# Patient Record
Sex: Female | Born: 1940 | Race: White | Hispanic: Yes | Smoking: Never smoker
Health system: Southern US, Community
[De-identification: ages and names within clinical notes are randomized; demographics above are authoritative.]

## PROBLEM LIST (undated history)

## (undated) DIAGNOSIS — I10 Essential (primary) hypertension: Secondary | ICD-10-CM

## (undated) DIAGNOSIS — E079 Disorder of thyroid, unspecified: Secondary | ICD-10-CM

## (undated) DIAGNOSIS — I5032 Chronic diastolic (congestive) heart failure: Secondary | ICD-10-CM

## (undated) DIAGNOSIS — E785 Hyperlipidemia, unspecified: Secondary | ICD-10-CM

## (undated) DIAGNOSIS — F0390 Unspecified dementia without behavioral disturbance: Secondary | ICD-10-CM

## (undated) DIAGNOSIS — I480 Paroxysmal atrial fibrillation: Secondary | ICD-10-CM

## (undated) HISTORY — DX: Hyperlipidemia, unspecified: E78.5

## (undated) HISTORY — DX: Disorder of thyroid, unspecified: E07.9

## (undated) HISTORY — DX: Essential (primary) hypertension: I10

---

## 2017-10-06 ENCOUNTER — Encounter: Payer: Self-pay | Admitting: Family Medicine

## 2017-11-29 ENCOUNTER — Encounter: Payer: Self-pay | Admitting: Family Medicine

## 2017-11-29 ENCOUNTER — Ambulatory Visit (INDEPENDENT_AMBULATORY_CARE_PROVIDER_SITE_OTHER): Payer: Medicare Other | Admitting: Family Medicine

## 2017-11-29 VITALS — BP 124/56 | Temp 97.4°F | Ht 60.0 in | Wt 94.0 lb

## 2017-11-29 DIAGNOSIS — I10 Essential (primary) hypertension: Secondary | ICD-10-CM

## 2017-11-29 DIAGNOSIS — Z7689 Persons encountering health services in other specified circumstances: Secondary | ICD-10-CM

## 2017-11-29 DIAGNOSIS — F339 Major depressive disorder, recurrent, unspecified: Secondary | ICD-10-CM

## 2017-11-29 DIAGNOSIS — E440 Moderate protein-calorie malnutrition: Secondary | ICD-10-CM

## 2017-11-29 DIAGNOSIS — E785 Hyperlipidemia, unspecified: Secondary | ICD-10-CM

## 2017-11-29 DIAGNOSIS — G309 Alzheimer's disease, unspecified: Secondary | ICD-10-CM

## 2017-11-29 DIAGNOSIS — E039 Hypothyroidism, unspecified: Secondary | ICD-10-CM

## 2017-11-29 DIAGNOSIS — F0280 Dementia in other diseases classified elsewhere without behavioral disturbance: Secondary | ICD-10-CM

## 2017-11-29 MED ORDER — METOPROLOL TARTRATE 50 MG PO TABS: 50.0000 mg | ORAL_TABLET | Freq: Every day | ORAL | 3 refills | Status: DC

## 2017-11-29 MED ORDER — LEVOTHYROXINE SODIUM 88 MCG PO TABS: 88.0000 ug | ORAL_TABLET | Freq: Every day | ORAL | 3 refills | Status: DC

## 2017-11-29 MED ORDER — SERTRALINE HCL 50 MG PO TABS: 50.0000 mg | ORAL_TABLET | Freq: Every day | ORAL | 3 refills | Status: DC

## 2017-11-29 MED ORDER — DONEPEZIL HCL 10 MG PO TABS: 10.0000 mg | ORAL_TABLET | Freq: Every day | ORAL | 3 refills | Status: DC

## 2017-11-29 MED ORDER — MEMANTINE HCL 5 MG PO TABS: 5.0000 mg | ORAL_TABLET | Freq: Two times a day (BID) | ORAL | 3 refills | Status: AC

## 2017-11-29 NOTE — Progress Notes (Signed)
Patient presents to clinic today to follow-up on chronic conditions and establish care.  Pt's primary language is [place].  She understands some [place].  No translator present.  Pt's granddaughter aiding w/ communication at times.  SUBJECTIVE: PMH:  Pt is a 77 yo female with pmh sig for Alzheimer's dementia, hypothyroidism, hyperlipidemia, hypertension, depression, malnutrition.  Pt was previously seen by Dr.Fernande Alesia Richards in [place], [place]  Pt is accompanied by her granddaughter who recently got custody of pt.    Dementia: -Alzheimer's per note from previous PCP -On Aricept and Namenda -Behavior becoming a concern for pt's granddaughter, considering nursing facility placement -Pt eating nonstop, forgetting she just ate -Granddaughter has put a lock on the refrigerator  Malnutrition: -Patient was 80 something pounds when her granddaughter started caring for her. -There was concern for patient not eating while in [place] -Patient now eating all day, as she forgets she is just eaten.  Hypothyroidism: -Taking Synthroid 88 mcg daily  Depression: -Taking sertraline 50 mg daily -Endorses okay mood  HTN: -Taking metoprolol succinate 50 mg daily  Allergies: NKDA  Past Medical History:  Diagnosis Date  . Hyperlipidemia   . Hypertension   . Thyroid disease     History reviewed. No pertinent surgical history.  No current outpatient medications on file prior to visit.   No current facility-administered medications on file prior to visit.     No Known Allergies  Family History  Problem Relation Age of Onset  . Heart disease Mother   . Mental illness Mother   . Stroke Father   . Mental illness Brother   . Heart disease Daughter   . Alcohol abuse Son     Social History   Socioeconomic History  . Marital status: Widowed    Spouse name: Not on file  . Number of children: Not on file  . Years of education: Not on file  . Highest education level:  Not on file  Occupational History  . Not on file  Social Needs  . Financial resource strain: Not on file  . Food insecurity:    Worry: Not on file    Inability: Not on file  . Transportation needs:    Medical: Not on file    Non-medical: Not on file  Tobacco Use  . Smoking status: Never Smoker  . Smokeless tobacco: Never Used  Substance and Sexual Activity  . Alcohol use: Never    Frequency: Never  . Drug use: Never  . Sexual activity: Not Currently  Lifestyle  . Physical activity:    Days per week: Not on file    Minutes per session: Not on file  . Stress: Not on file  Relationships  . Social connections:    Talks on phone: Not on file    Gets together: Not on file    Attends religious service: Not on file    Active member of club or organization: Not on file    Attends meetings of clubs or organizations: Not on file    Relationship status: Not on file  . Intimate partner violence:    Fear of current or ex partner: Not on file    Emotionally abused: Not on file    Physically abused: Not on file    Forced sexual activity: Not on file  Other Topics Concern  . Not on file  Social History Narrative  . Not on file    ROS General: Denies fever, chills, night sweats, changes in  appetite  +memory deficit, decreased weight HEENT: Denies headaches, ear pain, changes in vision, rhinorrhea, sore throat  CV: Denies CP, palpitations, SOB, orthopnea Pulm: Denies SOB, cough, wheezing GI: Denies abdominal pain, nausea, vomiting, diarrhea, constipation GU: Denies dysuria, hematuria, frequency, vaginal discharge Msk: Denies muscle cramps, joint pains Neuro: Denies weakness, numbness, tingling Skin: Denies rashes, bruising Psych: Denies anxiety, hallucinations  + depression  BP (!) 124/56   Temp (!) 97.4 F (36.3 C) (Oral)   Ht 5' (1.524 m)   Wt 94 lb (42.6 kg)   BMI 18.36 kg/m   Physical Exam Gen. Pleasant, well developed, malnourished, in NAD HEENT - /AT, temporal  wasting, PERRL, no scleral icterus, no nasal drainage, pharynx without erythema or exudate.  TMs normal bilaterally.  No cervical lymphadenopathy. Lungs: no use of accessory muscles, CTAB, no wheezes, rales or rhonchi Cardiovascular: RRR, No r/g/m, no peripheral edema Abdomen: BS present, soft, nontender,nondistended Musculoskeletal: Muscle wasting of the dorsum of hands, no deformities, moves all four extremities, no cyanosis or clubbing, normal tone Neuro:  A&Ox3, CN II-XII intact, normal gait Skin:  Warm, dry, intact, no lesions  No results found for this or any previous visit (from the past 2160 hour(s)).  Assessment/Plan:  A handwritten note from patient's previous provider reviewed.  Dr. Delma FreezeFerdinand Marin Rivera.  [facility] [address],  [place], [place] [id number]  [phone]  Alzheimer's dementia without behavioral disturbance, unspecified timing of dementia onset (HCC) -Discussed the importance of his schedule/routine -Will complete FL2 for pt -Given handouts - Plan: donepezil (ARICEPT) 10 MG tablet, memantine (NAMENDA) 5 MG tablet  Acquired hypothyroidism  -Recent labs reviewed and copies made. -Continue Synthroid 88 mcg - Plan: levothyroxine (SYNTHROID, LEVOTHROID) 88 MCG tablet  Hyperlipidemia, unspecified hyperlipidemia type  Essential hypertension - Plan: metoprolol tartrate (LOPRESSOR) 50 MG tablet  Depression, recurrent (HCC)  - Plan: sertraline (ZOLOFT) 50 MG tablet  Moderate protein-calorie malnutrition (HCC) -Discussed giving patient several small meals per day. -Given handout  Encounter to establish care -We reviewed the PMH, PSH, FH, SH, Meds and Allergies. -We provided refills for any medications we will prescribe as needed. -We addressed current concerns per orders and patient instructions. -We have asked for records for pertinent exams, studies, vaccines and notes from previous providers. -We have advised patient to follow up per instructions  below.  Follow-up in the next 1 to 2 months  Abbe AmsterdamShannon Nima Kemppainen, MD

## 2017-11-29 NOTE — Patient Instructions (Addendum)
Gua para cuidadores de [facility] con enfermedad de Alzheimer  Alzheimer Disease Caregiver Guide Alzheimer disease is a brain disease that causes memory loss and changes in behavior. People with Alzheimer disease often have problems paying attention, communicating, and doing routine tasks. The disease gets worse over time, and people with the disease eventually need full-time care. Taking care of someone with Alzheimer disease can be challenging and overwhelming. This guide provides helpful information and tips that can make caring for someone with the disease a little easier. What changes does Alzheimer disease cause? Alzheimer disease causes a person to lose the ability to remember things and make decisions. Memory loss and confusion are usually mild at the start of the disease, but they get more severe over time. Eventually, the person may not recognize friends, family members, or familiar places. Alzheimer disease can also cause hallucinations, changes in behavior, and changes in mood, such as anxiety or anger. The changes can come on suddenly. They may happen in response to something such as:  Pain.  An infection.  Changes in environment, such as changes in temperature or noise.  Overstimulation.  Feeling lost or scared.  Tips for managing symptoms  Be calm and patient.  Give short, simple answers to questions. Long answers can overwhelm and confuse the person.  Avoid correcting the person in a negative way.  Try not to take things personally, even if the person forgets your name. Understand that changes are a part of the disease process.  Do not argue or try to convince the person about a specific point. Doing that may make the person feel more agitated. Tips for reducing frustration  Make appointments and do daily tasks, like bathing and dressing, when the person is at his or her best.  Allow for plenty of time for simple tasks because they may take longer than expected. Take  your time when doing these tasks.  Limit the person's choices. Too many choices can be overwhelming and stressful for the person.  Involve the person in what you are doing.  Keep a daily routine.  Avoid crowds and new situations, if possible.  Use simple words, short sentences, and a calm voice. Only give one direction at a time.  Buy clothes and shoes that are easy to put on and take off.  Organize medications in a pillbox for each day of the week.  Keep a calendar in a central location to remind the person of appointments or other activities.  Ask about respite care resources so that you can have a regular break from the stress of caregiving. Tips for reducing the risk of injury  Keep floors clear of clutter. Remove rugs, magazine racks, and floor lamps.  Keep hallways well-lit, especially at night.  Put a handrail and nonslip mat in the bathtub or shower.  Put childproof locks on cabinets that contain dangerous items, such as medicines, alcohol, guns, toxic cleaning items, sharp tools or utensils, matches, and lighters.  Put locks on doors. Put the locks in places where the person cannot see or reach them easily. This will help ensure that the person does not wander out of the house and get lost.  Be prepared for emergencies. Keep a list of emergency phone numbers and addresses in a convenient area.  Remove car keys and lock garage doors so that the person does not try to get in the car and drive.  A certain type of bracelet may be worn that tracks a person's location and identifies him  or her as having memory problems. This should be worn at all times for safety. Tips for future planning  Discuss financial and legal planning early on in the course of the disease. People with Alzheimer disease will have trouble managing their money as the disease gets worse. Get help from professional advisers regarding financial and legal matters.  Discuss advance directives, safety, and  daily care. Take these steps: ? Create a living will and choose a power of attorney. The person with power of attorney will be able to make decisions for the person with Alzheimer disease when he or she is no longer able to. ? Discuss driving safety and when to stop driving. The person's health care provider can help provide assistance with this decision. ? Discuss the person's living situation. If the person lives alone, make sure he or she is safe. People who live at home may need extra help from home health caregivers, and those who live in a nursing home or care center may need more care. Where to find support: One way to find support is to join a local support group. Advantages of being part of a support group include:  Learning strategies to manage stress.  Sharing experiences with others.  Receiving emotional comfort and support.  Learning about caregiving as the disease progresses.  Knowing what community resources are available and making use of them.  Where to find more information:  Alzheimer's Association: LimitLaws.hu Contact a health care provider if:  The person has a fever.  The person has a sudden change in behavior that does not improve with calming strategies.  The person is unable to manage in his or her current living situation.  The person threatens himself or herself, you, or anyone else.  You are no longer able to care for the person. Summary  Alzheimer disease is a brain disease that causes memory loss and changes in behavior.  People with Alzheimer disease often have problems paying attention, communicating, and doing routine tasks. The disease gets worse over time, and people with the disease eventually need full-time care.  Take steps to reduce the person's risk of injury, and plan for future care.  Taking care of someone with Alzheimer disease can be very challenging and overwhelming. One way to find support during this time is to join a local  support group. This information is not intended to replace advice given to you by your health care provider. Make sure you discuss any questions you have with your health care provider. Document Released: 09/15/2003 Document Revised: 02/05/2016 Document Reviewed: 02/05/2016 Elsevier Interactive Patient Education  2018 [employer].  Alzheimer Disease Alzheimer disease is a brain disease that affects memory, thinking, and behavior. People with Alzheimer disease lose mental abilities, and the disease gets worse over time. Survival with Alzheimer disease ranges from several years to as long as 20 years. What are the causes? This condition develops when a protein called beta-amyloid forms deposits in the brain. It is not known what causes these deposits to form. What increases the risk? This condition is more likely to develop in people who:  Are elderly.  Have a family history of dementia.  Have had a brain injury.  Have heart or blood vessel disease.  Have had a stroke.  Have high blood pressure or high cholesterol.  Have diabetes.  What are the signs or symptoms? Symptoms of this condition happen in three stages, which often overlap. Early stage In this stage, you may continue to be independent.  You may still be able to drive, work, and be social. Symptoms in this stage include:  Minor memory problems, such as forgetting a name or what you read.  Difficulty with: ? Paying attention. ? Communicating. ? Doing familiar tasks. ? Learning new things.  Needing more time to do daily activities.  Anxiety.  Social withdrawal.  Loss of motivation.  Moderate stage In this stage, you will start to need care. This stage usually lasts the longest. Symptoms in this stage include:  Difficulty with expressing thoughts.  Memory loss that affects daily life. This can include forgetting: ? Your address or phone number. ? Events that have happened. ? Parts of your personal history,  like where you went to school.  Confusion about where you are or what time it is.  Difficulty in judging distance.  Changes in personality, mood, and behavior. You may be moody, irritable, angry, frustrated, fearful, anxious, or suspicious.  Poor reasoning and judgment.  Delusions or hallucinations.  Changes in sleep patterns.  Wandering and getting lost.  Severe stage In the final stage, you will need help with your personal care and dailyactivities. Symptoms in this stage include:  Worsening memory loss.  Personality changes.  Loss of awareness of your surroundings.  Changes in physical abilities, including the ability to walk, sit, and swallow.  Difficulty in communicating.  Inability to control the bladder and bowels.  Increasing confusion.  Increasing disruptive behavior.  How is this diagnosed? This condition is diagnosed with an assessment by your health care provider. During this assessment, your health care provider will talk with you and your family, friends, or caregivers about your symptoms. A thorough medical history will be taken, and you will have a physical exam and tests. Tests may include:  Lab tests, such as blood or urine tests.  Imaging tests, such as a CT scan, PET scan, or MRI.  A lumbar puncture. This test involves removing and testing a small amount of the fluid that surrounds the brain and spinal cord.  An electroencephalogram (EEG). In this test, small metal discs are used to measure electrical activity in the brain.  Memory tests, cognitive tests, and neuropsychological tests. These tests evaluate brain function.  How is this treated? At this time, there is no treatment to cure Alzheimer disease or stop it from getting worse. The goals of treatment are:  To slow down the disease.  To manage behavioral problems.  To provide you with a safe environment.  To make life easier for you and your caregivers.  The following treatment  options are available:  Medicines. Medicines may help to slow down memory loss and control behavioral symptoms.  Talk therapy. Talk therapy provides you with education, support, and memory aids. It is most helpful in the early stages of the condition.  Counseling or spiritual guidance. It is normal to have a lot of feelings, including anger, relief, fear, and isolation. Counseling and guidance can help you deal with these feelings.  Caregiving. This involves having caregivers help you with your daily activities. Caregivers may be family members, friends, or trained medical professionals. Caregiving can be done at home or outside the home.  Family support groups. These provide education, emotional support, and information about community resources to family members who are taking care of you.  Follow these instructions at home: Medicines  Take over-the-counter and prescription medicines only as told by your health care provider.  Avoid taking medicines that can affect thinking, such as pain or sleeping medicines.  Lifestyle   Make healthy lifestyle choices: ? Be physically active as told by your health care provider. ? Do not use any tobacco products, such as cigarettes, chewing tobacco, and e-cigarettes. If you need help quitting, ask your health care provider. ? Eat a healthy diet. ? Practice stress-management techniques when you get stressed. ? Stay social.  Drink enough fluid to keep your urine clear or pale yellow.  Make sure to get quality sleep. These tips can help you get a good night's rest: ? Avoid napping during the day. ? Keep your sleeping area dark and cool. ? Avoid exercising during the few hours before you go to bed. ? Avoid caffeine products in the evening. General instructions  Work with your health care provider to determine what you need help with and what your safety needs are.  If you were given a bracelet that tracks your location, make sure to wear  it.  Keep all follow-up visits as told by your health care provider. This is important.  If you have questions or would like additional support, you may contact The Alzheimer's Association: ? 24-hour helpline: 276 514 4663 ? Website: LimitLaws.hu Contact a health care provider if:  You have nausea, vomiting, or trouble with eating.  You have dizziness, or weakness.  You have new or worsening trouble with sleeping.  You or your family members become concerned for your safety. Get help right away if:  You develop chest pain or difficulty with breathing.  You pass out. This information is not intended to replace advice given to you by your health care provider. Make sure you discuss any questions you have with your health care provider. Document Released: 09/15/2003 Document Revised: 09/04/2015 Document Reviewed: 10/01/2014 Elsevier Interactive Patient Education  2018 [employer]. Malnutrition Malnutrition is any condition in which nutrition is poor. There are many forms of malnutrition. A common form is having too little of one kind of nutrient (nutritional deficiency). Nutrients include proteins, minerals, carbohydrates, fats, and vitamins. They provide the body with energy and keep the body working normally. Malnutrition ranges from mild to severe. The condition affects the body's defense system (immune system). Because of this, people who are malnourished are more likely to develop health problems and get sick. What are the causes? Causes of malnutrition include:  Eating an unbalanced diet.  Eating too much of certain foods.  Eating too little.  Conditions that decrease the body's ability to use nutrients.  What increases the risk? Risk factors include:  Pregnancy and lactation. Women who are pregnant may become malnourished if they do not increase their nutrient intake. They are also susceptible to folic acid deficiency.  Increasing age. The body's ability to absorb  nutrients decreases with age. This can contribute to iron, calcium, and vitamin D deficiencies.  Alcohol or drug dependency. Addiction often leads to a lifestyle in which proper nourishment is ignored. Dependency can also hurt the metabolism and the body's ability to absorb nutrients. Alcoholism is a major cause of thiamine deficiency and can lead to deficiencies of magnesium, zinc, and other vitamins.  Eating disorders, such as anorexia nervosa. People with these disorders may eat too little or too much.  Chewing or swallowing problems. People with these disorders may not eat enough.  Certain diseases, including: ? Long-lasting (chronic) diseases. Chronic diseases tend to affect the absorption of calcium, iron, and vitamins B12, A, D, E, and K. ? Liver disease. Liver disease affects the storage of vitamins A and B12. It also interferes with the  metabolism of protein and energy sources. ? Kidney disease. Kidney disease may cause deficiencies of protein, iron, and vitamin D. ? Cancer or AIDS. These diseases can cause a loss of appetite. ? Cystic fibrosis. This disease can make it difficult for the body to absorb nutrients.  Certain diets, including. ? The vegetarian diet. Vegetarians are at risk for iron deficiency. ? The vegan diet. Vegans are susceptible to vitamin B12, calcium, iron, vitamin D, and zinc deficiencies. ? The fruitarian diet. This diet can be deficient in protein, sodium, and many micronutrients. ? Many commercial "fad" diets, including those that claim to enhance well-being and reduce weight. ? Very low calorie diets.  Low income. People with a low income may have trouble paying for nutritious foods.  What are the signs or symptoms? Signs and symptoms depend on the kind of malnutrition you have. Common symptoms include:  Fatigue.  Weakness.  Dizziness.  Fainting  Weight loss.  Poor immune response.  Lack of menstruation.  Hair loss.  Poor memory.  How is  this diagnosed? Malnutrition may be diagnosed by:  A medical history.  A dietary history.  A physical exam. This may include a measurement of your body mass index (BMI).  Blood tests.  How is this treated? Treatments vary depending on the cause of the malnutrition. Common treatments include:  Dietary changes.  Dietary supplements, such as vitamins and minerals.  Treatment of any underlying conditions.  Follow these instructions at home:  Eat a balanced diet.  Take dietary supplements as directed by your health care provider.  Exercise regularly. Exercising can improve appetite.  Keep all follow-up visits as directed by your health care provider. This is important. How is this prevented? Eating a well-balanced diet helps to prevent most forms of malnutrition. Contact a health care provider if:  You have increased weakness or fatigue.  You faint.  You stop menstruating.  You have rapid hair loss.  You have unexpected weight loss. This information is not intended to replace advice given to you by your health care provider. Make sure you discuss any questions you have with your health care provider. Document Released: 11/19/2004 Document Revised: 06/11/2015 Document Reviewed: 08/30/2013 Elsevier Interactive Patient Education  [employer].

## 2017-12-04 ENCOUNTER — Encounter: Payer: Self-pay | Admitting: Family Medicine

## 2017-12-06 ENCOUNTER — Telehealth: Payer: Self-pay | Admitting: Family Medicine

## 2017-12-06 NOTE — Telephone Encounter (Signed)
Pt's granddaughter showed up at office this evening.  Form given to her.

## 2017-12-06 NOTE — Telephone Encounter (Signed)
11/20 10:20am Ann Cole is calling to follow up on FL2 form. She states she called 11/15 and 11/19 and no one has gotten back to her. [facility]. Form is needed before they will assess patient. Please call [phone].   Copied from CRM [phone]. Topic: General - Other >> Dec 05, 2017  9:00 AM Percival SpanishKennedy, Cheryl W wrote:  Kris MoutonGrand daughter calling to follow up on FL2 form

## 2017-12-06 NOTE — Telephone Encounter (Signed)
FYI: Spoke with pt grand daughter, explained that Dr Salomon FickBanks is in the process of completing the FL2 form and that I will be calling her when its complete, voiced understanding

## 2017-12-11 ENCOUNTER — Telehealth: Payer: Self-pay

## 2017-12-11 NOTE — Telephone Encounter (Signed)
Called pt grand daughter left a message that FL2 form is ready for pick up at the office

## 2017-12-11 NOTE — Telephone Encounter (Signed)
Called pt grand daughter [facility]

## 2017-12-13 ENCOUNTER — Telehealth: Payer: Self-pay

## 2017-12-13 NOTE — Telephone Encounter (Signed)
Pt grand[facility]

## 2017-12-22 ENCOUNTER — Ambulatory Visit: Payer: Self-pay | Admitting: *Deleted

## 2017-12-22 NOTE — Telephone Encounter (Addendum)
Granddaughter is calling with decreased BP. [facility]- BP 108/50. Patient was getting a TB skin test reading. Patient has lost a lot of weight. Offered Saturday appointment- then Monday appointment- then ED- do not know what granddaughter is going to do. She states she will call back to schedule. Told her we would be here to take her call.  Reason for Disposition . [1] Systolic BP 90-110 AND [2] taking blood pressure medications AND [3] NOT dizzy, lightheaded or weak  Answer Assessment - Initial Assessment Questions 1. BLOOD PRESSURE: "What is the blood pressure?" "Did you take at least two measurements 5 minutes apart?"     108/50 2. ONSET: "When did you take your blood pressure?"     3:20 pm 3. HOW: "How did you obtain the blood pressure?" (e.g., visiting nurse, automatic home BP monitor)     manual 4. HISTORY: "Do you have a history of low blood pressure?" "What is your blood pressure normally?"     Patient is under treatment for high BP 5. MEDICATIONS: "Are you taking any medications for blood pressure?" If yes: "Have they been changed recently?"     Yes- Metoprolol 50 mg  6. PULSE RATE: "Do you know what your pulse rate is?"      78 7. OTHER SYMPTOMS: "Have you been sick recently?" "Have you had a recent injury?"     No illness 8. PREGNANCY: "Is there any chance you are pregnant?" "When was your last menstrual period?"     n/a  Protocols used: LOW BLOOD PRESSURE-A-AH

## 2017-12-25 NOTE — Telephone Encounter (Signed)
Please advise 

## 2017-12-25 NOTE — Telephone Encounter (Signed)
Pt should increase po intake of fluids.  Offer pt an appt to be seen in clinic.

## 2017-12-27 NOTE — Telephone Encounter (Signed)
Called pt Granddaughter and left a detailed message with Dr Salomon FickBanks recommendations

## 2018-01-05 ENCOUNTER — Ambulatory Visit (INDEPENDENT_AMBULATORY_CARE_PROVIDER_SITE_OTHER): Payer: Medicare Other | Admitting: Family Medicine

## 2018-01-05 ENCOUNTER — Encounter: Payer: Self-pay | Admitting: Family Medicine

## 2018-01-05 VITALS — BP 110/56 | HR 84 | Temp 97.0°F | Wt 108.0 lb

## 2018-01-05 DIAGNOSIS — R600 Localized edema: Secondary | ICD-10-CM | POA: Diagnosis not present

## 2018-01-05 DIAGNOSIS — F0280 Dementia in other diseases classified elsewhere without behavioral disturbance: Secondary | ICD-10-CM | POA: Diagnosis not present

## 2018-01-05 DIAGNOSIS — I10 Essential (primary) hypertension: Secondary | ICD-10-CM | POA: Diagnosis not present

## 2018-01-05 DIAGNOSIS — G309 Alzheimer's disease, unspecified: Secondary | ICD-10-CM

## 2018-01-05 DIAGNOSIS — F0390 Unspecified dementia without behavioral disturbance: Secondary | ICD-10-CM | POA: Insufficient documentation

## 2018-01-05 MED ORDER — METOPROLOL TARTRATE 25 MG PO TABS: 25.0000 mg | ORAL_TABLET | Freq: Every day | ORAL | 3 refills | Status: DC

## 2018-01-05 MED ORDER — METOPROLOL SUCCINATE ER 25 MG PO TB24: 25.0000 mg | ORAL_TABLET | Freq: Every day | ORAL | 3 refills | Status: DC

## 2018-01-05 NOTE — Progress Notes (Signed)
Subjective:    Patient ID: Ann Cole Ann Cole, female    DOB: 1940/08/12, 77 y.o.   MRN: [id number]  No chief complaint on file.   HPI Patient was seen today for f/u on BP.  Pt taking metoprolol 50 mg daily.  Pt's granddaughter mention concern of hypotension.  BP was low when checked at pharmacy, cannot recall the reading.  Pt has been on Metoprolol for yrs.  Pt denies HAs, dizziness, CP, syncope.  Pt has been increasing her po intake.  [facility].  Pt's granddaughter picks her up on the wknds.  Per pt's granddaughter, pt walks around at night which is causing her legs to swell.  Past Medical History:  Diagnosis Date  . Hyperlipidemia   . Hypertension   . Thyroid disease     No Known Allergies  ROS General: Denies fever, chills, night sweats, changes in weight, changes in appetite   +bp concerns HEENT: Denies headaches, ear pain, changes in vision, rhinorrhea, sore throat CV: Denies CP, palpitations, SOB, orthopnea Pulm: Denies SOB, cough, wheezing GI: Denies abdominal pain, nausea, vomiting, diarrhea, constipation GU: Denies dysuria, hematuria, frequency, vaginal discharge Msk: Denies muscle cramps, joint pains  +LE edema Neuro: Denies weakness, numbness, tingling Skin: Denies rashes, bruising Psych: Denies depression, anxiety, hallucinations    Objective:    Blood pressure (!) 110/56, pulse 84, temperature (!) 97 F (36.1 C), weight 108 lb (49 kg), SpO2 98 %.  Gen. Pleasant, thin, in no distress, normal affect  HEENT: [place]/AT, face symmetric,no scleral icterus, PERRLA, nares patent without drainage, pharynx without erythema or exudate. Lungs: no accessory muscle use, CTAB, no wheezes or rales Cardiovascular: RRR, no m/r/g, no peripheral edema Neuro:  A&Ox3, CN II-XII intact, normal gait Skin:  Warm, no lesions/ rash  Wt Readings from Last 3 Encounters:  11/29/17 94 lb (42.6 kg)    No results found for: WBC, HGB, HCT, PLT, GLUCOSE,  CHOL, TRIG, HDL, LDLDIRECT, LDLCALC, ALT, AST, NA, K, CL, CREATININE, BUN, CO2, TSH, PSA, INR, GLUF, HGBA1C, MICROALBUR  Assessment/Plan:  Essential hypertension -given well controlled bp will decrease dose of metoprolol from 50 to 25 mg daily.  - Plan: metoprolol succinate 25 MG tablet daily. -letter noting above given to pt to [facility] -continue checking bp daily.  Bilateral lower extremity edema -Discussed importance of elevating feet when sitting, however this is difficult as patient often forgets 2/2 dementia -Discussed use of TED hose, however will likely be uncomfortable for patient. -Discussed decreasing sodium intake -Continue to monitor  Alzheimer's dementia without behavioral disturbance -Continue Namenda 5 mg twice daily and Aricept 10 mg nightly -Encouraged for patient to have a regular routine  Of note per chart review, pt has been on Metoprolol succinate, not tartrate.  Changes made to med list.  Follow-up in the next month  Abbe AmsterdamShannon Banks, MD

## 2018-01-05 NOTE — Patient Instructions (Signed)
At today's visit we discussed decreasing the dose of metoprolol from 50 to 25 mg daily.

## 2018-01-05 NOTE — Progress Notes (Signed)
Pt brought records on her TB Test results, TB testing was done at [facility] and result was Negative. Copy has been sent to scanning

## 2018-01-14 ENCOUNTER — Encounter: Payer: Self-pay | Admitting: Family Medicine

## 2018-08-01 ENCOUNTER — Emergency Department (HOSPITAL_COMMUNITY): Payer: Medicare Other

## 2018-08-01 ENCOUNTER — Other Ambulatory Visit: Payer: Self-pay

## 2018-08-01 ENCOUNTER — Emergency Department (HOSPITAL_COMMUNITY)
Admission: EM | Admit: 2018-08-01 | Discharge: 2018-08-01 | Disposition: A | Discharge status: Home / Self Care | Payer: Medicare Other | Attending: Emergency Medicine | Admitting: Emergency Medicine

## 2018-08-01 ENCOUNTER — Encounter (HOSPITAL_COMMUNITY): Payer: Self-pay | Admitting: Emergency Medicine

## 2018-08-01 DIAGNOSIS — F0390 Unspecified dementia without behavioral disturbance: Secondary | ICD-10-CM | POA: Diagnosis not present

## 2018-08-01 DIAGNOSIS — Z79899 Other long term (current) drug therapy: Secondary | ICD-10-CM | POA: Diagnosis not present

## 2018-08-01 DIAGNOSIS — R109 Unspecified abdominal pain: Secondary | ICD-10-CM | POA: Insufficient documentation

## 2018-08-01 DIAGNOSIS — I10 Essential (primary) hypertension: Secondary | ICD-10-CM | POA: Diagnosis not present

## 2018-08-01 LAB — URINALYSIS, ROUTINE W REFLEX MICROSCOPIC
Bilirubin Urine: NEGATIVE
Glucose, UA: NEGATIVE mg/dL
Ketones, ur: NEGATIVE mg/dL
Nitrite: NEGATIVE
Protein, ur: NEGATIVE mg/dL
Specific Gravity, Urine: 1.014 (ref 1.005–1.030)
pH: 6 (ref 5.0–8.0)

## 2018-08-01 LAB — COMPREHENSIVE METABOLIC PANEL
ALT: 11 U/L (ref 0–44)
AST: 16 U/L (ref 15–41)
Albumin: 3.4 g/dL — ABNORMAL LOW (ref 3.5–5.0)
Alkaline Phosphatase: 83 U/L (ref 38–126)
Anion gap: 10 (ref 5–15)
BUN: 10 mg/dL (ref 8–23)
CO2: 26 mmol/L (ref 22–32)
Calcium: 9 mg/dL (ref 8.9–10.3)
Chloride: 104 mmol/L (ref 98–111)
Creatinine, Ser: 0.94 mg/dL (ref 0.44–1.00)
GFR calc Af Amer: >60 mL/min (ref >60)
GFR calc non Af Amer: 57 mL/min — ABNORMAL LOW (ref >60)
Glucose, Bld: 90 mg/dL (ref 70–99)
Potassium: 3.8 mmol/L (ref 3.5–5.1)
Sodium: 140 mmol/L (ref 135–145)
Total Bilirubin: 0.5 mg/dL (ref 0.3–1.2)
Total Protein: 7.2 g/dL (ref 6.5–8.1)

## 2018-08-01 LAB — CBC WITH DIFFERENTIAL/PLATELET
Abs Immature Granulocytes: 0.03 10*3/uL (ref 0.00–0.07)
Basophils Absolute: 0 10*3/uL (ref 0.0–0.1)
Basophils Relative: 0 %
Eosinophils Absolute: 0 10*3/uL (ref 0.0–0.5)
Eosinophils Relative: 0 %
HCT: 37.4 % (ref 36.0–46.0)
Hemoglobin: 11.9 g/dL — ABNORMAL LOW (ref 12.0–15.0)
Immature Granulocytes: 0 %
Lymphocytes Relative: 33 %
Lymphs Abs: 2.7 10*3/uL (ref 0.7–4.0)
MCH: 30 pg (ref 26.0–34.0)
MCHC: 31.8 g/dL (ref 30.0–36.0)
MCV: 94.2 fL (ref 80.0–100.0)
Monocytes Absolute: 0.6 10*3/uL (ref 0.1–1.0)
Monocytes Relative: 8 %
Neutro Abs: 4.7 10*3/uL (ref 1.7–7.7)
Neutrophils Relative %: 59 %
Platelets: 309 10*3/uL (ref 150–400)
RBC: 3.97 MIL/uL (ref 3.87–5.11)
RDW: 13.2 % (ref 11.5–15.5)
WBC: 8.1 10*3/uL (ref 4.0–10.5)
nRBC: 0 % (ref 0.0–0.2)

## 2018-08-01 LAB — LIPASE, BLOOD: Lipase: 39 U/L (ref 11–51)

## 2018-08-01 MED ORDER — SODIUM CHLORIDE 0.9 % IV SOLN
INTRAVENOUS | Status: DC
  Administered 2018-08-01: 14:00:00 via INTRAVENOUS

## 2018-08-01 NOTE — ED Notes (Signed)
Interpreter Michelene Heady [phone] assisted with discharge.

## 2018-08-01 NOTE — ED Provider Notes (Addendum)
[place] DEPT Provider Note   CSN: [id number] Arrival date & time: 08/01/18  1223     History   Chief Complaint No chief complaint on file.   HPI Ann Cole is a 78 y.o. female.     78 year old female presents from nursing home with abdominal discomfort without emesis or fever.  She does have history of dementia and when questioned states that the only discomfort she has in her right knee.  She states that she is not falling.  Her knee pain is been there for quite some time.  She denies any urinary symptoms.  No fever or chills.  Is unsure when her last bowel movement was.  Denies any cough or congestion.  Her facility called EMS and she was transported here.  I did ask the patient if she wanted an interpreter and she said no     Past Medical History:  Diagnosis Date  . Hyperlipidemia   . Hypertension   . Thyroid disease     Patient Active Problem List   Diagnosis Date Noted  . Dementia ([place]) 01/05/2018  . Essential hypertension 01/05/2018    No past surgical history on file.   OB History   No obstetric history on file.      Home Medications    Prior to Admission medications   Medication Sig Start Date End Date Taking? Authorizing Provider  donepezil (ARICEPT) 10 MG tablet Take 1 tablet (10 mg total) by mouth at bedtime. 11/29/17   Billie Ruddy, MD  levothyroxine (SYNTHROID, LEVOTHROID) 88 MCG tablet Take 1 tablet (88 mcg total) by mouth daily before breakfast. 11/29/17   Billie Ruddy, MD  memantine (NAMENDA) 5 MG tablet Take 1 tablet (5 mg total) by mouth 2 (two) times daily. 11/29/17   Billie Ruddy, MD  metoprolol succinate (TOPROL-XL) 25 MG 24 hr tablet Take 1 tablet (25 mg total) by mouth daily. 01/05/18   Billie Ruddy, MD  sertraline (ZOLOFT) 50 MG tablet Take 1 tablet (50 mg total) by mouth daily. 11/29/17   Billie Ruddy, MD    Family History Family History  Problem Relation Age of Onset  .  Heart disease Mother   . Mental illness Mother   . Stroke Father   . Mental illness Brother   . Heart disease Daughter   . Alcohol abuse Son     Social History Social History   Tobacco Use  . Smoking status: Never Smoker  . Smokeless tobacco: Never Used  Substance Use Topics  . Alcohol use: Never    Frequency: Never  . Drug use: Never     Allergies   Patient has no known allergies.   Review of Systems Review of Systems  All other systems reviewed and are negative.    Physical Exam Updated Vital Signs There were no vitals taken for this visit.  Physical Exam Vitals signs and nursing note reviewed.  Constitutional:      General: She is not in acute distress.    Appearance: Normal appearance. She is well-developed. She is not toxic-appearing.  HENT:     Head: Normocephalic and atraumatic.  Eyes:     General: Lids are normal.     Conjunctiva/sclera: Conjunctivae normal.     Pupils: Pupils are equal, round, and reactive to light.  Neck:     Musculoskeletal: Normal range of motion and neck supple.     Thyroid: No thyroid mass.     Trachea: No  tracheal deviation.  Cardiovascular:     Rate and Rhythm: Normal rate and regular rhythm.     Heart sounds: Normal heart sounds. No murmur. No gallop.   Pulmonary:     Effort: Pulmonary effort is normal. No respiratory distress.     Breath sounds: Normal breath sounds. No stridor. No decreased breath sounds, wheezing, rhonchi or rales.  Abdominal:     General: Bowel sounds are normal. There is no distension.     Palpations: Abdomen is soft.     Tenderness: There is no abdominal tenderness. There is no rebound.  Musculoskeletal: Normal range of motion.        General: No tenderness.     Right knee: She exhibits normal range of motion, no swelling and no effusion.  Skin:    General: Skin is warm and dry.     Findings: No abrasion or rash.  Neurological:     Mental Status: She is alert and oriented to person, place, and  time.     GCS: GCS eye subscore is 4. GCS verbal subscore is 5. GCS motor subscore is 6.     Cranial Nerves: No cranial nerve deficit.     Sensory: No sensory deficit.  Psychiatric:        Speech: Speech normal.        Behavior: Behavior normal.      ED Treatments / Results  Labs (all labs ordered are listed, but only abnormal results are displayed) Labs Reviewed  URINE CULTURE  CBC WITH DIFFERENTIAL/PLATELET  COMPREHENSIVE METABOLIC PANEL  LIPASE, BLOOD  URINALYSIS, ROUTINE W REFLEX MICROSCOPIC    EKG None  Radiology No results found.  Procedures Procedures (including critical care time)  Medications Ordered in ED Medications  0.9 %  sodium chloride infusion (has no administration in time range)     Initial Impression / Assessment and Plan / ED Course  I have reviewed the triage vital signs and the nursing notes.  Pertinent labs & imaging results that were available during my care of the patient were reviewed by me and considered in my medical decision making (see chart for details).        Patient's labs x-rays are reassuring here.  She is requesting coffee to drink here.  She is had no emesis.  Stable for discharge  Final Clinical Impressions(s) / ED Diagnoses   Final diagnoses:  None    ED Discharge Orders    None       Lorre NickAllen, Cortland Crehan, MD 08/01/18 1505    Lorre NickAllen, Rodgers Likes, MD 08/01/18 1506

## 2018-08-01 NOTE — ED Triage Notes (Signed)
BIB EMS from [facility]. Pt c/o abdominal pain x several days and loss of appetite. Denies vomiting / diarrhea. Pt given Tylenol for fever x several days.

## 2018-08-01 NOTE — ED Notes (Signed)
PTAR and facility called.  

## 2018-08-02 LAB — URINE CULTURE

## 2018-08-23 ENCOUNTER — Inpatient Hospital Stay (HOSPITAL_COMMUNITY)
Admission: EM | Admit: 2018-08-23 | Discharge: 2018-08-25 | DRG: 312 | Disposition: A | Discharge status: Skilled Nursing Facility | Payer: Medicare Other | Source: Skilled Nursing Facility | Attending: Internal Medicine | Admitting: Internal Medicine

## 2018-08-23 ENCOUNTER — Emergency Department (HOSPITAL_COMMUNITY): Payer: Medicare Other

## 2018-08-23 ENCOUNTER — Observation Stay (HOSPITAL_COMMUNITY): Payer: Medicare Other

## 2018-08-23 ENCOUNTER — Other Ambulatory Visit: Payer: Self-pay

## 2018-08-23 ENCOUNTER — Encounter (HOSPITAL_COMMUNITY): Payer: Self-pay | Admitting: Emergency Medicine

## 2018-08-23 DIAGNOSIS — R55 Syncope and collapse: Secondary | ICD-10-CM | POA: Diagnosis not present

## 2018-08-23 DIAGNOSIS — M25561 Pain in right knee: Secondary | ICD-10-CM | POA: Diagnosis not present

## 2018-08-23 DIAGNOSIS — W19XXXA Unspecified fall, initial encounter: Secondary | ICD-10-CM | POA: Diagnosis present

## 2018-08-23 DIAGNOSIS — F0280 Dementia in other diseases classified elsewhere without behavioral disturbance: Secondary | ICD-10-CM | POA: Diagnosis not present

## 2018-08-23 DIAGNOSIS — G309 Alzheimer's disease, unspecified: Secondary | ICD-10-CM

## 2018-08-23 DIAGNOSIS — M25569 Pain in unspecified knee: Secondary | ICD-10-CM

## 2018-08-23 DIAGNOSIS — I480 Paroxysmal atrial fibrillation: Secondary | ICD-10-CM | POA: Diagnosis not present

## 2018-08-23 DIAGNOSIS — E785 Hyperlipidemia, unspecified: Secondary | ICD-10-CM | POA: Diagnosis not present

## 2018-08-23 DIAGNOSIS — Z818 Family history of other mental and behavioral disorders: Secondary | ICD-10-CM

## 2018-08-23 DIAGNOSIS — Z79890 Hormone replacement therapy (postmenopausal): Secondary | ICD-10-CM

## 2018-08-23 DIAGNOSIS — E039 Hypothyroidism, unspecified: Secondary | ICD-10-CM | POA: Diagnosis present

## 2018-08-23 DIAGNOSIS — I959 Hypotension, unspecified: Secondary | ICD-10-CM | POA: Diagnosis present

## 2018-08-23 DIAGNOSIS — R079 Chest pain, unspecified: Secondary | ICD-10-CM

## 2018-08-23 DIAGNOSIS — Z8249 Family history of ischemic heart disease and other diseases of the circulatory system: Secondary | ICD-10-CM

## 2018-08-23 DIAGNOSIS — Z79899 Other long term (current) drug therapy: Secondary | ICD-10-CM | POA: Diagnosis not present

## 2018-08-23 DIAGNOSIS — F0390 Unspecified dementia without behavioral disturbance: Secondary | ICD-10-CM | POA: Diagnosis not present

## 2018-08-23 DIAGNOSIS — U071 COVID-19: Secondary | ICD-10-CM

## 2018-08-23 DIAGNOSIS — I10 Essential (primary) hypertension: Secondary | ICD-10-CM | POA: Diagnosis not present

## 2018-08-23 DIAGNOSIS — Z823 Family history of stroke: Secondary | ICD-10-CM

## 2018-08-23 LAB — URINALYSIS, ROUTINE W REFLEX MICROSCOPIC
Bacteria, UA: NONE SEEN
Bilirubin Urine: NEGATIVE
Glucose, UA: NEGATIVE mg/dL
Ketones, ur: NEGATIVE mg/dL
Leukocytes,Ua: NEGATIVE
Nitrite: NEGATIVE
Protein, ur: NEGATIVE mg/dL
Specific Gravity, Urine: 1.015 (ref 1.005–1.030)
pH: 7 (ref 5.0–8.0)

## 2018-08-23 LAB — CBC WITH DIFFERENTIAL/PLATELET
Abs Immature Granulocytes: 0.09 10*3/uL — ABNORMAL HIGH (ref 0.00–0.07)
Basophils Absolute: 0.1 10*3/uL (ref 0.0–0.1)
Basophils Relative: 1 %
Eosinophils Absolute: 0 10*3/uL (ref 0.0–0.5)
Eosinophils Relative: 0 %
HCT: 39.8 % (ref 36.0–46.0)
Hemoglobin: 12.6 g/dL (ref 12.0–15.0)
Immature Granulocytes: 1 %
Lymphocytes Relative: 9 %
Lymphs Abs: 1.4 10*3/uL (ref 0.7–4.0)
MCH: 29.9 pg (ref 26.0–34.0)
MCHC: 31.7 g/dL (ref 30.0–36.0)
MCV: 94.3 fL (ref 80.0–100.0)
Monocytes Absolute: 1.2 10*3/uL — ABNORMAL HIGH (ref 0.1–1.0)
Monocytes Relative: 8 %
Neutro Abs: 12.5 10*3/uL — ABNORMAL HIGH (ref 1.7–7.7)
Neutrophils Relative %: 81 %
Platelets: 309 10*3/uL (ref 150–400)
RBC: 4.22 MIL/uL (ref 3.87–5.11)
RDW: 13.5 % (ref 11.5–15.5)
WBC: 15.3 10*3/uL — ABNORMAL HIGH (ref 4.0–10.5)
nRBC: 0 % (ref 0.0–0.2)

## 2018-08-23 LAB — COMPREHENSIVE METABOLIC PANEL
ALT: 11 U/L (ref 0–44)
AST: 16 U/L (ref 15–41)
Albumin: 3.4 g/dL — ABNORMAL LOW (ref 3.5–5.0)
Alkaline Phosphatase: 94 U/L (ref 38–126)
Anion gap: 9 (ref 5–15)
BUN: 10 mg/dL (ref 8–23)
CO2: 25 mmol/L (ref 22–32)
Calcium: 9.2 mg/dL (ref 8.9–10.3)
Chloride: 102 mmol/L (ref 98–111)
Creatinine, Ser: 1.02 mg/dL — ABNORMAL HIGH (ref 0.44–1.00)
GFR calc Af Amer: >60 mL/min (ref >60)
GFR calc non Af Amer: 52 mL/min — ABNORMAL LOW (ref >60)
Glucose, Bld: 115 mg/dL — ABNORMAL HIGH (ref 70–99)
Potassium: 3.7 mmol/L (ref 3.5–5.1)
Sodium: 136 mmol/L (ref 135–145)
Total Bilirubin: 0.6 mg/dL (ref 0.3–1.2)
Total Protein: 7.4 g/dL (ref 6.5–8.1)

## 2018-08-23 LAB — C-REACTIVE PROTEIN: CRP: 3.5 mg/dL — ABNORMAL HIGH (ref <1.0)

## 2018-08-23 LAB — LACTATE DEHYDROGENASE: LDH: 129 U/L (ref 98–192)

## 2018-08-23 LAB — D-DIMER, QUANTITATIVE: D-Dimer, Quant: 1.27 ug/mL-FEU — ABNORMAL HIGH (ref 0.00–0.50)

## 2018-08-23 LAB — TROPONIN I (HIGH SENSITIVITY)
Troponin I (High Sensitivity): 5 ng/L (ref <18)
Troponin I (High Sensitivity): 6 ng/L (ref <18)

## 2018-08-23 LAB — MRSA PCR SCREENING: MRSA by PCR: NEGATIVE

## 2018-08-23 LAB — SARS CORONAVIRUS 2 BY RT PCR (HOSPITAL ORDER, PERFORMED IN ~~LOC~~ HOSPITAL LAB): SARS Coronavirus 2: POSITIVE — AB

## 2018-08-23 LAB — FERRITIN: Ferritin: 176 ng/mL (ref 11–307)

## 2018-08-23 MED ORDER — MELATONIN 5 MG PO TABS
5.0000 mg | ORAL_TABLET | Freq: Every day | ORAL | Status: DC
  Administered 2018-08-23 – 2018-08-24 (×2): 5 mg via ORAL
  Filled 2018-08-23 (×3): qty 1

## 2018-08-23 MED ORDER — SODIUM CHLORIDE 0.9 % IV SOLN
INTRAVENOUS | Status: DC
  Administered 2018-08-23: 19:00:00 via INTRAVENOUS

## 2018-08-23 MED ORDER — MAGNESIUM HYDROXIDE 400 MG/5ML PO SUSP: 5.0000 mL | Freq: Every day | ORAL | Status: DC | PRN

## 2018-08-23 MED ORDER — HYDRALAZINE HCL 20 MG/ML IJ SOLN: 5.0000 mg | Freq: Four times a day (QID) | INTRAMUSCULAR | Status: DC | PRN

## 2018-08-23 MED ORDER — BACITRACIN-NEOMYCIN-POLYMYXIN OINTMENT TUBE
1.0000 "application " | TOPICAL_OINTMENT | Freq: Four times a day (QID) | CUTANEOUS | Status: DC
  Administered 2018-08-23 – 2018-08-25 (×7): 1 via TOPICAL
  Filled 2018-08-23: qty 14.17

## 2018-08-23 MED ORDER — ALUM & MAG HYDROXIDE-SIMETH 200-200-20 MG/5ML PO SUSP: 30.0000 mL | Freq: Four times a day (QID) | ORAL | Status: DC | PRN

## 2018-08-23 MED ORDER — LEVOTHYROXINE SODIUM 88 MCG PO TABS
88.0000 ug | ORAL_TABLET | Freq: Every day | ORAL | Status: DC
  Administered 2018-08-24 – 2018-08-25 (×2): 88 ug via ORAL
  Filled 2018-08-23 (×3): qty 1

## 2018-08-23 MED ORDER — DONEPEZIL HCL 10 MG PO TABS
10.0000 mg | ORAL_TABLET | Freq: Every day | ORAL | Status: DC
  Administered 2018-08-23 – 2018-08-24 (×2): 10 mg via ORAL
  Filled 2018-08-23 (×2): qty 1

## 2018-08-23 MED ORDER — ACETAMINOPHEN 500 MG PO TABS
500.0000 mg | ORAL_TABLET | Freq: Four times a day (QID) | ORAL | Status: DC | PRN
  Administered 2018-08-24 (×2): 500 mg via ORAL
  Filled 2018-08-23 (×2): qty 1

## 2018-08-23 MED ORDER — HYDROCODONE-ACETAMINOPHEN 5-325 MG PO TABS: 1.0000 | ORAL_TABLET | Freq: Four times a day (QID) | ORAL | Status: DC | PRN

## 2018-08-23 MED ORDER — MEMANTINE HCL 5 MG PO TABS
5.0000 mg | ORAL_TABLET | Freq: Two times a day (BID) | ORAL | Status: DC
  Administered 2018-08-23 – 2018-08-25 (×4): 5 mg via ORAL
  Filled 2018-08-23 (×6): qty 1

## 2018-08-23 MED ORDER — GUAIFENESIN 100 MG/5ML PO SYRP
200.0000 mg | ORAL_SOLUTION | Freq: Four times a day (QID) | ORAL | Status: DC | PRN
  Filled 2018-08-23: qty 10

## 2018-08-23 MED ORDER — SERTRALINE HCL 50 MG PO TABS
50.0000 mg | ORAL_TABLET | Freq: Every day | ORAL | Status: DC
  Administered 2018-08-24 – 2018-08-25 (×2): 50 mg via ORAL
  Filled 2018-08-23 (×2): qty 1

## 2018-08-23 MED ORDER — SODIUM CHLORIDE 0.9 % IV BOLUS
1000.0000 mL | Freq: Once | INTRAVENOUS | Status: AC
  Administered 2018-08-23: 1000 mL via INTRAVENOUS

## 2018-08-23 MED ORDER — ENOXAPARIN SODIUM 40 MG/0.4ML ~~LOC~~ SOLN
40.0000 mg | SUBCUTANEOUS | Status: DC
  Administered 2018-08-23 – 2018-08-24 (×2): 40 mg via SUBCUTANEOUS
  Filled 2018-08-23 (×2): qty 0.4

## 2018-08-23 MED ORDER — LOPERAMIDE HCL 2 MG PO CAPS: 2.0000 mg | ORAL_CAPSULE | ORAL | Status: DC | PRN

## 2018-08-23 MED ORDER — IOHEXOL 350 MG/ML SOLN
75.0000 mL | Freq: Once | INTRAVENOUS | Status: AC | PRN
  Administered 2018-08-23: 75 mL via INTRAVENOUS

## 2018-08-23 NOTE — ED Notes (Signed)
[facility] at bedside to transport pt to [facility]

## 2018-08-23 NOTE — ED Notes (Signed)
Called pt's granddaughter, Lisabeth Devoid, to inform her of pts condition and that pt will be transported to [facility]. Charlotte Crumb the phone number to [facility] for updates.

## 2018-08-23 NOTE — Progress Notes (Addendum)
Patient with R knee and L hip pain which radiates to L knee, when trying to get up to use bathroom.  Difficult time bearing weight on R kinee especially.  No deformity, does have swelling of R knee.  Mild-mod R knee pain at rest, no L hip pain at rest.  Will get plain film X rays to r/o fracture given the syncope earlier today (which prompted admission).  Alternatively this could just represent her known chronic R knee pain (see X ray in July), she does have dementia and comes to [place] from a memory care unit.

## 2018-08-23 NOTE — ED Triage Notes (Signed)
Pt arrives via EMS from [facility]. Positive Covid test x1 week ago. Per staff, pt has not had symptoms for 3 days. This morning, pt had a large episode of diarrhea per EMS and after breakfast was unresponsive for a brief period. Pt was pale, diaphoretic. Initially complained of some CP. Denied CP for EMS. Initial BP for EMS 70/50. Improved to 150/94 after 250 mL NS. HR 70, CBG 208. Pt is spanish speaking

## 2018-08-23 NOTE — ED Provider Notes (Signed)
[facility] EMERGENCY DEPARTMENT Provider Note   CSN: [id number] Arrival date & time: 08/23/18  [id number]    History   Chief Complaint Chief Complaint  Patient presents with   Hypotension    HPI Ann Cole is a 78 y.o. female.     HPI   [age] year old female with history of hypertension, hyperlipidemia, dementia, recent diagnosis of COVID-19 infection last night, [facility], presents with concern for syncope.  Per EMS, patient was diagnosed with COVID last week, however has been doing well for the last 3 days.  Reports she did have a loose stool this morning.  Reports that she was eating breakfast when she had an episode of unresponsiveness.  It is unclear by history from EMS or the facility how long this occurred, but it was a presumably short period of time.  Initial history from EMS of the facility did not report that patient was having chest pain, however when I discussed patient with the granddaughter, she reports the facility had called and told him that she had had chest pain and sweating followed by an episode of unresponsiveness.    History is limited by language barrier and dementia.  Using Spanish interpreter, patient denies any areas of pain at this time.  Reports that she feels well.  Does not remember why she is here.  Denies nausea, vomiting, diarrhea although the facility had reported an episode this morning.  Denies chest pain or shortness of breath.  Past Medical History:  Diagnosis Date   Hyperlipidemia    Hypertension    Thyroid disease     Patient Active Problem List   Diagnosis Date Noted   Syncope 08/23/2018   Dementia ([facility]) 01/05/2018   Essential hypertension 01/05/2018    History reviewed. No pertinent surgical history.   OB History   No obstetric history on file.      Home Medications    Prior to Admission medications   Medication Sig Start Date End Date Taking? Authorizing  Provider  acetaminophen (TYLENOL) 500 MG tablet Take 500 mg by mouth every 6 (six) hours as needed for mild pain, fever or headache.   Yes [provider]  alum & mag hydroxide-simeth (MINTOX) 200-200-20 MG/5ML suspension Take 30 mLs by mouth every 6 (six) hours as needed for indigestion or heartburn (NOT TO EXCEED 4 DOSES IN 24HRS).   Yes [provider]  cephALEXin (KEFLEX) 500 MG capsule Take 500 mg by mouth 3 (three) times daily. 08/13/18  Yes [provider]  donepezil (ARICEPT) 10 MG tablet Take 1 tablet (10 mg total) by mouth at bedtime. 11/29/17  Yes Deeann SaintBanks, Shannon R, MD  guaifenesin (ROBITUSSIN) 100 MG/5ML syrup Take 200 mg by mouth every 6 (six) hours as needed for cough.   Yes [provider]  levothyroxine (SYNTHROID, LEVOTHROID) 88 MCG tablet Take 1 tablet (88 mcg total) by mouth daily before breakfast. 11/29/17  Yes Deeann SaintBanks, Shannon R, MD  loperamide (IMODIUM) 2 MG capsule Take 2 mg by mouth as needed for diarrhea or loose stools (DO NOT EXCEED 8 DOSES IN 24HRS).   Yes [provider]  magnesium hydroxide (MILK OF MAGNESIA) 400 MG/5ML suspension Take 5 mLs by mouth daily as needed for mild constipation.   Yes [provider]  Melatonin 5 MG TABS Take 5 mg by mouth at bedtime.   Yes [provider]  memantine (NAMENDA) 5 MG tablet Take 1 tablet (5 mg total) by mouth  2 (two) times daily. 11/29/17  Yes Deeann SaintBanks, Shannon R, MD  metoprolol succinate (TOPROL-XL) 25 MG 24 hr tablet Take 1 tablet (25 mg total) by mouth daily. 01/05/18  Yes Deeann SaintBanks, Shannon R, MD  neomycin-bacitracin-polymyxin (NEOSPORIN) [phone] ointment Apply 1 application topically 4 (four) times daily.   Yes [provider]  Nutritional Supplements (NUTRITIONAL SHAKE PO) Take 1 Can by mouth 3 (three) times daily.   Yes [provider]  sertraline (ZOLOFT) 50 MG tablet Take 1 tablet (50 mg total) by mouth daily. 11/29/17  Yes Deeann SaintBanks, Shannon R, MD     Family History Family History  Problem Relation Age of Onset   Heart disease Mother    Mental illness Mother    Stroke Father    Mental illness Brother    Heart disease Daughter    Alcohol abuse Son     Social History Social History   Tobacco Use   Smoking status: Never Smoker   Smokeless tobacco: Never Used  Substance Use Topics   Alcohol use: Never    Frequency: Never   Drug use: Never     Allergies   Patient has no known allergies.   Review of Systems Review of Systems  Unable to perform ROS: Dementia  Constitutional: Negative for appetite change and fever.  HENT: Negative for sore throat.   Eyes: Negative for visual disturbance.  Respiratory: Negative for shortness of breath.   Cardiovascular: Negative for chest pain.  Gastrointestinal: Positive for diarrhea. Negative for abdominal pain.  Genitourinary: Negative for difficulty urinating.  Musculoskeletal: Negative for back pain and neck pain.  Skin: Negative for rash.  Neurological: Negative for headaches.     Physical Exam Updated Vital Signs BP (!) 164/70    Pulse 76    Temp 98 F (36.7 C) (Rectal)    Resp 16    SpO2 100%   Physical Exam Vitals signs and nursing note reviewed.  Constitutional:      General: She is not in acute distress.    Appearance: She is well-developed. She is not diaphoretic.  HENT:     Head: Normocephalic and atraumatic.  Eyes:     Conjunctiva/sclera: Conjunctivae normal.  Neck:     Musculoskeletal: Normal range of motion.  Cardiovascular:     Rate and Rhythm: Normal rate and regular rhythm.     Heart sounds: Normal heart sounds.  Pulmonary:     Effort: Pulmonary effort is normal. No respiratory distress.     Breath sounds: Normal breath sounds.  Abdominal:     General: There is no distension.     Palpations: Abdomen is soft.     Tenderness: There is no abdominal tenderness. There is no guarding.  Musculoskeletal:        General: No tenderness.   Skin:    General: Skin is warm and dry.     Findings: No erythema or rash.  Neurological:     Mental Status: She is alert.     Comments: Oriented to self, location      ED Treatments / Results  Labs (all labs ordered are listed, but only abnormal results are displayed) Labs Reviewed  SARS CORONAVIRUS 2 (HOSPITAL ORDER, PERFORMED IN [facility]) - Abnormal; Notable for the following components:      Result Value   SARS Coronavirus 2 POSITIVE (*)    All other components within normal limits  CBC WITH DIFFERENTIAL/PLATELET - Abnormal; Notable for the following components:   WBC 15.3 (*)  Neutro Abs 12.5 (*)    Monocytes Absolute 1.2 (*)    Abs Immature Granulocytes 0.09 (*)    All other components within normal limits  COMPREHENSIVE METABOLIC PANEL - Abnormal; Notable for the following components:   Glucose, Bld 115 (*)    Creatinine, Ser 1.02 (*)    Albumin 3.4 (*)    GFR calc non Af Amer 52 (*)    All other components within normal limits  URINALYSIS, ROUTINE W REFLEX MICROSCOPIC - Abnormal; Notable for the following components:   Hgb urine dipstick SMALL (*)    All other components within normal limits  URINE CULTURE  C-REACTIVE PROTEIN  D-DIMER, QUANTITATIVE (NOT AT [facility])  LACTATE DEHYDROGENASE  FERRITIN  TROPONIN I (HIGH SENSITIVITY)  TROPONIN I (HIGH SENSITIVITY)    EKG EKG Interpretation  Date/Time:  Thursday August 23 2018 09:36:33 EDT Ventricular Rate:  74 PR Interval:    QRS Duration: 89 QT Interval:  399 QTC Calculation: 443 R Axis:   29 Text Interpretation:  Sinus rhythm No previous ECGs available Confirmed by Gareth Morgan [phone]) on 08/23/2018 10:55:51 AM   Radiology Dg Chest Portable 1 View  Result Date: 08/23/2018 CLINICAL DATA:  Syncope.  Positive COVID-19 EXAM: PORTABLE CHEST 1 VIEW COMPARISON:  None. FINDINGS: Heart size and vascularity normal. Calcified hilar lymph nodes bilaterally. No acute infiltrate or effusion. Lungs  well aerated. IMPRESSION: No active disease. Electronically Signed   By: Franchot Gallo M.D.   On: 08/23/2018 10:45    Procedures Procedures (including critical care time)  Medications Ordered in ED Medications  sodium chloride 0.9 % bolus 1,000 mL (0 mLs Intravenous Stopped 08/23/18 1320)  iohexol (OMNIPAQUE) 350 MG/ML injection 75 mL (75 mLs Intravenous Contrast Given 08/23/18 1615)     Initial Impression / Assessment and Plan / ED Course  I have reviewed the triage vital signs and the nursing notes.  Pertinent labs & imaging results that were available during my care of the patient were reviewed by me and considered in my medical decision making (see chart for details).        78 year old female with history of hypertension, hyperlipidemia, dementia, recent diagnosis of COVID-19 infection last night, residing at the [place] care unit, presents with concern for syncope.  Patient well-appearing on arrival to the emergency department, with normal vital signs.  Labs obtained show no significant findings or etiology of her syncope.  EKG shows a normal sinus rhythm, as does telemetry.  In discussion with daughter, the facility had reported that she had chest pain and diaphoresis with her episode of unresponsiveness.  Given this along with patient's initial EMS blood pressures of the 70s, will admit for syncope observation. Troponin negative.  Given history of COVID with chest pain and syncope, I have ordered a CT PE study which is pending.  She is not hypoxic, tachypneic, and do not feel this result will change her level of care.  She is currently awaiting CT PE study, as well as admission to [facility] for syncope.       Final Clinical Impressions(s) / ED Diagnoses   Final diagnoses:  Syncope, unspecified syncope type  COVID-19 virus infection  Chest pain, unspecified type    ED Discharge Orders    None       Gareth Morgan, MD 08/23/18 [phone]

## 2018-08-23 NOTE — Progress Notes (Signed)
This note also relates to the following rows which could not be included: SpO2 - Cannot attach notes to unvalidated device data  Pt unable to stand for 3 minutes, her knee was hurting her

## 2018-08-23 NOTE — H&P (Signed)
TRH H&P   Patient Demographics:    Ann SamplesCarmen Cole, is a 78 y.o. female  MRN: [id number]   DOB - 1940/10/25  Admit Date - 08/23/2018  Outpatient Primary MD for the patient is Deeann SaintBanks, Shannon R, MD  Referring MD/NP/PA: Dr Dalene SeltzerSchlossman  Patient coming from: SNF  Chief Complaint  Patient presents with   Hypotension      HPI:    Ann Cole  is a 78 y.o. female, with past medical history of hypertension, hyperlipidemia, dementia, from SNF, patient with recent diagnosis of COVID-19 last week, largely has been asymptomatic, patient residing [facility], patient brought to ED for syncope, patient is with dementia, poor historian, history was obtained from ED staff, and facility, currently patient had episode of diarrhea this morning, she became unresponsive, with loss of consciousness, it is unclear how long loss of consciousness has lasted, caution with family, patient had an episode of chest pain, which patient currently denies, but she is demented and history is unreliable, [place] patient had loose bowel movement, then was sweating, which followed by episode of unresponsiveness. -In ED patient saturating 100% on room air, lab abnormalities difficult for leukocytosis at 15.3, otherwise potassium 3.7, creatinine is 1.02, negative urine analysis, CTA chest pending, high-sensitivity troponins negative x2, I was called to admit for evaluation for syncope.     Review of systems:    Patient is with dementia, review of system is unreliable  With Past History of the following :    Past Medical History:  Diagnosis Date   Hyperlipidemia    Hypertension    Thyroid disease       History reviewed. No pertinent surgical history.    Social History:     Social History   Tobacco Use   Smoking status: Never Smoker   Smokeless tobacco: Never Used  Substance Use  Topics   Alcohol use: Never    Frequency: Never      Family History :     Family History  Problem Relation Age of Onset   Heart disease Mother    Mental illness Mother    Stroke Father    Mental illness Brother    Heart disease Daughter    Alcohol abuse Son      Home Medications:   Prior to Admission medications   Medication Sig Start Date End Date Taking? Authorizing Provider  acetaminophen (TYLENOL) 500 MG tablet Take 500 mg by mouth every 6 (six) hours as needed for mild pain, fever or headache.   Yes [provider]  alum & mag hydroxide-simeth (MINTOX) 200-200-20 MG/5ML suspension Take 30 mLs by mouth every 6 (six) hours as needed for indigestion or heartburn (NOT TO EXCEED 4 DOSES IN 24HRS).   Yes [provider]  cephALEXin (KEFLEX) 500 MG capsule Take 500 mg by mouth 3 (three) times daily. 08/13/18  Yes [provider]  donepezil (ARICEPT) 10 MG tablet Take 1 tablet (10 mg total) by mouth at bedtime. 11/29/17  Yes Deeann SaintBanks, Shannon R, MD  guaifenesin (ROBITUSSIN) 100 MG/5ML syrup Take 200 mg by mouth every 6 (six) hours as needed for cough.   Yes [provider]  levothyroxine (SYNTHROID, LEVOTHROID) 88 MCG tablet Take 1 tablet (88 mcg total) by mouth daily before breakfast. 11/29/17  Yes Deeann SaintBanks, Shannon R, MD  loperamide (IMODIUM) 2 MG capsule Take 2 mg by mouth as needed for diarrhea or loose stools (DO NOT EXCEED 8 DOSES IN 24HRS).   Yes [provider]  magnesium hydroxide (MILK OF MAGNESIA) 400 MG/5ML suspension Take 5 mLs by mouth daily as needed for mild constipation.   Yes [provider]  Melatonin 5 MG TABS Take 5 mg by mouth at bedtime.   Yes [provider]  memantine (NAMENDA) 5 MG tablet Take 1 tablet (5 mg total) by mouth 2 (two) times daily. 11/29/17  Yes Deeann SaintBanks, Shannon R, MD  metoprolol succinate (TOPROL-XL) 25 MG 24 hr tablet Take 1 tablet (25 mg total) by mouth daily. 01/05/18  Yes Deeann SaintBanks,  Shannon R, MD  neomycin-bacitracin-polymyxin (NEOSPORIN) [phone] ointment Apply 1 application topically 4 (four) times daily.   Yes [provider]  Nutritional Supplements (NUTRITIONAL SHAKE PO) Take 1 Can by mouth 3 (three) times daily.   Yes [provider]  sertraline (ZOLOFT) 50 MG tablet Take 1 tablet (50 mg total) by mouth daily. 11/29/17  Yes Deeann SaintBanks, Shannon R, MD     Allergies:    No Known Allergies   Physical Exam:   Vitals  Blood pressure (!) 166/56, pulse 76, temperature 98 F (36.7 C), temperature source Rectal, resp. rate 20, SpO2 100 %.   1. General elderly female, laying in bed, in no apparent distress  2.  Pleasantly demented, conversant, no commands, answering questions, mildly confused   3. No F.N deficits, ALL C.Nerves Intact, Strength 5/5 all 4 extremities, Sensation intact all 4 extremities, Plantars down going.  4. Ears and Eyes appear Normal, Conjunctivae clear, PERRLA. Moist Oral Mucosa.  5. Supple Neck, No JVD, No cervical lymphadenopathy appriciated, No Carotid Bruits.  6. Symmetrical Chest wall movement, Good air movement bilaterally, CTAB.  7. RRR, No Gallops, Rubs or Murmurs, No Parasternal Heave.  8. Positive Bowel Sounds, Abdomen Soft, No tenderness, No organomegaly appriciated,No rebound -guarding or rigidity.  9.  No Cyanosis, Normal Skin Turgor, No Skin Rash or Bruise.  10. Good muscle tone,  joints appear normal , no effusions, Normal ROM.  11. No Palpable Lymph Nodes in Neck or Axillae    Data Review:    CBC Recent Labs  Lab 08/23/18 0951  WBC 15.3*  HGB 12.6  HCT 39.8  PLT 309  MCV 94.3  MCH 29.9  MCHC 31.7  RDW 13.5  LYMPHSABS 1.4  MONOABS 1.2*  EOSABS 0.0  BASOSABS 0.1   ------------------------------------------------------------------------------------------------------------------  Chemistries  Recent Labs  Lab 08/23/18 0951  NA 136  K 3.7  CL 102  CO2 25  GLUCOSE 115*  BUN 10    CREATININE 1.02*  CALCIUM 9.2  AST 16  ALT 11  ALKPHOS 94  BILITOT 0.6   ------------------------------------------------------------------------------------------------------------------ CrCl cannot be calculated (Unknown ideal weight.). ------------------------------------------------------------------------------------------------------------------ No results for input(s): TSH, T4TOTAL, T3FREE, THYROIDAB in the last 72 hours.  Invalid input(s): FREET3  Coagulation profile No results for input(s): INR, PROTIME in the last 168 hours. ------------------------------------------------------------------------------------------------------------------- No results for input(s): DDIMER in the  last 72 hours. -------------------------------------------------------------------------------------------------------------------  Cardiac Enzymes No results for input(s): CKMB, TROPONINI, MYOGLOBIN in the last 168 hours.  Invalid input(s): CK ------------------------------------------------------------------------------------------------------------------ No results found for: BNP   ---------------------------------------------------------------------------------------------------------------  Urinalysis    Component Value Date/Time   COLORURINE YELLOW 08/23/2018 1002   APPEARANCEUR CLEAR 08/23/2018 1002   LABSPEC 1.015 08/23/2018 1002   PHURINE 7.0 08/23/2018 1002   GLUCOSEU NEGATIVE 08/23/2018 1002   HGBUR SMALL (A) 08/23/2018 1002   BILIRUBINUR NEGATIVE 08/23/2018 1002   KETONESUR NEGATIVE 08/23/2018 1002   PROTEINUR NEGATIVE 08/23/2018 1002   NITRITE NEGATIVE 08/23/2018 1002   LEUKOCYTESUR NEGATIVE 08/23/2018 1002    ----------------------------------------------------------------------------------------------------------------   Imaging Results:    Ct Angio Chest Pe W And/or Wo Contrast  Result Date: 08/23/2018 CLINICAL DATA:  Covid positive, shortness of breath suspect PE  EXAM: CT ANGIOGRAPHY CHEST WITH CONTRAST TECHNIQUE: Multidetector CT imaging of the chest was performed using the standard protocol during bolus administration of intravenous contrast. Multiplanar CT image reconstructions and MIPs were obtained to evaluate the vascular anatomy. CONTRAST:  75mL OMNIPAQUE IOHEXOL 350 MG/ML SOLN COMPARISON:  Radiograph August 23, 2018. FINDINGS: Cardiovascular: There is a optimal opacification of the pulmonary arteries. There is no central,segmental, or subsegmental filling defects within the pulmonary arteries. There is mild cardiomegaly. No pericardial effusion is seen. No evidence right heart strain. There is normal three-vessel brachiocephalic anatomy without proximal stenosis. Scattered aortic atherosclerotic calcifications are seen without aneurysmal dilatation. Mediastinum/Nodes: Small calcified subcarinal and bilateral hilar lymph nodes are seen, which could be from prior granulomatous disease. Thyroid gland, trachea, and esophagus demonstrate no significant findings. Lungs/Pleura: Calcified granuloma seen at the right lung base. No focal airspace consolidation. No pleural effusion or pneumothorax. Upper Abdomen: No acute abnormalities present in the visualized portions of the upper abdomen. Atherosclerotic calcification within the aorta. Musculoskeletal: No chest wall abnormality. No acute or significant osseous findings. Degenerative changes are seen throughout the thoracic spine. There is diffuse osteopenia. Review of the MIP images confirms the above findings. IMPRESSION: 1. No central, segmental, or subsegmental pulmonary embolism. 2. No acute intrathoracic process to explain the patient's symptoms. Electronically Signed   By: Jonna ClarkBindu  Avutu M.D.   On: 08/23/2018 17:05   Dg Chest Portable 1 View  Result Date: 08/23/2018 CLINICAL DATA:  Syncope.  Positive COVID-19 EXAM: PORTABLE CHEST 1 VIEW COMPARISON:  None. FINDINGS: Heart size and vascularity normal. Calcified hilar  lymph nodes bilaterally. No acute infiltrate or effusion. Lungs well aerated. IMPRESSION: No active disease. Electronically Signed   By: Marlan Palauharles  Clark M.D.   On: 08/23/2018 10:45    My personal review of EKG: Rhythm NSR, Rate  74 /min, QTc 443 , no Acute ST changes   Assessment & Plan:    Active Problems:   Dementia (HCC)   Essential hypertension   Syncope   COVID-19 virus infection   Syncope -This is very likely vasovagal syncope in the setting of patient having large bowel movement and then losing consciousness. -Was a questionable complaints of chest pain preceding syncope, sensitivity troponins negative x2, CTA chest was obtained with no evidence of PE. -She will be admitted overnight where she will be kept on telemetry, and gentle hydration, no significant apneas or pauses, she can be discharged back to SNF in a.m..  COVID 19 infection -She tested positive last week, she denies any complaints, no hypoxia, finding on imaging, no indication for any treatment yet. -will Check D-dimers, CRP, LDH and written and will trend.  Hypothyroidism -Continue with Synthroid  Hypertension -Blood pressure is elevated in ED, she is not on any medications, will keep on PRN hydralazine  Dementia -Continue with home medication    DVT Prophylaxis  Lovenox - SCDs   AM Labs Ordered, also please review Full Orders  Family Communication: Admission, patients condition and plan of care including tests being ordered have been discussed with next of kin her granddaughter Lisabeth Devoid who indicate understanding and agree with the plan and Code Status.  Code Status Full code  Likely DC to  SNF  Condition GUARDED    Consults called: None  Admission status:  observation  Time spent in minutes : 55 minutes   Phillips Climes M.D on 08/23/2018 at 5:24 PM  Between 7am to 7pm - Pager - [phone]. After 7pm go to [email or web address] - password [facility]  [facility] - Office  [phone]

## 2018-08-24 ENCOUNTER — Observation Stay (HOSPITAL_COMMUNITY): Payer: Medicare Other

## 2018-08-24 ENCOUNTER — Other Ambulatory Visit: Payer: Self-pay

## 2018-08-24 DIAGNOSIS — E039 Hypothyroidism, unspecified: Secondary | ICD-10-CM | POA: Diagnosis present

## 2018-08-24 DIAGNOSIS — Z818 Family history of other mental and behavioral disorders: Secondary | ICD-10-CM | POA: Diagnosis not present

## 2018-08-24 DIAGNOSIS — Z8249 Family history of ischemic heart disease and other diseases of the circulatory system: Secondary | ICD-10-CM | POA: Diagnosis not present

## 2018-08-24 DIAGNOSIS — I4891 Unspecified atrial fibrillation: Secondary | ICD-10-CM | POA: Diagnosis not present

## 2018-08-24 DIAGNOSIS — I480 Paroxysmal atrial fibrillation: Secondary | ICD-10-CM

## 2018-08-24 DIAGNOSIS — M25561 Pain in right knee: Secondary | ICD-10-CM | POA: Diagnosis present

## 2018-08-24 DIAGNOSIS — I959 Hypotension, unspecified: Secondary | ICD-10-CM | POA: Diagnosis present

## 2018-08-24 DIAGNOSIS — W19XXXA Unspecified fall, initial encounter: Secondary | ICD-10-CM | POA: Diagnosis present

## 2018-08-24 DIAGNOSIS — Z79899 Other long term (current) drug therapy: Secondary | ICD-10-CM | POA: Diagnosis not present

## 2018-08-24 DIAGNOSIS — U071 COVID-19: Secondary | ICD-10-CM | POA: Diagnosis present

## 2018-08-24 DIAGNOSIS — I10 Essential (primary) hypertension: Secondary | ICD-10-CM | POA: Diagnosis present

## 2018-08-24 DIAGNOSIS — R55 Syncope and collapse: Principal | ICD-10-CM

## 2018-08-24 DIAGNOSIS — F0390 Unspecified dementia without behavioral disturbance: Secondary | ICD-10-CM | POA: Diagnosis present

## 2018-08-24 DIAGNOSIS — Z79890 Hormone replacement therapy (postmenopausal): Secondary | ICD-10-CM | POA: Diagnosis not present

## 2018-08-24 DIAGNOSIS — E785 Hyperlipidemia, unspecified: Secondary | ICD-10-CM | POA: Diagnosis present

## 2018-08-24 DIAGNOSIS — G309 Alzheimer's disease, unspecified: Secondary | ICD-10-CM | POA: Diagnosis not present

## 2018-08-24 DIAGNOSIS — Z823 Family history of stroke: Secondary | ICD-10-CM | POA: Diagnosis not present

## 2018-08-24 LAB — CBC
HCT: 33.5 % — ABNORMAL LOW (ref 36.0–46.0)
Hemoglobin: 10.5 g/dL — ABNORMAL LOW (ref 12.0–15.0)
MCH: 29.1 pg (ref 26.0–34.0)
MCHC: 31.3 g/dL (ref 30.0–36.0)
MCV: 92.8 fL (ref 80.0–100.0)
Platelets: 305 10*3/uL (ref 150–400)
RBC: 3.61 MIL/uL — ABNORMAL LOW (ref 3.87–5.11)
RDW: 13.5 % (ref 11.5–15.5)
WBC: 10.4 10*3/uL (ref 4.0–10.5)
nRBC: 0 % (ref 0.0–0.2)

## 2018-08-24 LAB — BASIC METABOLIC PANEL
Anion gap: 8 (ref 5–15)
BUN: 13 mg/dL (ref 8–23)
CO2: 25 mmol/L (ref 22–32)
Calcium: 9 mg/dL (ref 8.9–10.3)
Chloride: 106 mmol/L (ref 98–111)
Creatinine, Ser: 0.85 mg/dL (ref 0.44–1.00)
GFR calc Af Amer: >60 mL/min (ref >60)
GFR calc non Af Amer: >60 mL/min (ref >60)
Glucose, Bld: 94 mg/dL (ref 70–99)
Potassium: 3.4 mmol/L — ABNORMAL LOW (ref 3.5–5.1)
Sodium: 139 mmol/L (ref 135–145)

## 2018-08-24 LAB — URINE CULTURE: Culture: NO GROWTH

## 2018-08-24 LAB — TSH: TSH: 2.1 u[IU]/mL (ref 0.350–4.500)

## 2018-08-24 LAB — MAGNESIUM: Magnesium: 1.9 mg/dL (ref 1.7–2.4)

## 2018-08-24 MED ORDER — METOPROLOL TARTRATE 5 MG/5ML IV SOLN: 2.5000 mg | Freq: Four times a day (QID) | INTRAVENOUS | Status: DC | PRN

## 2018-08-24 MED ORDER — POTASSIUM CHLORIDE CRYS ER 20 MEQ PO TBCR
20.0000 meq | EXTENDED_RELEASE_TABLET | Freq: Once | ORAL | Status: AC
  Administered 2018-08-24: 20 meq via ORAL
  Filled 2018-08-24: qty 1

## 2018-08-24 MED ORDER — HYDROCODONE-ACETAMINOPHEN 5-325 MG PO TABS
1.0000 | ORAL_TABLET | Freq: Four times a day (QID) | ORAL | Status: DC | PRN
  Administered 2018-08-24 – 2018-08-25 (×2): 1 via ORAL
  Filled 2018-08-24 (×2): qty 1

## 2018-08-24 MED ORDER — POTASSIUM CHLORIDE CRYS ER 20 MEQ PO TBCR
40.0000 meq | EXTENDED_RELEASE_TABLET | Freq: Once | ORAL | Status: AC
  Administered 2018-08-24: 40 meq via ORAL
  Filled 2018-08-24: qty 2

## 2018-08-24 MED ORDER — METOPROLOL SUCCINATE ER 25 MG PO TB24
25.0000 mg | ORAL_TABLET | Freq: Every day | ORAL | Status: DC
  Administered 2018-08-24: 25 mg via ORAL
  Filled 2018-08-24 (×2): qty 1

## 2018-08-24 MED ORDER — ASPIRIN EC 325 MG PO TBEC
325.0000 mg | DELAYED_RELEASE_TABLET | Freq: Every day | ORAL | Status: DC
  Administered 2018-08-24 – 2018-08-25 (×2): 325 mg via ORAL
  Filled 2018-08-24 (×4): qty 1

## 2018-08-24 NOTE — Evaluation (Addendum)
Occupational Therapy Evaluation Patient Details Name: Ann Cole MRN: [id number] DOB: September 24, 1940 Today's Date: 08/24/2018    History of Present Illness 78 y.o.female,with past medical history of hypertension, hyperlipidemia, dementia, from SNF, patient with recent diagnosis of COVID-19 last week,largely has been asymptomatic, patient residing at [facility] care unit.  Patient was brought in due to syncope, suspect possible due to vasovagal episode.   Clinical Impression   This 78 y/o female presents with the above. PTA pt residing in memory care unit of SNF. Pt pleasantly confused this session, reporting to therapists she lives at home and does her own cooking, cleaning, etc. Pt does have some limitations due to R knee pain but was able to tolerate room level and hallway level mobility today, using RW with overall minA. Pt currently requires minA for UB ADL, at least modA for LB ADL. Overall she was able to follow simple commands and communicate her needs. She will benefit from continued acute OT services and recommend pt return to memory care unit at [facility] when appropriate for discharge. Will follow.   [employer] interpreter used, Caren Griffins, during session.     Follow Up Recommendations  SNF;Supervision/Assistance - 24 hour(return to memory care unit)    Equipment Recommendations  Other (comment)(defer to next venue)           Precautions / Restrictions Precautions Precautions: Fall Precaution Comments: R knee pain d/t recent fall Restrictions Weight Bearing Restrictions: No      Mobility Bed Mobility               General bed mobility comments: received OOB in recliner  Transfers Overall transfer level: Needs assistance Equipment used: Rolling walker (2 wheeled) Transfers: Sit to/from Stand Sit to Stand: Min assist         General transfer comment: assist to rise and steady at RW    Balance Overall balance assessment: Needs  assistance Sitting-balance support: Feet supported Sitting balance-Leahy Scale: Good     Standing balance support: Bilateral upper extremity supported;During functional activity Standing balance-Leahy Scale: Poor Standing balance comment: requires external assist for standing balance                           ADL either performed or assessed with clinical judgement   ADL Overall ADL's : Needs assistance/impaired Eating/Feeding: Set up;Sitting   Grooming: Minimal assistance;Standing Grooming Details (indicate cue type and reason): pt stopping to blow her nose x2 with mobility today, minA for standing balance Upper Body Bathing: Minimal assistance;Sitting   Lower Body Bathing: Minimal assistance;Moderate assistance;Sit to/from stand   Upper Body Dressing : Minimal assistance;Sitting   Lower Body Dressing: Moderate assistance;Sit to/from stand   Toilet Transfer: Minimal assistance;Ambulation;RW Toilet Transfer Details (indicate cue type and reason): simulated via transfer to/from recliner, room and hallway mobility, declined need to toilet this session Toileting- Clothing Manipulation and Hygiene: Moderate assistance;Sit to/from stand       Functional mobility during ADLs: Minimal assistance;Rolling walker       Vision         Perception     Praxis      Pertinent Vitals/Pain Pain Assessment: Faces Faces Pain Scale: Hurts little more Pain Location: R knee intermittently, states it hurts "a lot" but able to ambulate in hall without significant difficulty Pain Descriptors / Indicators: Discomfort;Guarding;Grimacing Pain Intervention(s): Monitored during session;Limited activity within patient's tolerance;Ice applied     Hand Dominance     Extremity/Trunk Assessment  Upper Extremity Assessment Upper Extremity Assessment: Generalized weakness   Lower Extremity Assessment Lower Extremity Assessment: Defer to PT evaluation       Communication  Communication Communication: Prefers language other than English(Spanish, some English)   Cognition Arousal/Alertness: Awake/alert Behavior During Therapy: WFL for tasks assessed/performed Overall Cognitive Status: History of cognitive impairments - at baseline                                 General Comments: pt with hx of dementia, very pleasantly confused; thinks she is in [place], states she lives at home and does all of her own cooking, cleaning, etc    General Comments       Exercises     Shoulder Instructions      Home Living Family/patient expects to be discharged to:: Skilled nursing facility                                 Additional Comments: per chart [facility]       Prior Functioning/Environment Level of Independence: Needs assistance  Gait / Transfers Assistance Needed: pt reports she was not walking with AD ADL's / Homemaking Assistance Needed: suspect some assist for ADL given hx of dementia            OT Problem List: Decreased strength;Decreased range of motion;Decreased activity tolerance;Impaired balance (sitting and/or standing);Decreased cognition;Decreased knowledge of use of DME or AE;Decreased safety awareness;Pain      OT Treatment/Interventions: Self-care/ADL training;Therapeutic exercise;DME and/or AE instruction;Therapeutic activities;Cognitive remediation/compensation;Patient/family education;Balance training    OT Goals(Current goals can be found in the care plan section) Acute Rehab OT Goals Patient Stated Goal: none stated, agreeable to working with therapies OT Goal Formulation: With patient Time For Goal Achievement: 09/07/18 Potential to Achieve Goals: Good  OT Frequency: Min 2X/week   Barriers to D/C:            Co-evaluation              AM-PAC OT "6 Clicks" Daily Activity     Outcome Measure Help from another person eating meals?: None Help  from another person taking care of personal grooming?: A Little Help from another person toileting, which includes using toliet, bedpan, or urinal?: A Lot Help from another person bathing (including washing, rinsing, drying)?: A Lot Help from another person to put on and taking off regular upper body clothing?: A Little Help from another person to put on and taking off regular lower body clothing?: A Lot 6 Click Score: 16   End of Session Equipment Utilized During Treatment: Gait belt;Rolling walker Nurse Communication: Mobility status  Activity Tolerance: Patient tolerated treatment well Patient left: in chair;with call bell/phone within reach;with chair alarm set  OT Visit Diagnosis: Unsteadiness on feet (R26.81);Muscle weakness (generalized) (M62.81);Pain Pain - Right/Left: Right Pain - part of body: Knee                Time: [id number] OT Time Calculation (min): 26 min Charges:  OT General Charges $OT Visit: 1 Visit OT Evaluation $OT Eval Moderate Complexity: 1 Mod  Marcy SirenBreanna Shardea Cwynar, OT [employer] [facility] Pager [phone] Office [phone]   Orlando PennerBreanna L Arali Somera 08/24/2018, 4:42 PM

## 2018-08-24 NOTE — Progress Notes (Addendum)
PROGRESS NOTE  Ann FarberCarmen Matilde Cole ZOX:[id number] DOB: 07-20-1940 DOA: 08/23/2018  PCP: Deeann SaintBanks, Shannon R, MD  Brief History/Interval Summary: 78 y.o. female, with past medical history of hypertension, hyperlipidemia, dementia, from SNF, patient with recent diagnosis of COVID-19 last week, largely has been asymptomatic, patient residing [facility].  Patient was brought in due to syncope.  She apparently lost consciousness after she had episode of diarrhea on the morning of admission.  She was hospitalized for further management.  Over the course of the night last night she had atrial fibrillation with RVR.  Reason for Visit: Atrial fibrillation with RVR.  COVID-19.  Syncope.  Consultants: None  Procedures: Echocardiogram is pending  Antibiotics: Anti-infectives (From admission, onward)   None       Subjective/Interval History: Interpreter services were utilized.  Patient is pleasantly confused.  She thinks that she is in [place].  She does not know the year or the month.  Unable to obtain much information from her.  However she denies any pain in her body.  Specifically denies any chest pain.    Assessment/Plan:   Syncope This apparently occurred after she had a large bowel movement.  Most likely a vasovagal episode.  Her EKG did not show any concerning findings.  Troponins were negative.  CT angiogram was negative for PE.  Patient was admitted to the hospital.  Overnight she did have an episode of atrial fibrillation with RVR.  Unclear if this might have contributed to her syncope.  Echocardiogram has been ordered.  TSH was normal.  Atrial fibrillation with RVR, paroxysmal Patient had episode of atrial fibrillation with RVR overnight.  Spontaneously converted to sinus rhythm.  No known previous history of atrial fibrillation.  Patient's chads 2 vascular score is 4.  However patient with history of dementia and lives in a memory care unit.  Will discuss  with family however it appears that anticoagulation in her case would fever more of a risk than benefit.  Atrial fibrillation was brief.  We will put her on beta-blockers.  She is actually on metoprolol at home which will be continued.  She will be placed on aspirin for now.  Follow-up on echocardiogram.  Replace potassium.  Magnesium was 1.9.  ADDENDUM: Discussed with her granddaughter who is the primary next of kin.  Discussed pros and cons and risks and benefits of anticoagulation.  She chooses not to anticoagulate her grandmother at this time.  This is quite reasonable in this patient with dementia.  We will place her on aspirin.  COVID-19 infection She apparently tested positive last week at the skilled nursing facility.  Has not had any symptoms.  She appears to be asymptomatic here as well.  She is afebrile.  She is saturating normal on room air.  No need for COVID-19 specific treatments at this time.  Continue to monitor.  Her CRP was mildly elevated at 3.5.  D-dimer 1.27.  As discussed above CT angiogram was negative for PE.  History of hypothyroidism Continue with the levothyroxine.  TSH was normal.  Essential hypertension Blood pressure was noted to be elevated in the ED.  Home medication list does show metoprolol which will be continued.  History of dementia Now she is pleasantly confused.  Disoriented.  Continue with home medications.  Is on Aricept and Namenda.  Also noted to be on Zoloft.  Right knee pain Right knee noted to be mildly swollen compared to the left.  No effusion noted.  Restricted  range of motion.  Imaging studies do not show any fracture.  Probably related to her fall   DVT Prophylaxis: 40 mg subcu daily Code Status: Full code Family Communication: We will discuss with her family Disposition Plan: She is from a skilled nursing facility.  Had atrial fibrillation overnight.  Will need to be monitored for another 24 hours.   Medications:  Scheduled: . donepezil   10 mg Oral QHS  . enoxaparin (LOVENOX) injection  40 mg Subcutaneous Q24H  . levothyroxine  88 mcg Oral QAC breakfast  . Melatonin  5 mg Oral QHS  . memantine  5 mg Oral BID  . neomycin-bacitracin-polymyxin  1 application Topical QID  . sertraline  50 mg Oral Daily   Continuous:  ZOX:[id number]:acetaminophen, alum & mag hydroxide-simeth, guaifenesin, hydrALAZINE, loperamide, magnesium hydroxide   Objective:  Vital Signs  Vitals:   08/23/18 2019 08/23/18 2020 08/24/18 0434 08/24/18 0802  BP:  98/80 118/67   Pulse:  92    Resp:  (!) 29    Temp: 98.6 F (37 C)  98.9 F (37.2 C) 98.2 F (36.8 C)  TempSrc: Oral  Oral Oral  SpO2:  100%      Intake/Output Summary (Last 24 hours) at 08/24/2018 0945 Last data filed at 08/24/2018 [id number] Gross per 24 hour  Intake 1620 ml  Output 500 ml  Net 1120 ml   There were no vitals filed for this visit.  General appearance: Awake alert.  In no distress.  Pleasantly confused Resp: Clear to auscultation bilaterally.  Normal effort Cardio: S1-S2 is normal regular.  No S3-S4.  No rubs murmurs or bruit.  Telemetry currently shows sinus rhythm.  Atrial fibrillation overnight. GI: Abdomen is soft.  Nontender nondistended.  Bowel sounds are present normal.  No masses organomegaly Extremities: No edema.  Full range of motion of lower extremities. Neurologic: Alert and oriented x3.  No focal neurological deficits.    Lab Results:  Data Reviewed: I have personally reviewed following labs and imaging studies  CBC: Recent Labs  Lab 08/23/18 0951 08/24/18 0117  WBC 15.3* 10.4  NEUTROABS 12.5*  --   HGB 12.6 10.5*  HCT 39.8 33.5*  MCV 94.3 92.8  PLT 309 305    Basic Metabolic Panel: Recent Labs  Lab 08/23/18 0951 08/24/18 0117  NA 136 139  K 3.7 3.4*  CL 102 106  CO2 25 25  GLUCOSE 115* 94  BUN 10 13  CREATININE 1.02* 0.85  CALCIUM 9.2 9.0  MG  --  1.9    GFR: CrCl cannot be calculated (Unknown ideal weight.).  Liver Function Tests:  Recent Labs  Lab 08/23/18 0951  AST 16  ALT 11  ALKPHOS 94  BILITOT 0.6  PROT 7.4  ALBUMIN 3.4*    Thyroid Function Tests: Recent Labs    08/24/18 0117  TSH 2.100    Anemia Panel: Recent Labs    08/23/18 1748  FERRITIN 176    Recent Results (from the past 240 hour(s))  Urine culture     Status: None   Collection Time: 08/23/18 10:02 AM   Specimen: Urine, Random  Result Value Ref Range Status   Specimen Description URINE, RANDOM  Final   Special Requests NONE  Final   Culture   Final    NO GROWTH Performed at [facility], [address] [place], [place] [address]    Report Status 08/24/2018 FINAL  Final  SARS Coronavirus 2 [facility](Hospital order, Performed in [facility] hospital  lab) Nasopharyngeal Nasopharyngeal Swab     Status: Abnormal   Collection Time: 08/23/18  1:20 PM   Specimen: Nasopharyngeal Swab  Result Value Ref Range Status   SARS Coronavirus 2 POSITIVE (A) NEGATIVE Final    Comment: RESULT CALLED TO, READ BACK BY AND VERIFIED WITH: Shirline Frees RN 15:10 08/23/18 ([email or web address]) (NOTE) If result is NEGATIVE SARS-CoV-2 target nucleic acids are NOT DETECTED. The SARS-CoV-2 RNA is generally detectable in upper and lower  respiratory specimens during the acute phase of infection. The lowest  concentration of SARS-CoV-2 viral copies this assay can detect is 250  copies / mL. A negative result does not preclude SARS-CoV-2 infection  and should not be used as the sole basis for treatment or other  patient management decisions.  A negative result may occur with  improper specimen collection / handling, submission of specimen other  than nasopharyngeal swab, presence of viral mutation(s) within the  areas targeted by this assay, and inadequate number of viral copies  (<250 copies / mL). A negative result must be combined with clinical  observations, patient history, and epidemiological information. If result is POSITIVE SARS-CoV-2 target nucleic acids are  DETECTED. T he SARS-CoV-2 RNA is generally detectable in upper and lower  respiratory specimens during the acute phase of infection.  Positive  results are indicative of active infection with SARS-CoV-2.  Clinical  correlation with patient history and other diagnostic information is  necessary to determine patient infection status.  Positive results do  not rule out bacterial infection or co-infection with other viruses. If result is PRESUMPTIVE POSTIVE SARS-CoV-2 nucleic acids MAY BE PRESENT.   A presumptive positive result was obtained on the submitted specimen  and confirmed on repeat testing.  While 2019 novel coronavirus  (SARS-CoV-2) nucleic acids may be present in the submitted sample  additional confirmatory testing may be necessary for epidemiological  and / or clinical management purposes  to differentiate between  SARS-CoV-2 and other Sarbecovirus currently known to infect humans.  If clinically indicated additional testing with an alternate test  methodology [phone]) is  advised. The SARS-CoV-2 RNA is generally  detectable in upper and lower respiratory specimens during the acute  phase of infection. The expected result is Negative. Fact Sheet for Patients:  StrictlyIdeas.no Fact Sheet for Healthcare Providers: BankingDealers.co.za This test is not yet approved or cleared by the [place] FDA and has been authorized for detection and/or diagnosis of SARS-CoV-2 by FDA under an Emergency Use Authorization (EUA).  This EUA will remain in effect (meaning this test can be used) for the duration of the COVID-19 declaration under Section 564(b)(1) of the Act, 21 U.S.C. section 360bbb-3(b)(1), unless the authorization is terminated or revoked sooner. Performed at [facility], [place] [address] [place], [place] [address]   MRSA PCR Screening     Status: None   Collection Time: 08/23/18  8:10 PM   Specimen: Nasal Mucosa;  Nasopharyngeal  Result Value Ref Range Status   MRSA by PCR NEGATIVE NEGATIVE Final    Comment:        The GeneXpert MRSA Assay (FDA approved for NASAL specimens only), is one component of a comprehensive MRSA colonization surveillance program. It is not intended to diagnose MRSA infection nor to guide or monitor treatment for MRSA infections. Performed at [facility], [place] [address], [place], [place] [address]       Radiology Studies: Dg Knee 1-2 Views Left  Result Date: 08/23/2018 CLINICAL DATA:  Unwitnessed fall  EXAM: LEFT KNEE - 1-2 VIEW COMPARISON:  None. FINDINGS: No fracture or malalignment. Mild medial and lateral joint space degenerative change. Mild joint space calcifications. No large knee effusion IMPRESSION: Mild degenerative changes without acute osseous abnormality. Chondrocalcinosis. Electronically Signed   By: Jasmine PangKim  Fujinaga M.D.   On: 08/23/2018 20:13   Dg Knee 1-2 Views Right  Result Date: 08/23/2018 CLINICAL DATA:  Knee pain EXAM: RIGHT KNEE - 1-2 VIEW COMPARISON:  None FINDINGS: Mild tricompartmental degenerative changes are seen. Small joint effusion is noted. No acute bony abnormality is seen. IMPRESSION: Mild degenerative change without acute bony abnormality. Electronically Signed   By: Alcide CleverMark  Lukens M.D.   On: 08/23/2018 20:19   Ct Angio Chest Pe W And/or Wo Contrast  Result Date: 08/23/2018 CLINICAL DATA:  Covid positive, shortness of breath suspect PE EXAM: CT ANGIOGRAPHY CHEST WITH CONTRAST TECHNIQUE: Multidetector CT imaging of the chest was performed using the standard protocol during bolus administration of intravenous contrast. Multiplanar CT image reconstructions and MIPs were obtained to evaluate the vascular anatomy. CONTRAST:  75mL OMNIPAQUE IOHEXOL 350 MG/ML SOLN COMPARISON:  Radiograph August 23, 2018. FINDINGS: Cardiovascular: There is a optimal opacification of the pulmonary arteries. There is no central,segmental, or subsegmental  filling defects within the pulmonary arteries. There is mild cardiomegaly. No pericardial effusion is seen. No evidence right heart strain. There is normal three-vessel brachiocephalic anatomy without proximal stenosis. Scattered aortic atherosclerotic calcifications are seen without aneurysmal dilatation. Mediastinum/Nodes: Small calcified subcarinal and bilateral hilar lymph nodes are seen, which could be from prior granulomatous disease. Thyroid gland, trachea, and esophagus demonstrate no significant findings. Lungs/Pleura: Calcified granuloma seen at the right lung base. No focal airspace consolidation. No pleural effusion or pneumothorax. Upper Abdomen: No acute abnormalities present in the visualized portions of the upper abdomen. Atherosclerotic calcification within the aorta. Musculoskeletal: No chest wall abnormality. No acute or significant osseous findings. Degenerative changes are seen throughout the thoracic spine. There is diffuse osteopenia. Review of the MIP images confirms the above findings. IMPRESSION: 1. No central, segmental, or subsegmental pulmonary embolism. 2. No acute intrathoracic process to explain the patient's symptoms. Electronically Signed   By: Jonna ClarkBindu  Avutu M.D.   On: 08/23/2018 17:05   Dg Chest Portable 1 View  Result Date: 08/23/2018 CLINICAL DATA:  Syncope.  Positive COVID-19 EXAM: PORTABLE CHEST 1 VIEW COMPARISON:  None. FINDINGS: Heart size and vascularity normal. Calcified hilar lymph nodes bilaterally. No acute infiltrate or effusion. Lungs well aerated. IMPRESSION: No active disease. Electronically Signed   By: Marlan Palauharles  Clark M.D.   On: 08/23/2018 10:45   Dg Hip Unilat With Pelvis 2-3 Views Left  Result Date: 08/23/2018 CLINICAL DATA:  Unwitnessed fall with left hip pain EXAM: DG HIP (WITH OR WITHOUT PELVIS) 2-3V LEFT COMPARISON:  None. FINDINGS: Contrast material within the urinary bladder. Pubic symphysis and rami appear intact. No acute displaced fracture or  malalignment. IMPRESSION: No acute osseous abnormality. Electronically Signed   By: Jasmine PangKim  Fujinaga M.D.   On: 08/23/2018 20:12       LOS: 0 days   Tonianne Fine Rito EhrlichKrishnan  [facility] Pager on [email or web address]  08/24/2018, 9:45 AM

## 2018-08-24 NOTE — Progress Notes (Signed)
Updated family.

## 2018-08-24 NOTE — Progress Notes (Signed)
Updated granddaughter

## 2018-08-24 NOTE — Progress Notes (Addendum)
X rays of knees and hip negative.  Patient now with new onset A.Fib, HR 100-120, BP only 109/75 (looks like it was running as low as 98/80 earlier in the evening), and patient asymptomatic from the A.Fib standpoint so will hold off on trying to add rate control agent for the moment.  Replace K, check Mg, Check TSH, and have ordered 2D echo.  Will just leave the 40mg  daily DVT ppx lovenox alone for now, though if she remains in A.Fib later this morning, may want to switch to full anticoagulation at least during IP stay.

## 2018-08-24 NOTE — Progress Notes (Signed)
0232 Pt converted to Afib.  Doctor gardner notified. ekg ordered confirmed afib.  Labs ordered as well and N.O. for K+.  0541 Pt converted back to ST.  Doctor notified.

## 2018-08-24 NOTE — Evaluation (Signed)
Physical Therapy Evaluation Patient Details Name: Ann Cole MRN: [id number] DOB: 04/11/1940 Today's Date: 08/24/2018   History of Present Illness  78 y.o.female,with past medical history of hypertension, hyperlipidemia, dementia, from SNF, patient with recent diagnosis of COVID-19 last week,largely has been asymptomatic, patient residing at [facility].  Patient was brought in due to syncope, suspect possible due to vasovagal episode.  Clinical Impression  The patient is animated and pleasantly confused. Communicated with [place] and interpreter. Patient ambulated x 400' with RW. Initially reported right knee pain, patient reports improved with ambulation. Pt admitted with above diagnosis. Pt currently with functional limitations due to the deficits listed below (see PT Problem List). Pt will benefit from skilled PT to increase their independence and safety with mobility to allow discharge to the venue listed below.   Pt admitted with above diagnosis. Pt currently with functional limitations due to the deficits listed below (see PT Problem List)* Pt will benefit from skilled PT to increase their independence and safety with mobility to allow discharge to the venue listed below.        Follow Up Recommendations  none    Equipment Recommendations    none   Recommendations for Other Services       Precautions / Restrictions Precautions Precautions: Fall Precaution Comments: R knee pain d/t recent fall Restrictions Weight Bearing Restrictions: No      Mobility  Bed Mobility               General bed mobility comments: received OOB in recliner  Transfers Overall transfer level: Needs assistance Equipment used: Rolling walker (2 wheeled) Transfers: Sit to/from Stand Sit to Stand: Min assist         General transfer comment: assist to rise and steady at RW  Ambulation/Gait Ambulation/Gait assistance: Min guard;Min assist Gait Distance  (Feet): 400 Feet Assistive device: Rolling walker (2 wheeled) Gait Pattern/deviations: Step-through pattern;Antalgic     General Gait Details: initially antalgic on R leg, gradually improved  Stairs            Wheelchair Mobility    Modified Rankin (Stroke Patients Only)       Balance Overall balance assessment: Needs assistance Sitting-balance support: Feet supported Sitting balance-Leahy Scale: Good     Standing balance support: Bilateral upper extremity supported;During functional activity Standing balance-Leahy Scale: Poor Standing balance comment: requires external assist for standing balance                             Pertinent Vitals/Pain Pain Assessment: Faces Faces Pain Scale: Hurts little more Pain Location: R knee intermittently, states it hurts "a lot" but able to ambulate in hall without significant difficulty Pain Descriptors / Indicators: Discomfort;Guarding;Grimacing Pain Intervention(s): Monitored during session    Home Living Family/patient expects to be discharged to:: Skilled nursing facility                 Additional Comments: per chart pt residing [facility]     Prior Function Level of Independence: Needs assistance   Gait / Transfers Assistance Needed: pt reports she was not walking with AD  ADL's / Homemaking Assistance Needed: suspect some assist for ADL given hx of dementia        Hand Dominance        Extremity/Trunk Assessment   Upper Extremity Assessment Upper Extremity Assessment: Defer to OT evaluation  Lower Extremity Assessment Lower Extremity Assessment: RLE deficits/detail RLE Deficits / Details: indicates some discomfort, AROM WFL    Cervical / Trunk Assessment Cervical / Trunk Assessment: Normal  Communication   Communication: Prefers language other than English(Spanish, some English)  Cognition Arousal/Alertness: Awake/alert Behavior During Therapy:  WFL for tasks assessed/performed Overall Cognitive Status: History of cognitive impairments - at baseline                                 General Comments: pt with hx of dementia, very pleasantly confused; thinks she is in [place], states she lives at home and does all of her own cooking, cleaning, etc , communicated w/ Caren Griffins, interpreter#[phone]      General Comments      Exercises     Assessment/Plan    PT Assessment Patient needs continued PT services  PT Problem List Decreased strength;Decreased mobility;Decreased safety awareness;Decreased range of motion;Decreased knowledge of precautions;Decreased activity tolerance;Decreased cognition;Decreased balance       PT Treatment Interventions DME instruction;Gait training;Functional mobility training;Therapeutic activities;Cognitive remediation;Patient/family education    PT Goals (Current goals can be found in the Care Plan section)  Acute Rehab PT Goals Patient Stated Goal: none stated, agreeable to working with therapies PT Goal Formulation: Patient unable to participate in goal setting Time For Goal Achievement: 09/07/18 Potential to Achieve Goals: Good    Frequency Min 2X/week   Barriers to discharge        Co-evaluation               AM-PAC PT "6 Clicks" Mobility  Outcome Measure Help needed turning from your back to your side while in a flat bed without using bedrails?: A Little Help needed moving from lying on your back to sitting on the side of a flat bed without using bedrails?: A Little Help needed moving to and from a bed to a chair (including a wheelchair)?: A Little Help needed standing up from a chair using your arms (e.g., wheelchair or bedside chair)?: A Little Help needed to walk in hospital room?: A Little Help needed climbing 3-5 steps with a railing? : A Lot 6 Click Score: 17    End of Session Equipment Utilized During Treatment: Gait belt Activity Tolerance: Patient  tolerated treatment well Patient left: in chair;with call bell/phone within reach;with chair alarm set Nurse Communication: Mobility status PT Visit Diagnosis: Difficulty in walking, not elsewhere classified (R26.2)    Time: 1517-6160 PT Time Calculation (min) (ACUTE ONLY): 27 min   Charges:   PT Evaluation $PT Eval Low Complexity: [place]  Office [phone]   Claretha Cooper 08/24/2018, 5:00 PM

## 2018-08-24 NOTE — Progress Notes (Signed)
  Echocardiogram 2D Echocardiogram Limited has been performed.  Ann Cole 08/24/2018, 11:23 AM

## 2018-08-25 LAB — BASIC METABOLIC PANEL
Anion gap: 7 (ref 5–15)
BUN: 21 mg/dL (ref 8–23)
CO2: 23 mmol/L (ref 22–32)
Calcium: 8.9 mg/dL (ref 8.9–10.3)
Chloride: 107 mmol/L (ref 98–111)
Creatinine, Ser: 0.99 mg/dL (ref 0.44–1.00)
GFR calc Af Amer: >60 mL/min (ref >60)
GFR calc non Af Amer: 54 mL/min — ABNORMAL LOW (ref >60)
Glucose, Bld: 93 mg/dL (ref 70–99)
Potassium: 4.2 mmol/L (ref 3.5–5.1)
Sodium: 137 mmol/L (ref 135–145)

## 2018-08-25 LAB — CBC
HCT: 31.9 % — ABNORMAL LOW (ref 36.0–46.0)
Hemoglobin: 10.1 g/dL — ABNORMAL LOW (ref 12.0–15.0)
MCH: 29.8 pg (ref 26.0–34.0)
MCHC: 31.7 g/dL (ref 30.0–36.0)
MCV: 94.1 fL (ref 80.0–100.0)
Platelets: 294 10*3/uL (ref 150–400)
RBC: 3.39 MIL/uL — ABNORMAL LOW (ref 3.87–5.11)
RDW: 13.8 % (ref 11.5–15.5)
WBC: 10.8 10*3/uL — ABNORMAL HIGH (ref 4.0–10.5)
nRBC: 0 % (ref 0.0–0.2)

## 2018-08-25 LAB — FERRITIN: Ferritin: 144 ng/mL (ref 11–307)

## 2018-08-25 LAB — C-REACTIVE PROTEIN: CRP: 3.2 mg/dL — ABNORMAL HIGH (ref <1.0)

## 2018-08-25 LAB — ECHOCARDIOGRAM COMPLETE

## 2018-08-25 LAB — D-DIMER, QUANTITATIVE: D-Dimer, Quant: 0.89 ug/mL-FEU — ABNORMAL HIGH (ref 0.00–0.50)

## 2018-08-25 LAB — MAGNESIUM: Magnesium: 1.8 mg/dL (ref 1.7–2.4)

## 2018-08-25 MED ORDER — METOPROLOL SUCCINATE ER 100 MG PO TB24
50.0000 mg | ORAL_TABLET | Freq: Every day | ORAL | Status: DC
  Administered 2018-08-25: 50 mg via ORAL

## 2018-08-25 MED ORDER — METOPROLOL SUCCINATE ER 50 MG PO TB24: 50.0000 mg | ORAL_TABLET | Freq: Every day | ORAL | Status: DC

## 2018-08-25 MED ORDER — ASPIRIN 325 MG PO TBEC: 325.0000 mg | DELAYED_RELEASE_TABLET | Freq: Every day | ORAL | 0 refills | Status: DC

## 2018-08-25 NOTE — Progress Notes (Signed)
Report called to [place], Shelton Silvas. All questions answered.

## 2018-08-25 NOTE — Discharge Instructions (Signed)
Atrial Fibrillation ° °Atrial fibrillation is a type of heartbeat that is irregular or fast (rapid). If you have this condition, your heart beats without any order. This makes it hard for your heart to pump blood in a normal way. Having this condition gives you more risk for stroke, heart failure, and other heart problems. °Atrial fibrillation may start all of a sudden and then stop on its own, or it may become a long-lasting problem. °What are the causes? °This condition may be caused by heart conditions, such as: °· High blood pressure. °· Heart failure. °· Heart valve disease. °· Heart surgery. °Other causes include: °· Pneumonia. °· Obstructive sleep apnea. °· Lung cancer. °· Thyroid disease. °· Drinking too much alcohol. °Sometimes the cause is not known. °What increases the risk? °You are more likely to develop this condition if: °· You smoke. °· You are older. °· You have diabetes. °· You are overweight. °· You have a family history of this condition. °· You exercise often and hard. °What are the signs or symptoms? °Common symptoms of this condition include: °· A feeling like your heart is beating very fast. °· Chest pain. °· Feeling short of breath. °· Feeling light-headed or weak. °· Getting tired easily. °Follow these instructions at home: °Medicines °· Take over-the-counter and prescription medicines only as told by your doctor. °· If your doctor gives you a blood-thinning medicine, take it exactly as told. Taking too much of it can cause bleeding. Taking too little of it does not protect you against clots. Clots can cause a stroke. °Lifestyle ° °  ° °· Do not use any tobacco products. These include cigarettes, chewing tobacco, and e-cigarettes. If you need help quitting, ask your doctor. °· Do not drink alcohol. °· Do not drink beverages that have caffeine. These include coffee, soda, and tea. °· Follow diet instructions as told by your doctor. °· Exercise regularly as told by your doctor. °General  instructions °· If you have a condition that causes breathing to stop for a short period of time (apnea), treat it as told by your doctor. °· Keep a healthy weight. Do not use diet pills unless your doctor says they are safe for you. Diet pills may make heart problems worse. °· Keep all follow-up visits as told by your doctor. This is important. °Contact a doctor if: °· You notice a change in the speed, rhythm, or strength of your heartbeat. °· You are taking a blood-thinning medicine and you see more bruising. °· You get tired more easily when you move or exercise. °· You have a sudden change in weight. °Get help right away if: ° °· You have pain in your chest or your belly (abdomen). °· You have trouble breathing. °· You have blood in your vomit, poop, or pee (urine). °· You have any signs of a stroke. "BE FAST" is an easy way to remember the main warning signs: °? B - Balance. Signs are dizziness, sudden trouble walking, or loss of balance. °? E - Eyes. Signs are trouble seeing or a change in how you see. °? F - Face. Signs are sudden weakness or loss of feeling in the face, or the face or eyelid drooping on one side. °? A - Arms. Signs are weakness or loss of feeling in an arm. This happens suddenly and usually on one side of the body. °? S - Speech. Signs are sudden trouble speaking, slurred speech, or trouble understanding what people say. °? T - Time.   Time to call emergency services. Write down what time symptoms started. °· You have other signs of a stroke, such as: °? A sudden, very bad headache with no known cause. °? Feeling sick to your stomach (nausea). °? Throwing up (vomiting). °? Jerky movements you cannot control (seizure). °These symptoms may be an emergency. Do not wait to see if the symptoms will go away. Get medical help right away. Call your local emergency services (911 in the [place] Do not drive yourself to the hospital. °Summary °· Atrial fibrillation is a type of heartbeat that is irregular  or fast (rapid). °· You are at higher risk of this condition if you smoke, are older, have diabetes, or are overweight. °· Follow your doctor's instructions about medicines, diet, exercise, and follow-up visits. °· Get help right away if you think that you have signs of a stroke. °This information is not intended to replace advice given to you by your health care provider. Make sure you discuss any questions you have with your health care provider. °Document Released: 10/13/2007 Document Revised: 03/09/2017 Document Reviewed: 02/24/2017 °Elsevier Patient Education © 2020 [employer] ° °

## 2018-08-25 NOTE — TOC Transition Note (Signed)
Transition of Care [facility]) - CM/SW Discharge Note   Patient Details  Name: Ann Cole MRN: [id number] Date of Birth: [birth year]  Transition of Care [facility]) CM/SW Contact:  Ann Cole, Ann Cole Phone Number: 08/25/2018, 2:50 PM   Clinical Narrative:     Patient will DC to: [facility] Anticipated DC date: 08/25/2018 Family notified:Ann Cole Transport VC:[facility]  Per MD patient ready for DC to [facility] . RN, patient, patient's family, and facility notified of DC. Discharge Summary sent to facility. COVID most recent test sent to facility. Facility reports they do not need FL2 as they already have, and do not need a new one.  RN given number for report [phone] ask for [place]   . DC packet on chart. Ambulance transport requested for patient.  CSW signing off.  Ann Cole, Ann Cole   Final next level of care: Assisted Living Barriers to Discharge: No Barriers Identified   Patient Goals and CMS Choice Patient states their goals for this hospitalization and ongoing recovery are:: return to [facility].gov Compare Post Acute Care list provided to:: Patient Represenative (must comment)(Ann Cole (granddaughter)) Choice offered to / list presented to : Adult Children(Ann Cole, Granddaughter)  Discharge Placement              Patient [facility] bed at: [facility] Patient to be transferred to facility by: [place] Name of family member notified: Ann Cole Patient and family notified of of transfer: 08/25/18  Discharge Plan and Services                                     Social Determinants of Health (SDOH) Interventions     Readmission Risk Interventions No flowsheet data found.

## 2018-08-25 NOTE — Discharge Summary (Signed)
[facility]  Physician Discharge Summary   Patient ID: Ann Cole MRN: [id number] DOB/AGE: [birth year] 78 y.o.  Admit date: 08/23/2018 Discharge date: 08/25/2018  PCP: Deeann Saint, MD  DISCHARGE DIAGNOSES:  Syncope most likely vasovagal Paroxysmal atrial fibrillation, newly diagnosed COVID-19 infection History of dementia Hypertension, essential Hypothyroidism    RECOMMENDATIONS FOR OUTPATIENT FOLLOW UP: 1. Dose of metoprolol was increased   Home Health: None Equipment/Devices: None  CODE STATUS: Full code  DISCHARGE CONDITION: fair  Diet recommendation: Regular diet as tolerated  INITIAL HISTORY: 78 y.o.female,with past medical history of hypertension, hyperlipidemia, dementia, from SNF, patient with recent diagnosis of COVID-19 last week,largely has been asymptomatic, patient residing at [facility] care unit.  Patient was brought in due to syncope.  She apparently lost consciousness after she had episode of diarrhea on the morning of admission.  She was hospitalized for further management.  Over the course of the night last night she had atrial fibrillation with RVR.  Procedures: Transthoracic echocardiogram Due to technical issues the report is not coming through to epic.  Discussed with cardiology.  Apparently showed normal systolic function.  Some diastolic dysfunction was noted.  Right ventricular function was also normal.   HOSPITAL COURSE:   Syncope This apparently occurred after she had a large bowel movement.  Most likely a vasovagal episode.  Her EKG did not show any concerning findings.  Troponins were negative.  CT angiogram was negative for PE.  Patient was admitted to the hospital.  Overnight she did have an episode of atrial fibrillation with RVR.  Unclear if this might have contributed to her syncope.  Backslash normal.  Echocardiogram showed normal systolic function.  Atrial fibrillation with RVR, paroxysmal  Patient had episode of atrial fibrillation with RVR.  Spontaneously converted to sinus rhythm.    Patient was already on beta-blocker.  The metoprolol dose was increased.  Heart rate has been well controlled.  No known previous history of atrial fibrillation.  Patient's chads 2 vascular score is 4.  However patient with history of dementia and lives in a memory care unit. was brief.    Echocardiogram as above.  Electrolytes were replaced.  Discussed with her granddaughter who is the primary next of kin.  Discussed pros and cons and risks and benefits of anticoagulation.  She chooses not to anticoagulate her grandmother at this time.  This is quite reasonable in this patient with dementia.  We will place her on aspirin.  COVID-19 infection She apparently tested positive last week at the skilled nursing facility.  Has not had any symptoms.  She appears to be asymptomatic here as well.  She is afebrile.  She is saturating normal on room air.  No need for COVID-19 specific treatments at this time.  Continue to monitor.  Her CRP was mildly elevated at 3.5.  D-dimer 1.27.  As discussed above CT angiogram was negative for PE.  History of hypothyroidism Continue with the levothyroxine.  TSH was normal.  Essential hypertension Blood pressure was noted to be elevated in the ED.   she was started back on her metoprolol.  Blood pressure is reasonably well controlled.  History of dementia Now she is pleasantly confused.  Disoriented.  Continue with home medications.  Is on Aricept and Namenda.  Also noted to be on Zoloft.  Right knee pain Right knee noted to be mildly swollen compared to the left.  No effusion noted.  Restricted range of motion.  Imaging studies do not show  any fracture.  Probably related to her fall.  Seen by PT and OT.  Overall stable.  Okay for discharge back to her skilled nursing facility today.   PERTINENT LABS:  The results of significant diagnostics from this hospitalization  (including imaging, microbiology, ancillary and laboratory) are listed below for reference.    Microbiology: Recent Results (from the past 240 hour(s))  Urine culture     Status: None   Collection Time: 08/23/18 10:02 AM   Specimen: Urine, Random  Result Value Ref Range Status   Specimen Description URINE, RANDOM  Final   Special Requests NONE  Final   Culture   Final    NO GROWTH Performed at [facility], [address] [place], [place] [address]    Report Status 08/24/2018 FINAL  Final  SARS Coronavirus 2 [facility](Hospital order, Performed in [facility] hospital lab) Nasopharyngeal Nasopharyngeal Swab     Status: Abnormal   Collection Time: 08/23/18  1:20 PM   Specimen: Nasopharyngeal Swab  Result Value Ref Range Status   SARS Coronavirus 2 POSITIVE (A) NEGATIVE Final    Comment: RESULT CALLED TO, READ BACK BY AND VERIFIED WITH: Candice CampH. Dooley RN 15:10 08/23/18 ([email or web address]) (NOTE) If result is NEGATIVE SARS-CoV-2 target nucleic acids are NOT DETECTED. The SARS-CoV-2 RNA is generally detectable in upper and lower  respiratory specimens during the acute phase of infection. The lowest  concentration of SARS-CoV-2 viral copies this assay can detect is 250  copies / mL. A negative result does not preclude SARS-CoV-2 infection  and should not be used as the sole basis for treatment or other  patient management decisions.  A negative result may occur with  improper specimen collection / handling, submission of specimen other  than nasopharyngeal swab, presence of viral mutation(s) within the  areas targeted by this assay, and inadequate number of viral copies  (<250 copies / mL). A negative result must be combined with clinical  observations, patient history, and epidemiological information. If result is POSITIVE SARS-CoV-2 target nucleic acids are DETECTED. T he SARS-CoV-2 RNA is generally detectable in upper and lower  respiratory specimens during the acute phase of infection.  Positive   results are indicative of active infection with SARS-CoV-2.  Clinical  correlation with patient history and other diagnostic information is  necessary to determine patient infection status.  Positive results do  not rule out bacterial infection or co-infection with other viruses. If result is PRESUMPTIVE POSTIVE SARS-CoV-2 nucleic acids MAY BE PRESENT.   A presumptive positive result was obtained on the submitted specimen  and confirmed on repeat testing.  While 2019 novel coronavirus  (SARS-CoV-2) nucleic acids may be present in the submitted sample  additional confirmatory testing may be necessary for epidemiological  and / or clinical management purposes  to differentiate between  SARS-CoV-2 and other Sarbecovirus currently known to infect humans.  If clinically indicated additional testing with an alternate test  methodology [phone]([id number]) is  advised. The SARS-CoV-2 RNA is generally  detectable in upper and lower respiratory specimens during the acute  phase of infection. The expected result is Negative. Fact Sheet for Patients:  BoilerBrush.com.cyhttps://www.fda.gov/media/136312/download Fact Sheet for Healthcare Providers: https://pope.com/https://www.fda.gov/media/136313/download This test is not yet approved or cleared by the [place] FDA and has been authorized for detection and/or diagnosis of SARS-CoV-2 by FDA under an Emergency Use Authorization (EUA).  This EUA will remain in effect (meaning this test can be used) for the duration of the COVID-19 declaration under Section 564(b)(1) of  the Act, 21 U.S.C. section 360bbb-3(b)(1), unless the authorization is terminated or revoked sooner. Performed at [facility], [place] [address] [place], [place] [address]   MRSA PCR Screening     Status: None   Collection Time: 08/23/18  8:10 PM   Specimen: Nasal Mucosa; Nasopharyngeal  Result Value Ref Range Status   MRSA by PCR NEGATIVE NEGATIVE Final    Comment:        The GeneXpert MRSA Assay (FDA approved  for NASAL specimens only), is one component of a comprehensive MRSA colonization surveillance program. It is not intended to diagnose MRSA infection nor to guide or monitor treatment for MRSA infections. Performed at [facility], [place] [address], [place], [place] [address]      Labs: Basic Metabolic Panel: Recent Labs  Lab 08/23/18 0951 08/24/18 0117 08/25/18 0129  NA 136 139 137  K 3.7 3.4* 4.2  CL 102 106 107  CO2 25 25 23   GLUCOSE 115* 94 93  BUN 10 13 21   CREATININE 1.02* 0.85 0.99  CALCIUM 9.2 9.0 8.9  MG  --  1.9 1.8   Liver Function Tests: Recent Labs  Lab 08/23/18 0951  AST 16  ALT 11  ALKPHOS 94  BILITOT 0.6  PROT 7.4  ALBUMIN 3.4*   CBC: Recent Labs  Lab 08/23/18 0951 08/24/18 0117 08/25/18 0129  WBC 15.3* 10.4 10.8*  NEUTROABS 12.5*  --   --   HGB 12.6 10.5* 10.1*  HCT 39.8 33.5* 31.9*  MCV 94.3 92.8 94.1  PLT 309 305 294     IMAGING STUDIES Dg Knee 1-2 Views Left  Result Date: 08/23/2018 CLINICAL DATA:  Unwitnessed fall EXAM: LEFT KNEE - 1-2 VIEW COMPARISON:  None. FINDINGS: No fracture or malalignment. Mild medial and lateral joint space degenerative change. Mild joint space calcifications. No large knee effusion IMPRESSION: Mild degenerative changes without acute osseous abnormality. Chondrocalcinosis. Electronically Signed   By: Donavan Foil M.D.   On: 08/23/2018 20:13   Dg Knee 1-2 Views Right  Result Date: 08/23/2018 CLINICAL DATA:  Knee pain EXAM: RIGHT KNEE - 1-2 VIEW COMPARISON:  None FINDINGS: Mild tricompartmental degenerative changes are seen. Small joint effusion is noted. No acute bony abnormality is seen. IMPRESSION: Mild degenerative change without acute bony abnormality. Electronically Signed   By: Inez Catalina M.D.   On: 08/23/2018 20:19   Ct Angio Chest Pe W And/or Wo Contrast  Result Date: 08/23/2018 CLINICAL DATA:  Covid positive, shortness of breath suspect PE EXAM: CT ANGIOGRAPHY CHEST WITH CONTRAST  TECHNIQUE: Multidetector CT imaging of the chest was performed using the standard protocol during bolus administration of intravenous contrast. Multiplanar CT image reconstructions and MIPs were obtained to evaluate the vascular anatomy. CONTRAST:  70mL OMNIPAQUE IOHEXOL 350 MG/ML SOLN COMPARISON:  Radiograph August 23, 2018. FINDINGS: Cardiovascular: There is a optimal opacification of the pulmonary arteries. There is no central,segmental, or subsegmental filling defects within the pulmonary arteries. There is mild cardiomegaly. No pericardial effusion is seen. No evidence right heart strain. There is normal three-vessel brachiocephalic anatomy without proximal stenosis. Scattered aortic atherosclerotic calcifications are seen without aneurysmal dilatation. Mediastinum/Nodes: Small calcified subcarinal and bilateral hilar lymph nodes are seen, which could be from prior granulomatous disease. Thyroid gland, trachea, and esophagus demonstrate no significant findings. Lungs/Pleura: Calcified granuloma seen at the right lung base. No focal airspace consolidation. No pleural effusion or pneumothorax. Upper Abdomen: No acute abnormalities present in the visualized portions of the upper abdomen. Atherosclerotic  calcification within the aorta. Musculoskeletal: No chest wall abnormality. No acute or significant osseous findings. Degenerative changes are seen throughout the thoracic spine. There is diffuse osteopenia. Review of the MIP images confirms the above findings. IMPRESSION: 1. No central, segmental, or subsegmental pulmonary embolism. 2. No acute intrathoracic process to explain the patient's symptoms. Electronically Signed   By: Jonna Clark M.D.   On: 08/23/2018 17:05   Dg Chest Portable 1 View  Result Date: 08/23/2018 CLINICAL DATA:  Syncope.  Positive COVID-19 EXAM: PORTABLE CHEST 1 VIEW COMPARISON:  None. FINDINGS: Heart size and vascularity normal. Calcified hilar lymph nodes bilaterally. No acute infiltrate  or effusion. Lungs well aerated. IMPRESSION: No active disease. Electronically Signed   By: Marlan Palau M.D.   On: 08/23/2018 10:45   Dg Knee Complete 4 Views Right  Result Date: 08/01/2018 CLINICAL DATA:  Chronic right knee pain.  No known injury. EXAM: RIGHT KNEE - COMPLETE 4+ VIEW COMPARISON:  None. FINDINGS: There is no acute bony or joint abnormality. Joint spaces are preserved. There is some osteophytosis about the knee. Chondrocalcinosis is noted. No joint effusion. IMPRESSION: No acute abnormality. Mild osteoarthritis. Chondrocalcinosis. Electronically Signed   By: Drusilla Kanner M.D.   On: 08/01/2018 14:28   Dg Abd Acute 2+v W 1v Chest  Result Date: 08/01/2018 CLINICAL DATA:  Abdominal pain and decreased appetite for several days. EXAM: DG ABDOMEN ACUTE W/ 1V CHEST COMPARISON:  None. FINDINGS: Single-view of the chest demonstrates clear lungs. Heart size is normal. Aortic atherosclerosis is noted. Calcified hilar lymph nodes are seen. Two views of the abdomen show no free intraperitoneal air. The bowel gas pattern is normal. No acute or focal bony abnormality. IMPRESSION: No acute finding chest or abdomen. Atherosclerosis. Electronically Signed   By: Drusilla Kanner M.D.   On: 08/01/2018 14:27   Dg Hip Unilat With Pelvis 2-3 Views Left  Result Date: 08/23/2018 CLINICAL DATA:  Unwitnessed fall with left hip pain EXAM: DG HIP (WITH OR WITHOUT PELVIS) 2-3V LEFT COMPARISON:  None. FINDINGS: Contrast material within the urinary bladder. Pubic symphysis and rami appear intact. No acute displaced fracture or malalignment. IMPRESSION: No acute osseous abnormality. Electronically Signed   By: Jasmine Pang M.D.   On: 08/23/2018 20:12    DISCHARGE EXAMINATION: Vitals:   08/24/18 1557 08/24/18 2000 08/25/18 0527 08/25/18 0800  BP:  132/61 (!) 139/58 (!) 149/72  Pulse: 100 85    Resp:  (!) 23 15 20   Temp: 98.6 F (37 C) 98.5 F (36.9 C) 98.1 F (36.7 C) 98.2 F (36.8 C)  TempSrc: Oral  Oral Oral   SpO2: 98% 98%     General appearance: Awake alert.  In no distress.  Pleasantly confused Resp: Clear to auscultation bilaterally.  Normal effort Cardio: S1-S2 is normal regular.  No S3-S4.  No rubs murmurs or bruit GI: Abdomen is soft.  Nontender nondistended.  Bowel sounds are present normal.  No masses organomegaly Extremities: No edema.  Mildly swollen right knee with restricted range of motion. Neurologic: Disoriented.  No focal neurological deficits.    DISPOSITION: Skilled nursing facility  Discharge Instructions    Call MD for:  difficulty breathing, headache or visual disturbances   Complete by: As directed    Call MD for:  extreme fatigue   Complete by: As directed    Call MD for:  persistant dizziness or light-headedness   Complete by: As directed    Call MD for:  persistant nausea and vomiting  Complete by: As directed    Call MD for:  severe uncontrolled pain   Complete by: As directed    Call MD for:  temperature >100.4   Complete by: As directed    Discharge instructions   Complete by: As directed    COVID 19 INSTRUCTIONS  - You are felt to be stable enough to no longer require inpatient monitoring, testing, and treatment, though you will need to follow the recommendations below: - Based on the CDC's non-test criteria for ending self-isolation: You may not return to work/leave the home until at least 21 days since symptom onset AND 3 days without a fever (without taking tylenol, ibuprofen, etc.) AND have improvement in respiratory symptoms. - Do not take NSAID medications (including, but not limited to, ibuprofen, advil, motrin, naproxen, aleve, goody's powder, etc.) - Follow up with your doctor in the next week via telehealth or seek medical attention right away if your symptoms get WORSE.  - Consider donating plasma after you have recovered (either 14 days after a negative test or 28 days after symptoms have completely resolved) because your antibodies  to this virus may be helpful to give to others with life-threatening infections. Please go to the website www.[email or web address] if you would like to consider volunteering for plasma donation.    Directions for you at home:  Wear a facemask You should wear a facemask that covers your nose and mouth when you are in the same room with other people and when you visit a healthcare provider. People who live with or visit you should also wear a facemask while they are in the same room with you.  Separate yourself from other people in your home As much as possible, you should stay in a different room from other people in your home. Also, you should use a separate bathroom, if available.  Avoid sharing household items You should not share dishes, drinking glasses, cups, eating utensils, towels, bedding, or other items with other people in your home. After using these items, you should wash them thoroughly with soap and water.  Cover your coughs and sneezes Cover your mouth and nose with a tissue when you cough or sneeze, or you can cough or sneeze into your sleeve. Throw used tissues in a lined trash can, and immediately wash your hands with soap and water for at least 20 seconds or use an alcohol-based hand rub.  Wash your [employer] your hands often and thoroughly with soap and water for at least 20 seconds. You can use an alcohol-based hand sanitizer if soap and water are not available and if your hands are not visibly dirty. Avoid touching your eyes, nose, and mouth with unwashed hands.  Directions for those who live with, or provide care at home for you:  Limit the number of people who have contact with the patient If possible, have only one caregiver for the patient. Other household members should stay in another home or place of residence. If this is not possible, they should stay in another room, or be separated from the patient as much as possible. Use a separate bathroom, if available.  Restrict visitors who do not have an essential need to be in the home.  Ensure good ventilation Make sure that shared spaces in the home have good air flow, such as from an air conditioner or an opened window, weather permitting.  Wash your hands often Wash your hands often and thoroughly with soap and water for at least 20 seconds.  You can use an alcohol based hand sanitizer if soap and water are not available and if your hands are not visibly dirty. Avoid touching your eyes, nose, and mouth with unwashed hands. Use disposable paper towels to dry your hands. If not available, use dedicated cloth towels and replace them when they become wet.  Wear a facemask and gloves Wear a disposable facemask at all times in the room and gloves when you touch or have contact with the patient's blood, body fluids, and/or secretions or excretions, such as sweat, saliva, sputum, nasal mucus, vomit, urine, or feces.  Ensure the mask fits over your nose and mouth tightly, and do not touch it during use. Throw out disposable facemasks and gloves after using them. Do not reuse. Wash your hands immediately after removing your facemask and gloves. If your personal clothing becomes contaminated, carefully remove clothing and launder. Wash your hands after handling contaminated clothing. Place all used disposable facemasks, gloves, and other waste in a lined container before disposing them with other household waste. Remove gloves and wash your hands immediately after handling these items.  Do not share dishes, glasses, or other household items with the patient Avoid sharing household items. You should not share dishes, drinking glasses, cups, eating utensils, towels, bedding, or other items with a patient who is confirmed to have, or being evaluated for, COVID-19 infection. After the person uses these items, you should wash them thoroughly with soap and water.  Wash laundry thoroughly Immediately remove and wash  clothes or bedding that have blood, body fluids, and/or secretions or excretions, such as sweat, saliva, sputum, nasal mucus, vomit, urine, or feces, on them. Wear gloves when handling laundry from the patient. Read and follow directions on labels of laundry or clothing items and detergent. In general, wash and dry with the warmest temperatures recommended on the label.  Clean all areas the individual has used often Clean all touchable surfaces, such as counters, tabletops, doorknobs, bathroom fixtures, toilets, phones, keyboards, tablets, and bedside tables, every day. Also, clean any surfaces that may have blood, body fluids, and/or secretions or excretions on them. Wear gloves when cleaning surfaces the patient has come in contact with. Use a diluted bleach solution (e.g., dilute bleach with 1 part bleach and 10 parts water) or a household disinfectant with a label that says EPA-registered for coronaviruses. To make a bleach solution at home, add 1 tablespoon of bleach to 1 quart (4 cups) of water. For a larger supply, add  cup of bleach to 1 gallon (16 cups) of water. Read labels of cleaning products and follow recommendations provided on product labels. Labels contain instructions for safe and effective use of the cleaning product including precautions you should take when applying the product, such as wearing gloves or eye protection and making sure you have good ventilation during use of the product. Remove gloves and wash hands immediately after cleaning.  Monitor yourself for signs and symptoms of illness Caregivers and household members are considered close contacts, should monitor their health, and will be asked to limit movement outside of the home to the extent possible. Follow the monitoring steps for close contacts listed on the symptom monitoring form.   If you have additional questions, contact your local health department or call the epidemiologist on call at [phone]  (available 24/7). This guidance is subject to change. For the most up-to-date guidance from [facility], please refer to their website: TripMetro.huhttps://www.cdc.gov/coronavirus/2019-ncov/hcp/guidance-prevent-spread.html   You were cared for by a hospitalist during your  hospital stay. If you have any questions about your discharge medications or the care you received while you were in the hospital after you are discharged, you can call the unit and asked to speak with the hospitalist on call if the hospitalist that took care of you is not available. Once you are discharged, your primary care physician will handle any further medical issues. Please note that NO REFILLS for any discharge medications will be authorized once you are discharged, as it is imperative that you return to your primary care physician (or establish a relationship with a primary care physician if you do not have one) for your aftercare needs so that they can reassess your need for medications and monitor your lab values. If you do not have a primary care physician, you can call [phone] for a physician referral.   Increase activity slowly   Complete by: As directed         Allergies as of 08/25/2018   No Known Allergies     Medication List    STOP taking these medications   cephALEXin 500 MG capsule Commonly known as: KEFLEX     TAKE these medications   acetaminophen 500 MG tablet Commonly known as: TYLENOL Take 500 mg by mouth every 6 (six) hours as needed for mild pain, fever or headache.   aspirin 325 MG EC tablet Take 1 tablet (325 mg total) by mouth daily.   donepezil 10 MG tablet Commonly known as: ARICEPT Take 1 tablet (10 mg total) by mouth at bedtime.   guaifenesin 100 MG/5ML syrup Commonly known as: ROBITUSSIN Take 200 mg by mouth every 6 (six) hours as needed for cough.   levothyroxine 88 MCG tablet Commonly known as: SYNTHROID Take 1 tablet (88 mcg total) by mouth daily before breakfast.   loperamide 2 MG  capsule Commonly known as: IMODIUM Take 2 mg by mouth as needed for diarrhea or loose stools (DO NOT EXCEED 8 DOSES IN 24HRS).   magnesium hydroxide 400 MG/5ML suspension Commonly known as: MILK OF MAGNESIA Take 5 mLs by mouth daily as needed for mild constipation.   Melatonin 5 MG Tabs Take 5 mg by mouth at bedtime.   memantine 5 MG tablet Commonly known as: NAMENDA Take 1 tablet (5 mg total) by mouth 2 (two) times daily.   metoprolol succinate 50 MG 24 hr tablet Commonly known as: TOPROL-XL Take 1 tablet (50 mg total) by mouth daily. Take with or immediately following a meal. What changed:   medication strength  how much to take  additional instructions   Mintox 200-200-20 MG/5ML suspension Generic drug: alum & mag hydroxide-simeth Take 30 mLs by mouth every 6 (six) hours as needed for indigestion or heartburn (NOT TO EXCEED 4 DOSES IN 24HRS).   neomycin-bacitracin-polymyxin [phone] ointment Apply 1 application topically 4 (four) times daily.   NUTRITIONAL SHAKE PO Take 1 Can by mouth 3 (three) times daily.   sertraline 50 MG tablet Commonly known as: ZOLOFT Take 1 tablet (50 mg total) by mouth daily.        Follow-up Information    Deeann SaintBanks, Shannon R, MD Follow up.   Specialty: Family Medicine Contact information: [address] [place] [id number] [phone]           TOTAL DISCHARGE TIME: 35 minutes  Kanishk Stroebel Rito EhrlichKrishnan  [facility] Pager on [email or web address]  08/25/2018, 2:11 PM

## 2019-07-31 ENCOUNTER — Other Ambulatory Visit: Payer: Self-pay

## 2019-07-31 ENCOUNTER — Emergency Department (HOSPITAL_COMMUNITY)
Admission: EM | Admit: 2019-07-31 | Discharge: 2019-07-31 | Disposition: A | Discharge status: Home / Self Care | Payer: Medicare Other | Attending: Emergency Medicine | Admitting: Emergency Medicine

## 2019-07-31 DIAGNOSIS — Z7982 Long term (current) use of aspirin: Secondary | ICD-10-CM | POA: Diagnosis not present

## 2019-07-31 DIAGNOSIS — N3001 Acute cystitis with hematuria: Secondary | ICD-10-CM | POA: Diagnosis present

## 2019-07-31 DIAGNOSIS — Z79899 Other long term (current) drug therapy: Secondary | ICD-10-CM | POA: Insufficient documentation

## 2019-07-31 DIAGNOSIS — I10 Essential (primary) hypertension: Secondary | ICD-10-CM | POA: Diagnosis not present

## 2019-07-31 LAB — URINALYSIS, ROUTINE W REFLEX MICROSCOPIC
Bilirubin Urine: NEGATIVE
Glucose, UA: NEGATIVE mg/dL
Ketones, ur: NEGATIVE mg/dL
Nitrite: NEGATIVE
Protein, ur: NEGATIVE mg/dL
RBC / HPF: >50 RBC/hpf — ABNORMAL HIGH (ref 0–5)
Specific Gravity, Urine: 1.01 (ref 1.005–1.030)
WBC, UA: >50 WBC/hpf — ABNORMAL HIGH (ref 0–5)
pH: 7 (ref 5.0–8.0)

## 2019-07-31 MED ORDER — CEPHALEXIN 500 MG PO CAPS: 500.0000 mg | ORAL_CAPSULE | Freq: Three times a day (TID) | ORAL | 0 refills | Status: DC

## 2019-07-31 MED ORDER — CEFTRIAXONE SODIUM 1 G IJ SOLR
1.0000 g | Freq: Once | INTRAMUSCULAR | Status: AC
  Administered 2019-07-31: 1 g via INTRAMUSCULAR
  Filled 2019-07-31: qty 10

## 2019-07-31 MED ORDER — STERILE WATER FOR INJECTION IJ SOLN
INTRAMUSCULAR | Status: AC
  Filled 2019-07-31: qty 10

## 2019-07-31 NOTE — ED Provider Notes (Signed)
[facility]-EMERGENCY DEPT Provider Note   CSN: [id number] Arrival date & time: 07/31/19  0049     History Chief Complaint  Patient presents with  . Hematuria    Ann Cole is a 79 y.o. female.  HPI     History taken with Spanish interpreter.  This is an 79 year old female with a history of hypertension, hyperlipidemia, thyroid disease who presents from her living facility with hematuria.  On my evaluation, patient has no complaints.  She denies any bloody urine, dysuria, chest pain, shortness of breath, abdominal pain, nausea, vomiting.  She states that she does not know why she is here.  She is oriented to herself and place but not time.  Per EMS report, staff reported hematuria earlier today.  No noted fevers.  Per EMS GCS of 14 at baseline.  Level 5 caveat for disorientation.  Past Medical History:  Diagnosis Date  . Hyperlipidemia   . Hypertension   . Thyroid disease     Patient Active Problem List   Diagnosis Date Noted  . Syncope 08/23/2018  . COVID-19 virus infection 08/23/2018  . Dementia (HCC) 01/05/2018  . Essential hypertension 01/05/2018    No past surgical history on file.   OB History   No obstetric history on file.     Family History  Problem Relation Age of Onset  . Heart disease Mother   . Mental illness Mother   . Stroke Father   . Mental illness Brother   . Heart disease Daughter   . Alcohol abuse Son     Social History   Tobacco Use  . Smoking status: Never Smoker  . Smokeless tobacco: Never Used  Substance Use Topics  . Alcohol use: Never  . Drug use: Never    Home Medications Prior to Admission medications   Medication Sig Start Date End Date Taking? Authorizing Provider  acetaminophen (TYLENOL) 500 MG tablet Take 500 mg by mouth every 6 (six) hours as needed for mild pain, fever or headache.    [provider]  alum & mag hydroxide-simeth (MINTOX) 200-200-20 MG/5ML suspension Take  30 mLs by mouth every 6 (six) hours as needed for indigestion or heartburn (NOT TO EXCEED 4 DOSES IN 24HRS).    [provider]  aspirin EC 325 MG EC tablet Take 1 tablet (325 mg total) by mouth daily. 08/25/18   Osvaldo Shipper, MD  cephALEXin (KEFLEX) 500 MG capsule Take 1 capsule (500 mg total) by mouth 3 (three) times daily. 07/31/19   Jye Fariss, Mayer Masker, MD  donepezil (ARICEPT) 10 MG tablet Take 1 tablet (10 mg total) by mouth at bedtime. 11/29/17   Deeann Saint, MD  guaifenesin (ROBITUSSIN) 100 MG/5ML syrup Take 200 mg by mouth every 6 (six) hours as needed for cough.    [provider]  levothyroxine (SYNTHROID, LEVOTHROID) 88 MCG tablet Take 1 tablet (88 mcg total) by mouth daily before breakfast. 11/29/17   Deeann Saint, MD  loperamide (IMODIUM) 2 MG capsule Take 2 mg by mouth as needed for diarrhea or loose stools (DO NOT EXCEED 8 DOSES IN 24HRS).    [provider]  magnesium hydroxide (MILK OF MAGNESIA) 400 MG/5ML suspension Take 5 mLs by mouth daily as needed for mild constipation.    [provider]  Melatonin 5 MG TABS Take 5 mg by mouth at bedtime.    [provider]  memantine (NAMENDA) 5 MG tablet Take 1 tablet (5 mg total) by mouth  2 (two) times daily. 11/29/17   Deeann Saint, MD  metoprolol succinate (TOPROL-XL) 50 MG 24 hr tablet Take 1 tablet (50 mg total) by mouth daily. Take with or immediately following a meal. 08/25/18   Osvaldo Shipper, MD  neomycin-bacitracin-polymyxin (NEOSPORIN) [phone] ointment Apply 1 application topically 4 (four) times daily.    [provider]  Nutritional Supplements (NUTRITIONAL SHAKE PO) Take 1 Can by mouth 3 (three) times daily.    [provider]  sertraline (ZOLOFT) 50 MG tablet Take 1 tablet (50 mg total) by mouth daily. 11/29/17   Deeann Saint, MD    Allergies    Patient has no known allergies.  Review of Systems   Review of Systems  Unable to perform ROS:  Dementia  Genitourinary: Positive for hematuria.    Physical Exam Updated Vital Signs BP (!) 146/57 (BP Location: Right Arm)   Pulse 97   Temp 98 F (36.7 C) (Oral)   Resp 18   Ht 1.524 m (5')   Wt 82.6 kg   SpO2 97%   BMI 35.54 kg/m   Physical Exam Vitals and nursing note reviewed.  Constitutional:      Appearance: She is well-developed. She is obese. She is not ill-appearing.  HENT:     Head: Normocephalic and atraumatic.     Nose: Nose normal.     Mouth/Throat:     Mouth: Mucous membranes are moist.  Eyes:     Pupils: Pupils are equal, round, and reactive to light.  Cardiovascular:     Rate and Rhythm: Normal rate and regular rhythm.     Heart sounds: Normal heart sounds.  Pulmonary:     Effort: Pulmonary effort is normal. No respiratory distress.     Breath sounds: No wheezing.  Abdominal:     General: Bowel sounds are normal.     Palpations: Abdomen is soft.     Tenderness: There is no abdominal tenderness. There is no guarding or rebound.  Musculoskeletal:     Cervical back: Neck supple.     Right lower leg: No edema.     Left lower leg: No edema.  Skin:    General: Skin is warm and dry.  Neurological:     Mental Status: She is alert and oriented to person, place, and time.  Psychiatric:        Mood and Affect: Mood normal.     ED Results / Procedures / Treatments   Labs (all labs ordered are listed, but only abnormal results are displayed) Labs Reviewed  URINALYSIS, ROUTINE W REFLEX MICROSCOPIC - Abnormal; Notable for the following components:      Result Value   APPearance HAZY (*)    Hgb urine dipstick LARGE (*)    Leukocytes,Ua LARGE (*)    RBC / HPF >50 (*)    WBC, UA >50 (*)    Bacteria, UA MANY (*)    All other components within normal limits  URINE CULTURE    EKG None  Radiology No results found.  Procedures Procedures (including critical care time)  Medications Ordered in ED Medications  cefTRIAXone (ROCEPHIN) injection 1 g  (has no administration in time range)    ED Course  I have reviewed the triage vital signs and the nursing notes.  Pertinent labs & imaging results that were available during my care of the patient were reviewed by me and considered in my medical decision making (see chart for details).    MDM Rules/Calculators/A&P  Patient presents from her living facility with reported hematuria.  Has no complaints for me and is otherwise nontoxic appearing.  Vital signs are reassuring.  She is afebrile.  Physical exam is benign.  Given the reported history, we will start off by obtaining a urine.  Urinalysis with large leukocyte esterase, greater than 50 red cells, greater than 50 white cells, and many bacteria.  It appears to be a clean specimen.  Urine culture was sent.  Patient was given IM Rocephin.  She does not appear systemically ill and has no CVA tenderness or fever.  Suspect cystitis.  Will treat with Keflex 3 times daily.  No further work-up at this time as have low suspicion for pyelonephritis or kidney stone.  After history, exam, and medical workup I feel the patient has been appropriately medically screened and is safe for discharge home. Pertinent diagnoses were discussed with the patient. Patient was given return precautions.   Final Clinical Impression(s) / ED Diagnoses Final diagnoses:  Acute cystitis with hematuria    Rx / DC Orders ED Discharge Orders         Ordered    cephALEXin (KEFLEX) 500 MG capsule  3 times daily     Discontinue  Reprint     07/31/19 0326           Shon Baton, MD 07/31/19 [phone]

## 2019-07-31 NOTE — ED Notes (Signed)
[facility] called for transport.  

## 2019-07-31 NOTE — ED Notes (Signed)
Report called to Ide[facility] at [facility]

## 2019-07-31 NOTE — ED Triage Notes (Addendum)
Pt coming from [facility], staff reports hematuria starting yesterday, pt c/o burning with urination, denies flank pain or fevers, GCS 14 which is baseline for patient, able to ambulate from EMS stretcher to bed

## 2019-08-02 LAB — URINE CULTURE: Culture: >=100000 — AB

## 2020-05-18 ENCOUNTER — Encounter: Payer: Self-pay | Admitting: Family Medicine

## 2020-05-18 ENCOUNTER — Ambulatory Visit (INDEPENDENT_AMBULATORY_CARE_PROVIDER_SITE_OTHER): Payer: Medicare Other | Admitting: Family Medicine

## 2020-05-18 VITALS — BP 110/76 | Temp 97.8°F | Wt 209.0 lb

## 2020-05-18 DIAGNOSIS — R0689 Other abnormalities of breathing: Secondary | ICD-10-CM

## 2020-05-18 DIAGNOSIS — G309 Alzheimer's disease, unspecified: Secondary | ICD-10-CM | POA: Diagnosis not present

## 2020-05-18 DIAGNOSIS — F0280 Dementia in other diseases classified elsewhere without behavioral disturbance: Secondary | ICD-10-CM | POA: Diagnosis not present

## 2020-05-18 DIAGNOSIS — E782 Mixed hyperlipidemia: Secondary | ICD-10-CM | POA: Diagnosis not present

## 2020-05-18 DIAGNOSIS — E039 Hypothyroidism, unspecified: Secondary | ICD-10-CM

## 2020-05-18 DIAGNOSIS — I10 Essential (primary) hypertension: Secondary | ICD-10-CM

## 2020-05-18 DIAGNOSIS — R635 Abnormal weight gain: Secondary | ICD-10-CM | POA: Diagnosis not present

## 2020-05-18 DIAGNOSIS — F339 Major depressive disorder, recurrent, unspecified: Secondary | ICD-10-CM

## 2020-05-18 LAB — COMPREHENSIVE METABOLIC PANEL
ALT: 14 U/L (ref 0–35)
AST: 16 U/L (ref 0–37)
Albumin: 3.9 g/dL (ref 3.5–5.2)
Alkaline Phosphatase: 150 U/L — ABNORMAL HIGH (ref 39–117)
BUN: 22 mg/dL (ref 6–23)
CO2: 24 mEq/L (ref 19–32)
Calcium: 9.4 mg/dL (ref 8.4–10.5)
Chloride: 103 mEq/L (ref 96–112)
Creatinine, Ser: 1.27 mg/dL — ABNORMAL HIGH (ref 0.40–1.20)
GFR: 39.51 mL/min — ABNORMAL LOW (ref >60.00)
Glucose, Bld: 103 mg/dL — ABNORMAL HIGH (ref 70–99)
Potassium: 4.7 mEq/L (ref 3.5–5.1)
Sodium: 139 mEq/L (ref 135–145)
Total Bilirubin: 0.2 mg/dL (ref 0.2–1.2)
Total Protein: 7.5 g/dL (ref 6.0–8.3)

## 2020-05-18 LAB — CBC WITH DIFFERENTIAL/PLATELET
Basophils Absolute: 0.1 10*3/uL (ref 0.0–0.1)
Basophils Relative: 0.7 % (ref 0.0–3.0)
Eosinophils Absolute: 0.2 10*3/uL (ref 0.0–0.7)
Eosinophils Relative: 1.5 % (ref 0.0–5.0)
HCT: 35.6 % — ABNORMAL LOW (ref 36.0–46.0)
Hemoglobin: 11.4 g/dL — ABNORMAL LOW (ref 12.0–15.0)
Lymphocytes Relative: 21.5 % (ref 12.0–46.0)
Lymphs Abs: 2.8 10*3/uL (ref 0.7–4.0)
MCHC: 32 g/dL (ref 30.0–36.0)
MCV: 87.1 fl (ref 78.0–100.0)
Monocytes Absolute: 0.9 10*3/uL (ref 0.1–1.0)
Monocytes Relative: 7.1 % (ref 3.0–12.0)
Neutro Abs: 8.9 10*3/uL — ABNORMAL HIGH (ref 1.4–7.7)
Neutrophils Relative %: 69.2 % (ref 43.0–77.0)
Platelets: 394 10*3/uL (ref 150.0–400.0)
RBC: 4.08 Mil/uL (ref 3.87–5.11)
RDW: 15.8 % — ABNORMAL HIGH (ref 11.5–15.5)
WBC: 12.8 10*3/uL — ABNORMAL HIGH (ref 4.0–10.5)

## 2020-05-18 LAB — LIPID PANEL
Cholesterol: 251 mg/dL — ABNORMAL HIGH (ref 0–200)
HDL: 59 mg/dL (ref >39.00)
LDL Cholesterol: 158 mg/dL — ABNORMAL HIGH (ref 0–99)
NonHDL: 192.37
Total CHOL/HDL Ratio: 4
Triglycerides: 172 mg/dL — ABNORMAL HIGH (ref 0.0–149.0)
VLDL: 34.4 mg/dL (ref 0.0–40.0)

## 2020-05-18 LAB — TSH: TSH: 3.98 u[IU]/mL (ref 0.35–4.50)

## 2020-05-18 LAB — T4, FREE: Free T4: 0.66 ng/dL (ref 0.60–1.60)

## 2020-05-18 LAB — HEMOGLOBIN A1C: Hgb A1c MFr Bld: 6 % (ref 4.6–6.5)

## 2020-05-18 MED ORDER — SERTRALINE HCL 25 MG PO TABS: 25.0000 mg | ORAL_TABLET | Freq: Every day | ORAL | 3 refills | Status: DC

## 2020-05-18 NOTE — Patient Instructions (Addendum)
At today's office visit we discussed decreasing your dose of Zoloft from 50 mg to 25 mg daily.  For increased symptoms of depression we can go back to the 50 mg dose.  The current plan is to try to wean off the medication.    We also discussed the benefits and alternatives of being on  Namenda, the medication for memory.  We discussed discontinuing  the medication.   Vivir con la enfermedad de Alzheimer Living With Alzheimer's Disease La enfermedad de Alzheimer es una enfermedad cerebral que hace que olvide cosas que antes saba. Tambin dificulta prestar atencin, recordar informacin que acaba de recibir, comunicarse y Education officer, environmental las tareas rutinarias. La enfermedad de Alzheimer empeora con el tiempo. En el comienzo de la enfermedad, quiz pueda cuidarse a s mismo; sin embargo, con Museum/gallery conservator, necesitar que alguien lo cuide. Hay medidas que puede tomar para ayudar a Chief Operating Officer su vida mientras vive con esta afeccin. Cmo manejar los cambios en el estilo de vida Manejo de las emociones y el estrs Es normal sentir muchas emociones respecto de esta enfermedad, como miedo, tristeza, enojo y prdida. A continuacin, mencionamos algunas maneras que lo pueden ayudar a Sales promotion account executive emociones:  Acepte las emociones.  Anote sus pensamientos y sentimientos en un diario.  Forme un grupo de apoyo de amigos y familiares que lo ayuden.  nase a un grupo de apoyo para personas con la enfermedad de Alzheimer. Busque formas saludables de Dealer. Intente algo de lo siguiente:  Pasar tiempo con Economist.  Practicar meditacin y hacer ejercicios de respiracin profunda.  Hablar sobre cmo siente.  Escuchar msica.  Realizar trabajos artsticos creativos.  Pensar en qu cosas le resultan ms estresantes. Busque maneras de Multimedia programmer o evitar esas cosas en la medida de lo posible. Pida ayuda a otras personas para Art gallery manager. Adaptacin a los [employer] Corporation cambios provocados por la  enfermedad de Alzheimer pueden ser atemorizantes y desconcertantes. A continuacin, le ofrecemos algunos consejos para que la adaptacin a Actuary resulte un poco ms fcil:  Siga una rutina diaria.  Coloque un calendario en un lugar importante. Anote los eventos y las actividades que tenga en el calendario.  Haga una lista de las cosas que debe hacer.  Concntrese en una tarea por vez.  Organice los medicamentos de cada da de la semana en un Hydrographic surveyor.  Acepte que algunas cosas podran tener q cambiar para su seguridad; por ejemplo, el manejo de vehculos.  Acepte ayuda de otros. No se avergence si necesita ayuda con ciertas tareas.  Elabore un plan para todas las acciones legales o financieras que Tour manager. Asesrese profesionalmente si no est seguro de Journalist, newspaper.   Cmo reconocer el estrs Es normal sentirse estresado de vez en cuando. Estos son algunos de los sntomas del estrs:  Automotive engineer el contacto con Economist.  Irritabilidad, enojo o frustracin.  Negar que tenga la enfermedad.  Dificultad para dormir.  Dificultad para concentrarse.  Ansiedad.  Depresin.  Desarrollar otros problemas de [place]. Siga estas instrucciones en su casa:  Cree una rutina para irse a la cama y dormirse que incluya lo siguiente: ? Dormir en una habitacin fresca. ? Usar cortinas para Clinical cytogeneticist las ventanas del dormitorio. ? Durante las horas previas a acostarse, no realizar actividad fsica ni comer.  Haga ejercicio con regularidad.  Expngase a la Leisure centre manager.  Utilice dispositivos de seguridad, como un bastn o un caminador, si tiene problemas de equilibrio.  Tenga un plan de seguridad para casos de emergencias. Considere la posibilidad de usar un sistema de Control and instrumentation engineeralerta de seguridad que le permita obtener ayuda con rapidez.  Evite la cafena y el alcohol.  Use los medicamentos de venta libre y los recetados solamente como se lo haya indicado el  mdico.   Dnde buscar Navistar International Corporationapoyo  Amigos y familiares.  Su lugar de culto.  Asesores psicolgicos o terapeutas.  Grupos de apoyo.  Servicios de atencin Scientist, research (life sciences)mdica en el hogar. Dnde buscar ms informacin  Alzheimer's Association (Asociacin de Alzheimer): LimitLaws.huwww.alz.org Comunquese con un mdico si:  No puede cuidarse solo.  Siente que est en peligro. Solicite ayuda de inmediato si:  Tiene pensamientos acerca de Runner, broadcasting/film/videolastimarse.  Se siente deprimido. Si alguna vez siente que puede lastimarse o Physicist, medicallastimar a Economistotras personas, o tiene pensamientos de poner fin a su vida, busque ayuda de inmediato. Dirjase al servicio de urgencias ms cercano o:  Comunquese con el servicio de emergencias de su localidad (911 en los [place]).  Llame a una lnea de asistencia al suicida y atencin en crisis como National Suicide Prevention Lifeline (Lnea Nacional de Prevencin del Suicidio) al [phone]. Est disponible las 24 horas del da en los [place].  Enve un mensaje de texto a la lnea para casos de crisis al [phone] (en los EE.UU.). Resumen  La enfermedad de Alzheimer es una enfermedad cerebral que hace que olvide cosas que antes saba. Tambin dificulta prestar atencin, recordar la informacin nueva que acaba de recibir, comunicarse y Education officer, environmentalrealizar las tareas rutinarias.  La enfermedad de Alzheimer empeora con el tiempo. En el comienzo de la enfermedad, quiz pueda cuidarse a s mismo; sin embargo, con Museum/gallery conservatorel tiempo, necesitar que alguien lo cuide.  Los cambios provocados por la enfermedad de Alzheimer pueden ser atemorizantes y desconcertantes. Es posible que deba realizar cambios en su rutina diaria. Forme un grupo de apoyo de amigos y familiares que lo ayuden. Esta informacin no tiene Theme park managercomo fin reemplazar el consejo del mdico. Asegrese de hacerle al mdico cualquier pregunta que tenga. Document Revised: 07/22/2019 Document Reviewed: 07/22/2019 Elsevier Patient Education  2021 Elsevier  Inc.    Prevenir el aumento de peso no saludable, en adultos Preventing Unhealthy Kinder Morgan EnergyWeight Gain, Adult Mantener un peso saludable es importante para su salud general. Cuando la grasa se acumula en el cuerpo, usted puede convertirse en obeso o tener sobrepeso. Tener sobrepeso o ser obeso, aumenta el riesgo de desarrollar ciertos problemas de Emigrantsalud, como enfermedades cardacas, diabetes, problemas para dormir, problemas articulares y algunos tipos de Database administratorcncer. El aumento de peso no saludable suele ser el resultado de Education officer, environmentalrealizar elecciones de alimentos poco saludables o de no hacer suficiente ejercicio fsico. Puede hacer cambios en su estilo de vida para prevenir la obesidad y mantenerse tan saludable como sea posible. Qu cambios en la alimentacin se pueden realizar?  Coma solo la cantidad que el cuerpo necesita. Para hacer esto: ? Preste atencin a los signos de que tiene [place] o que se siente satisfecho. Deje de comer tan pronto como se sienta satisfecho. ? Si tiene hambre, intente primero beber agua antes de comer. Beba suficiente agua para que la orina sea clara o de color amarillo plido. ? Coma porciones ms pequeas. Preste atencin a los tamaos de las porciones cuando coma afuera. ? Consulte los tamaos de las porciones en las etiquetas de los alimentos. La mayora de los alimentos contiene ms de una porcin por envase. ? Consuma la cantidad recomendada de caloras segn su sexo y  nivel de [place]. Para la [employer], un total diario de 2,000 de caloras es [place]. Si est tratando de perder peso o si no es muy [place], tal vez deba comer menos caloras. Hable con su mdico o un especialista en alimentacin y nutricin (nutricionista) sobre cuntas caloras necesita consumir por Futures trader.  Elija alimentos saludables, por ejemplo: ? Frutas y verduras. En cada comida, trate de que al Coca-Cola mitad del plato sea de frutas y verduras. ? Cereales integrales, como el pan  integral, el arroz integral y Sport and exercise psychologist. ? Carnes magras, como la carne de ave o pescado. ? Otras protenas saludables, como los frijoles, los huevos o el tofu. ? Grasas saludables, como las nueces, las semillas, los pescados grasos y el aceite de [place]. ? Productos lcteos descremados o con bajo contenido de [place].  Revise las etiquetas de los alimentos y evite los alimentos y bebidas que: ? Contienen muchas caloras. ? Tienen agregados de International aid/development worker. ? Contienen Performance Food Group. ? Tienen grasas saturadas o grasas trans.  Cocine los alimentos en formas ms saludables, tales como hornear, Technical sales engineer o asar a la parrilla.  Disee un plan de comidas para la semana y haga las compras con una lista de comestibles para ayudarlo mantenerse encaminado en sus compras. Trate de evitar ir a la tienda de comestibles cuando tenga apetito.  Cuando vaya a Radio producer las compras, intente recorrer la parte de afuera de la tienda primero, donde estn los alimentos frescos. Hacer esto le ayuda a evitar los alimentos preenvasados, los que pueden tener un alto contenido de International aid/development worker, sal (sodio), y [place]   Qu cambios en el estilo de vida se pueden realizar?  Haga de ejercicio o ms, 5das por semana o ms. Hacer ejercicio puede incluir caminar rpido, trabajar en el jardn, andar en bicicleta, correr, nadar y practicar deportes en equipo como baloncesto y ftbol. Pregunte al mdico qu ejercicios son seguros para usted.  Realice actividades de fortalecimiento muscular, tales como levantar pesas o usar bandas de resistencia, 2 o ms das por [address]  No consumir ningn producto que contenga nicotina o tabaco, como cigarrillos y Administrator, Civil Service. Si necesita ayuda para dejar de fumar, consulte al mdico.  Limite el consumo de alcohol a no ms de por da si es mujer y no est [place], y a por da si es hombre. Una medida equivale a 12oz de Financial controller, 5oz de vino o 1oz de bebidas  alcohlicas de alta graduacin.  Trate de dormir entre 7 y 9horas todas las noches.   Qu otros cambios se pueden realizar?  Lleve un registro de los ConocoPhillips come y las actividades que realiza para Chief Operating Officer: ? Lo que comi y cuntas caloras tena. Recuerde contar las caloras en las salsas, los aderezos y las guarniciones. ? Si estuvo activo y qu ejercicios hizo. ? Sus objetivos de [place], [place] y [place].  Controle su peso regularmente. Haga un seguimiento de los [place]. Si observa que ha subido de [place], haga cambios en su dieta o [place].  Evite tomar medicamentos o suplementos para perder peso. Hable con el mdico antes de empezar cualquier medicamento o suplemento nuevo.  Hable con el mdico antes de probar una dieta nueva o un plan nuevo de actividad fsica. Por qu estos cambios son importantes? Comer sano, mantenerse activo y tener hbitos saludables puede ayudarlo a prevenir la obesidad. Estos cambios tambin:  Le ayudan a controlar el estrs y las emociones.  Faylene Kurtz  a sentirse conectado con amigos y familiares.  Mejoran su autoestima.  Mejorar el sueo.  Previenen problemas de salud a Air cabin crew. Qu puede suceder si no se realizan cambios? Ser obeso o tener sobrepeso puede hacer que desarrolle problemas en las articulaciones o en los [place], lo que puede dificultarle mantenerse activo o Education officer, environmental actividades que disfruta. Ser obeso o tener sobrepeso hace que el corazn y los pulmones se esfuercen ms para Printmaker, y [employer] de salud, como diabetes, enfermedad cardaca y ciertos tipos de cnceres. Dnde buscar ms informacin Hable con su mdico o un nutricionista sobre opciones de alimentacin saludable y estilo de vida saludable. Tambin Scientist, clinical (histocompatibility and immunogenetics) informacin en:  MyPlate del Departamento de Estate manager/land agent de los Estados [email or web address].fi  American Heart Association (Asociacin Estadounidense del Cor[place]n):  www.heart.org  Centers for Disease Control and Prevention (Centros para el Control y la Prevencin de Event organiser): FootballExhibition.com.br Resumen  Mantener un peso saludable es importante para su salud general. Conley Rolls ayuda a prevenir ciertas enfermedades y problemas de salud, como enfermedad cardaca, diabetes, trastornos del sueo, problemas articulares y ciertos tipos de Database administrator.  Ser obeso o tener sobrepeso puede hacer que desarrolle problemas en las articulaciones o en los [place], lo que puede dificultarle mantenerse activo o Education officer, environmental actividades que disfruta.  Puede prevenir el aumento de peso no saludable al llevar una dieta saludable, hacer ejercicio regularmente, no fumar, limitar el consumo de alcohol y dormir lo suficiente.  Hable con su mdico o un nutricionista para obtener asesoramiento sobre opciones de alimentacin saludable y estilo de vida saludable. Esta informacin no tiene Theme park manager el consejo del mdico. Asegrese de hacerle al mdico cualquier pregunta que tenga. Document Revised: 05/03/2019 Document Reviewed: 05/03/2019 Elsevier Patient Education  2021 [employer].

## 2020-05-18 NOTE — Progress Notes (Signed)
Subjective:    Patient ID: Ann Cole, female    DOB: 03-11-1940, 80 y.o.   MRN: [id number]  Chief Complaint  Patient presents with  . Weight Check    Weight gain   Pt accompanied by her granddaughter (pt's guardian).  Pt's primary language is Spanish, though she understands some [place].  HPI Patient is a 80 yo female with pmh sig for Alzheimer's dementia, hypothyroidism, HLD, HTN, depression  who was seen today for ongoing concerns.  Pt gaining weight since being at [facility].  Pt currently 209 lbs, was 94 lbs 11/29/2017.  Pt forgets she has eaten.  Has access to snacks at memory care unit.  Will eat other resident's food.  Pt's granddaughter concerned about pt's breathing.  At times makes a whistle sound.  Also inquires about d/c'ing meds that may not be helping including Zoloft 50 mg.  Past Medical History:  Diagnosis Date  . Hyperlipidemia   . Hypertension   . Thyroid disease    Family History  Problem Relation Age of Onset  . Heart disease Mother   . Mental illness Mother   . Stroke Father   . Mental illness Brother   . Heart disease Daughter   . Alcohol abuse Son     No Known Allergies  ROS  Unable to assess 2/2 h/o Alzheimer's dementia    Objective:    Blood pressure 110/76, temperature 97.8 F (36.6 C), temperature source Oral, weight 209 lb (94.8 kg).  Gen. Pleasant, well-nourished, obese, in no distress, normal affect   HEENT: [place]/AT, face symmetric, conjunctiva clear, no scleral icterus, PERRLA, EOMI, nares patent without drainage, pharynx without erythema or exudate.  TMs normal b/l Neck: No JVD, no thyromegaly, no carotid bruits Lungs: no accessory muscle use, CTAB, no wheezes or rales Cardiovascular: RRR, no m/r/g, no peripheral edema Abdomen: BS present, soft, NT/ND Musculoskeletal: No deformities, no cyanosis or clubbing, normal tone Neuro:  A&Ox3, CN II-XII intact, normal gait Skin:  Warm, no lesions/ rash   Wt Readings from Last 3  Encounters:  05/18/20 209 lb (94.8 kg)  07/31/19 182 lb (82.6 kg)  01/05/18 108 lb (49 kg)    Lab Results  Component Value Date   WBC 10.8 (H) 08/25/2018   HGB 10.1 (L) 08/25/2018   HCT 31.9 (L) 08/25/2018   PLT 294 08/25/2018   GLUCOSE 93 08/25/2018   ALT 11 08/23/2018   AST 16 08/23/2018   NA 137 08/25/2018   K 4.2 08/25/2018   CL 107 08/25/2018   CREATININE 0.99 08/25/2018   BUN 21 08/25/2018   CO2 23 08/25/2018   TSH 2.100 08/24/2018    Assessment/Plan:  Alzheimer's dementia without behavioral disturbance, unspecified timing of dementia onset (HCC)  -stable -continue memantidine 5 mg BID and donepezil 10 mg qhs - Plan: CBC with Differential/Platelet, sertraline (ZOLOFT) 25 MG tablet  Weight gain  -115 lb wt gain since 2019 -likely due to pt forgetting she has eaten -discussed ways to reduce access to snacks, but challenging at facility.  Advised to discuss with staff at memory unity/provide healthy snack options. -can encourage pt to participate in activities to distract her from eating. - Plan: TSH, T4, Free, Hemoglobin A1c  Acquired hypothyroidism -continue synthroid 100 mcg daily.  Dose increased from 88 to 100 by provider at [facility]. -will adjust dose if needed based on lab results.  - Plan: TSH, T4, Free  Mixed hyperlipidemia  - Plan: Lipid panel  Essential hypertension  -controlled -continue Toprol  Xl 50 mg daily - Plan: CMP  Depression, recurrent (HCC)  -stable -will reduce dose of Zoloft from 50 mg to 25 mg daily.  If tolerated will continue to wean medication. - Plan: sertraline (ZOLOFT) 25 MG tablet  F/u in 1 month  Abbe Amsterdam, MD

## 2020-05-25 NOTE — Progress Notes (Signed)
Patient viewed results on MyChart. 

## 2020-06-02 ENCOUNTER — Encounter: Payer: Self-pay | Admitting: Family Medicine

## 2020-06-29 ENCOUNTER — Telehealth: Payer: Self-pay | Admitting: Family Medicine

## 2020-06-29 NOTE — Telephone Encounter (Signed)
Left message for patient to call back and schedule Medicare Annual Wellness Visit (AWV) either virtually or in office.   awvi 01/17/09 per [facility]   please schedule at anytime with [facility] Nurse Health Advisor 1 or 2   This should be a 45 minute visit.  

## 2020-08-03 ENCOUNTER — Telehealth: Payer: Self-pay | Admitting: Family Medicine

## 2020-08-03 NOTE — Telephone Encounter (Signed)
Left message for patient to call back and schedule Medicare Annual Wellness Visit (AWV) either virtually or in office.   awvi 01/17/09 per [facility]   please schedule at anytime with [facility] Nurse Health Advisor 1 or 2   This should be a 45 minute visit.  

## 2021-04-26 ENCOUNTER — Encounter: Payer: Self-pay | Admitting: Family Medicine

## 2021-04-26 ENCOUNTER — Encounter (HOSPITAL_COMMUNITY): Payer: Self-pay | Admitting: Emergency Medicine

## 2021-04-26 ENCOUNTER — Inpatient Hospital Stay (HOSPITAL_COMMUNITY)
Admission: EM | Admit: 2021-04-26 | Discharge: 2021-04-28 | DRG: 194 | Disposition: A | Discharge status: Skilled Nursing Facility | Payer: Medicare Other | Source: Skilled Nursing Facility | Attending: Family Medicine | Admitting: Family Medicine

## 2021-04-26 ENCOUNTER — Emergency Department (HOSPITAL_COMMUNITY): Payer: Medicare Other

## 2021-04-26 ENCOUNTER — Other Ambulatory Visit: Payer: Self-pay

## 2021-04-26 DIAGNOSIS — D649 Anemia, unspecified: Secondary | ICD-10-CM

## 2021-04-26 DIAGNOSIS — Z8249 Family history of ischemic heart disease and other diseases of the circulatory system: Secondary | ICD-10-CM | POA: Diagnosis not present

## 2021-04-26 DIAGNOSIS — Z79899 Other long term (current) drug therapy: Secondary | ICD-10-CM | POA: Diagnosis not present

## 2021-04-26 DIAGNOSIS — Z79890 Hormone replacement therapy (postmenopausal): Secondary | ICD-10-CM | POA: Diagnosis not present

## 2021-04-26 DIAGNOSIS — I5033 Acute on chronic diastolic (congestive) heart failure: Secondary | ICD-10-CM | POA: Diagnosis present

## 2021-04-26 DIAGNOSIS — S42291A Other displaced fracture of upper end of right humerus, initial encounter for closed fracture: Secondary | ICD-10-CM

## 2021-04-26 DIAGNOSIS — F0283 Dementia in other diseases classified elsewhere, unspecified severity, with mood disturbance: Secondary | ICD-10-CM | POA: Diagnosis present

## 2021-04-26 DIAGNOSIS — N3000 Acute cystitis without hematuria: Secondary | ICD-10-CM

## 2021-04-26 DIAGNOSIS — I10 Essential (primary) hypertension: Secondary | ICD-10-CM

## 2021-04-26 DIAGNOSIS — E785 Hyperlipidemia, unspecified: Secondary | ICD-10-CM | POA: Diagnosis present

## 2021-04-26 DIAGNOSIS — E039 Hypothyroidism, unspecified: Secondary | ICD-10-CM | POA: Diagnosis present

## 2021-04-26 DIAGNOSIS — J189 Pneumonia, unspecified organism: Principal | ICD-10-CM | POA: Diagnosis present

## 2021-04-26 DIAGNOSIS — I5032 Chronic diastolic (congestive) heart failure: Secondary | ICD-10-CM | POA: Diagnosis present

## 2021-04-26 DIAGNOSIS — E079 Disorder of thyroid, unspecified: Secondary | ICD-10-CM

## 2021-04-26 DIAGNOSIS — G309 Alzheimer's disease, unspecified: Secondary | ICD-10-CM | POA: Diagnosis present

## 2021-04-26 DIAGNOSIS — I110 Hypertensive heart disease with heart failure: Secondary | ICD-10-CM | POA: Diagnosis present

## 2021-04-26 DIAGNOSIS — N390 Urinary tract infection, site not specified: Secondary | ICD-10-CM

## 2021-04-26 DIAGNOSIS — D509 Iron deficiency anemia, unspecified: Secondary | ICD-10-CM | POA: Diagnosis present

## 2021-04-26 DIAGNOSIS — E559 Vitamin D deficiency, unspecified: Secondary | ICD-10-CM | POA: Diagnosis present

## 2021-04-26 DIAGNOSIS — I480 Paroxysmal atrial fibrillation: Secondary | ICD-10-CM | POA: Diagnosis present

## 2021-04-26 DIAGNOSIS — Z20822 Contact with and (suspected) exposure to covid-19: Secondary | ICD-10-CM | POA: Diagnosis present

## 2021-04-26 DIAGNOSIS — Z7982 Long term (current) use of aspirin: Secondary | ICD-10-CM | POA: Diagnosis not present

## 2021-04-26 HISTORY — DX: Unspecified dementia, unspecified severity, without behavioral disturbance, psychotic disturbance, mood disturbance, and anxiety: F03.90

## 2021-04-26 HISTORY — DX: Chronic diastolic (congestive) heart failure: I50.32

## 2021-04-26 HISTORY — DX: Paroxysmal atrial fibrillation: I48.0

## 2021-04-26 LAB — MAGNESIUM: Magnesium: 2.2 mg/dL (ref 1.7–2.4)

## 2021-04-26 LAB — COMPREHENSIVE METABOLIC PANEL
ALT: 29 U/L (ref 0–44)
AST: 24 U/L (ref 15–41)
Albumin: 3.1 g/dL — ABNORMAL LOW (ref 3.5–5.0)
Alkaline Phosphatase: 108 U/L (ref 38–126)
Anion gap: 7 (ref 5–15)
BUN: 23 mg/dL (ref 8–23)
CO2: 25 mmol/L (ref 22–32)
Calcium: 8.4 mg/dL — ABNORMAL LOW (ref 8.9–10.3)
Chloride: 103 mmol/L (ref 98–111)
Creatinine, Ser: 1.2 mg/dL — ABNORMAL HIGH (ref 0.44–1.00)
GFR, Estimated: 45 mL/min — ABNORMAL LOW (ref >60)
Glucose, Bld: 131 mg/dL — ABNORMAL HIGH (ref 70–99)
Potassium: 4.3 mmol/L (ref 3.5–5.1)
Sodium: 135 mmol/L (ref 135–145)
Total Bilirubin: 0.7 mg/dL (ref 0.3–1.2)
Total Protein: 7.7 g/dL (ref 6.5–8.1)

## 2021-04-26 LAB — CBC WITH DIFFERENTIAL/PLATELET
Abs Immature Granulocytes: 0.03 10*3/uL (ref 0.00–0.07)
Basophils Absolute: 0.1 10*3/uL (ref 0.0–0.1)
Basophils Relative: 1 %
Eosinophils Absolute: 0.1 10*3/uL (ref 0.0–0.5)
Eosinophils Relative: 1 %
HCT: 29.6 % — ABNORMAL LOW (ref 36.0–46.0)
Hemoglobin: 9.1 g/dL — ABNORMAL LOW (ref 12.0–15.0)
Immature Granulocytes: 0 %
Lymphocytes Relative: 30 %
Lymphs Abs: 2.7 10*3/uL (ref 0.7–4.0)
MCH: 26.1 pg (ref 26.0–34.0)
MCHC: 30.7 g/dL (ref 30.0–36.0)
MCV: 84.8 fL (ref 80.0–100.0)
Monocytes Absolute: 1 10*3/uL (ref 0.1–1.0)
Monocytes Relative: 11 %
Neutro Abs: 5.2 10*3/uL (ref 1.7–7.7)
Neutrophils Relative %: 57 %
Platelets: 396 10*3/uL (ref 150–400)
RBC: 3.49 MIL/uL — ABNORMAL LOW (ref 3.87–5.11)
RDW: 16.4 % — ABNORMAL HIGH (ref 11.5–15.5)
WBC: 9.2 10*3/uL (ref 4.0–10.5)
nRBC: 0 % (ref 0.0–0.2)

## 2021-04-26 LAB — URINALYSIS, ROUTINE W REFLEX MICROSCOPIC
Bilirubin Urine: NEGATIVE
Glucose, UA: NEGATIVE mg/dL
Ketones, ur: NEGATIVE mg/dL
Nitrite: POSITIVE — AB
Protein, ur: NEGATIVE mg/dL
Specific Gravity, Urine: 1.011 (ref 1.005–1.030)
pH: 5 (ref 5.0–8.0)

## 2021-04-26 LAB — RESP PANEL BY RT-PCR (FLU A&B, COVID) ARPGX2
Influenza A by PCR: NEGATIVE
Influenza B by PCR: NEGATIVE
SARS Coronavirus 2 by RT PCR: NEGATIVE

## 2021-04-26 LAB — TROPONIN I (HIGH SENSITIVITY): Troponin I (High Sensitivity): 18 ng/L — ABNORMAL HIGH (ref <18)

## 2021-04-26 MED ORDER — SODIUM CHLORIDE 0.9 % IV BOLUS
1000.0000 mL | Freq: Once | INTRAVENOUS | Status: AC
  Administered 2021-04-26: 1000 mL via INTRAVENOUS

## 2021-04-26 MED ORDER — ACETAMINOPHEN 650 MG RE SUPP
650.0000 mg | Freq: Four times a day (QID) | RECTAL | Status: DC | PRN
Start: 2021-04-26 — End: 2021-04-28

## 2021-04-26 MED ORDER — SODIUM CHLORIDE 0.9 % IV SOLN
2.0000 g | Freq: Once | INTRAVENOUS | Status: AC
  Administered 2021-04-26: 2 g via INTRAVENOUS
  Filled 2021-04-26: qty 20

## 2021-04-26 MED ORDER — AZITHROMYCIN 500 MG IV SOLR
500.0000 mg | INTRAVENOUS | Status: DC
  Filled 2021-04-26: qty 5

## 2021-04-26 MED ORDER — ACETAMINOPHEN 325 MG PO TABS
650.0000 mg | ORAL_TABLET | Freq: Four times a day (QID) | ORAL | Status: DC | PRN
  Administered 2021-04-27 – 2021-04-28 (×4): 650 mg via ORAL
  Filled 2021-04-26 (×4): qty 2

## 2021-04-26 MED ORDER — AZITHROMYCIN 500 MG IV SOLR
500.0000 mg | Freq: Once | INTRAVENOUS | Status: AC
  Administered 2021-04-26: 500 mg via INTRAVENOUS
  Filled 2021-04-26: qty 5

## 2021-04-26 MED ORDER — SODIUM CHLORIDE 0.9 % IV SOLN
1.0000 g | INTRAVENOUS | Status: DC
  Administered 2021-04-27: 1 g via INTRAVENOUS
  Filled 2021-04-26 (×2): qty 10

## 2021-04-26 NOTE — ED Notes (Signed)
Pt ambulated pt to the restroom, pt walked independently, o2 stayed about 93%  ?

## 2021-04-26 NOTE — ED Triage Notes (Signed)
Patient presents form [facility] due to a new diagnosis of pneumonia found on chest xray today. Her MD wanted her evaluated here and treated.  ? ? ?HX: Alzheimer Dementia ? ? ?EMS vitals: ?148/70 BP ?100 HR ?20 RR ?96% SPO2 on room air ?

## 2021-04-26 NOTE — ED Provider Notes (Signed)
?Ann Cole ?Provider Note ? ? ?CSN: [id number] ?Arrival date & time: 04/26/21  1542 ? ?  ? ?History ? ?Chief Complaint  ?Patient presents with  ? Fever  ? ? ?Ann Cole is a 81 y.o. female. With past medical history of dementia, hypertension who presents to the emergency department with concern for pneumonia. ? ?She is from [facility]. Per EMS patient had chest x-ray showing pneumonia. MD wanted her to be evaluated in ED and treated.  ?LEVEL 5 CAVEAT: DEMENTIA  ?History taken with interpreter ? ?Patient states she is at the hospital, her name, she is unsure of the year or month.  She endorses that she has had a cold over the past week.  She is not aware that she saw her doctor and they were concerned about pneumonia.  She does endorse cough and some mild shortness of breath.  She denies any known fevers or chest pain.  Denies nausea, vomiting or diarrhea.  States that she has been eating and drinking. ? ?Spoke with [facility], Sheran Luz Hester: cough, thick mucous that she describes as yellow. No fever. States she has been eating and drinking less over the past few days. No N/V. Also complaining of chest pain.  ? ? ?Fever ?Associated symptoms: cough   ?Associated symptoms: no chest pain, no diarrhea, no dysuria, no nausea and no vomiting   ? ?  ? ?Home Medications ?Prior to Admission medications   ?Medication Sig Start Date End Date Taking? Authorizing Provider  ?acetaminophen (TYLENOL) 500 MG tablet Take 500 mg by mouth every 6 (six) hours as needed for mild pain, fever or headache.    [provider]  ?alum & mag hydroxide-simeth (MAALOX/MYLANTA) 200-200-20 MG/5ML suspension Take 30 mLs by mouth every 6 (six) hours as needed for indigestion or heartburn (NOT TO EXCEED 4 DOSES IN 24HRS).    [provider]  ?aspirin EC 325 MG EC tablet Take 1 tablet (325 mg total) by mouth daily. 08/25/18   Bonnielee Haff, MD  ?cephALEXin (KEFLEX) 500 MG capsule  Take 1 capsule (500 mg total) by mouth 3 (three) times daily. 07/31/19   Horton, Barbette Hair, MD  ?donepezil (ARICEPT) 10 MG tablet Take 1 tablet (10 mg total) by mouth at bedtime. 11/29/17   Billie Ruddy, MD  ?guaifenesin (ROBITUSSIN) 100 MG/5ML syrup Take 200 mg by mouth every 6 (six) hours as needed for cough.    [provider]  ?levothyroxine (SYNTHROID) 100 MCG tablet Take 100 mcg by mouth daily. 04/14/20   [provider]  ?loperamide (IMODIUM) 2 MG capsule Take 2 mg by mouth as needed for diarrhea or loose stools (DO NOT EXCEED 8 DOSES IN 24HRS).    [provider]  ?magnesium hydroxide (MILK OF MAGNESIA) 400 MG/5ML suspension Take 5 mLs by mouth daily as needed for mild constipation.    [provider]  ?Melatonin 5 MG TABS Take 5 mg by mouth at bedtime.    [provider]  ?memantine (NAMENDA) 5 MG tablet Take 1 tablet (5 mg total) by mouth 2 (two) times daily. 11/29/17   Billie Ruddy, MD  ?metoprolol succinate (TOPROL-XL) 50 MG 24 hr tablet Take 1 tablet (50 mg total) by mouth daily. Take with or immediately following a meal. 08/25/18   Bonnielee Haff, MD  ?neomycin-bacitracin-polymyxin (NEOSPORIN) [phone] ointment Apply 1 application topically 4 (four) times daily.    [provider]  ?Nutritional Supplements (NUTRITIONAL SHAKE PO) Take 1 Can by  mouth 3 (three) times daily.    [provider]  ?sertraline (ZOLOFT) 25 MG tablet Take 1 tablet (25 mg total) by mouth daily. 05/18/20   Billie Ruddy, MD  ?   ? ?Allergies    ?Patient has no known allergies.   ? ?Review of Systems   ?Review of Systems  ?Constitutional:  Positive for fever.  ?Respiratory:  Positive for cough and shortness of breath.   ?Cardiovascular:  Negative for chest pain and leg swelling.  ?Gastrointestinal:  Negative for abdominal pain, diarrhea, nausea and vomiting.  ?Genitourinary:  Negative for dysuria.  ?All other systems reviewed and are negative. ? ?Physical  Exam ?Updated Vital Signs ?BP (!) 156/86   Pulse 90   Temp 98.5 ?F (36.9 ?C) (Oral)   Resp (!) 22   SpO2 98%  ?Physical Exam ?Vitals and nursing note reviewed.  ?Constitutional:   ?   General: She is not in acute distress. ?   Appearance: Normal appearance. She is obese. She is ill-appearing. She is not toxic-appearing.  ?HENT:  ?   Head: Normocephalic and atraumatic.  ?   Nose: Nose normal.  ?   Mouth/Throat:  ?   Mouth: Mucous membranes are moist.  ?   Pharynx: Oropharynx is clear.  ?Eyes:  ?   General: No scleral icterus. ?   Extraocular Movements: Extraocular movements intact.  ?Cardiovascular:  ?   Rate and Rhythm: Normal rate and regular rhythm.  ?   Pulses: Normal pulses.  ?   Heart sounds: No murmur heard. ?Pulmonary:  ?   Effort: Tachypnea present. No accessory muscle usage or respiratory distress.  ?   Breath sounds: Examination of the right-upper field reveals rhonchi. Examination of the right-middle field reveals rhonchi. Examination of the right-lower field reveals rales. Examination of the left-lower field reveals rales. Rhonchi and rales present. No wheezing.  ?Chest:  ?   Chest wall: No tenderness.  ?Abdominal:  ?   General: Bowel sounds are normal. There is no distension.  ?   Palpations: Abdomen is soft.  ?   Tenderness: There is no abdominal tenderness.  ?Musculoskeletal:     ?   General: Normal range of motion.  ?   Cervical back: Neck supple.  ?   Right lower leg: Edema present.  ?   Left lower leg: Edema present.  ?Skin: ?   General: Skin is warm and dry.  ?   Capillary Refill: Capillary refill takes less than 2 seconds.  ?Neurological:  ?   General: No focal deficit present.  ?   Mental Status: She is alert. Mental status is at baseline.  ?   Cranial Nerves: No cranial nerve deficit.  ? ? ?ED Results / Procedures / Treatments   ?Labs ?(all labs ordered are listed, but only abnormal results are displayed) ?Labs Reviewed  ?CBC WITH DIFFERENTIAL/PLATELET - Abnormal; Notable for the following  components:  ?    Result Value  ? RBC 3.49 (*)   ? Hemoglobin 9.1 (*)   ? HCT 29.6 (*)   ? RDW 16.4 (*)   ? All other components within normal limits  ?COMPREHENSIVE METABOLIC PANEL - Abnormal; Notable for the following components:  ? Glucose, Bld 131 (*)   ? Creatinine, Ser 1.20 (*)   ? Calcium 8.4 (*)   ? Albumin 3.1 (*)   ? GFR, Estimated 45 (*)   ? All other components within normal limits  ?URINALYSIS, ROUTINE W REFLEX MICROSCOPIC - Abnormal; Notable  for the following components:  ? APPearance HAZY (*)   ? Hgb urine dipstick SMALL (*)   ? Nitrite POSITIVE (*)   ? Leukocytes,Ua SMALL (*)   ? Bacteria, UA MANY (*)   ? All other components within normal limits  ?TROPONIN I (HIGH SENSITIVITY) - Abnormal; Notable for the following components:  ? Troponin I (High Sensitivity) 18 (*)   ? All other components within normal limits  ?RESP PANEL BY RT-PCR (FLU A&B, COVID) [id number]  ?CULTURE, BLOOD (ROUTINE X 2)  ?CULTURE, BLOOD (ROUTINE X 2)  ?MAGNESIUM  ?MAGNESIUM  ?COMPREHENSIVE METABOLIC PANEL  ?CBC WITH DIFFERENTIAL/PLATELET  ?PHOSPHORUS  ?TROPONIN I (HIGH SENSITIVITY)  ? ?EKG ?EKG Interpretation ? ?Date/Time:  Monday April 26 2021 16:42:20 EDT ?Ventricular Rate:  98 ?PR Interval:  149 ?QRS Duration: 85 ?QT Interval:  355 ?QTC Calculation: [id number] ?R Axis:   6 ?Text Interpretation: Ectopic atrial rhythm Abnormal R-wave progression, early transition ST depr, consider ischemia, inferior leads Confirmed by Regan Lemming ([id number]) on 04/26/2021 5:49:06 PM ? ?Radiology ?DG Chest Portable 1 View ? ?Result Date: 04/26/2021 ?CLINICAL DATA:  Shortness of breath. New diagnosis of pneumonia found on outside chest radiograph today. EXAM: PORTABLE CHEST 1 VIEW COMPARISON:  AP chest 08/23/2018 FINDINGS: Cardiac silhouette is again mildly to moderately enlarged. Mild calcification within aortic arch. Heterogeneous airspace opacity within the lateral right midlung quadrant likely at the inferolateral aspect of the right upper lobe, new from  08/23/2018 radiograph and CT. The left lung is clear. No pleural effusion or pneumothorax. New but chronic appearing displaced fracture of the right humeral head-neck junction with moderate bone overlap and medial

## 2021-04-27 ENCOUNTER — Encounter (HOSPITAL_COMMUNITY): Payer: Self-pay | Admitting: Internal Medicine

## 2021-04-27 DIAGNOSIS — J189 Pneumonia, unspecified organism: Secondary | ICD-10-CM | POA: Diagnosis not present

## 2021-04-27 DIAGNOSIS — N3000 Acute cystitis without hematuria: Secondary | ICD-10-CM | POA: Diagnosis present

## 2021-04-27 DIAGNOSIS — E079 Disorder of thyroid, unspecified: Secondary | ICD-10-CM | POA: Diagnosis present

## 2021-04-27 DIAGNOSIS — E039 Hypothyroidism, unspecified: Secondary | ICD-10-CM | POA: Diagnosis present

## 2021-04-27 DIAGNOSIS — I5032 Chronic diastolic (congestive) heart failure: Secondary | ICD-10-CM | POA: Diagnosis present

## 2021-04-27 DIAGNOSIS — I5033 Acute on chronic diastolic (congestive) heart failure: Secondary | ICD-10-CM | POA: Diagnosis present

## 2021-04-27 DIAGNOSIS — S42291A Other displaced fracture of upper end of right humerus, initial encounter for closed fracture: Secondary | ICD-10-CM | POA: Diagnosis present

## 2021-04-27 DIAGNOSIS — D649 Anemia, unspecified: Secondary | ICD-10-CM | POA: Diagnosis present

## 2021-04-27 DIAGNOSIS — I480 Paroxysmal atrial fibrillation: Secondary | ICD-10-CM | POA: Diagnosis present

## 2021-04-27 LAB — IRON AND TIBC
Iron: 25 ug/dL — ABNORMAL LOW (ref 28–170)
Saturation Ratios: 6 % — ABNORMAL LOW (ref 10.4–31.8)
TIBC: 416 ug/dL (ref 250–450)
UIBC: 391 ug/dL

## 2021-04-27 LAB — CBC WITH DIFFERENTIAL/PLATELET
Abs Immature Granulocytes: 0.11 10*3/uL — ABNORMAL HIGH (ref 0.00–0.07)
Basophils Absolute: 0.1 10*3/uL (ref 0.0–0.1)
Basophils Relative: 1 %
Eosinophils Absolute: 0.1 10*3/uL (ref 0.0–0.5)
Eosinophils Relative: 1 %
HCT: 26.2 % — ABNORMAL LOW (ref 36.0–46.0)
Hemoglobin: 8.1 g/dL — ABNORMAL LOW (ref 12.0–15.0)
Immature Granulocytes: 1 %
Lymphocytes Relative: 31 %
Lymphs Abs: 3.1 10*3/uL (ref 0.7–4.0)
MCH: 25.9 pg — ABNORMAL LOW (ref 26.0–34.0)
MCHC: 30.9 g/dL (ref 30.0–36.0)
MCV: 83.7 fL (ref 80.0–100.0)
Monocytes Absolute: 1 10*3/uL (ref 0.1–1.0)
Monocytes Relative: 10 %
Neutro Abs: 5.7 10*3/uL (ref 1.7–7.7)
Neutrophils Relative %: 56 %
Platelets: 377 10*3/uL (ref 150–400)
RBC: 3.13 MIL/uL — ABNORMAL LOW (ref 3.87–5.11)
RDW: 16.2 % — ABNORMAL HIGH (ref 11.5–15.5)
WBC: 10 10*3/uL (ref 4.0–10.5)
nRBC: 0 % (ref 0.0–0.2)

## 2021-04-27 LAB — COMPREHENSIVE METABOLIC PANEL
ALT: 40 U/L (ref 0–44)
AST: 34 U/L (ref 15–41)
Albumin: 2.9 g/dL — ABNORMAL LOW (ref 3.5–5.0)
Alkaline Phosphatase: 107 U/L (ref 38–126)
Anion gap: 9 (ref 5–15)
BUN: 18 mg/dL (ref 8–23)
CO2: 22 mmol/L (ref 22–32)
Calcium: 8.3 mg/dL — ABNORMAL LOW (ref 8.9–10.3)
Chloride: 107 mmol/L (ref 98–111)
Creatinine, Ser: 0.99 mg/dL (ref 0.44–1.00)
GFR, Estimated: 57 mL/min — ABNORMAL LOW (ref >60)
Glucose, Bld: 93 mg/dL (ref 70–99)
Potassium: 4.2 mmol/L (ref 3.5–5.1)
Sodium: 138 mmol/L (ref 135–145)
Total Bilirubin: 0.4 mg/dL (ref 0.3–1.2)
Total Protein: 7 g/dL (ref 6.5–8.1)

## 2021-04-27 LAB — PROTIME-INR
INR: 1.1 (ref 0.8–1.2)
Prothrombin Time: 13.6 seconds (ref 11.4–15.2)

## 2021-04-27 LAB — PHOSPHORUS: Phosphorus: 3.4 mg/dL (ref 2.5–4.6)

## 2021-04-27 LAB — VITAMIN D 25 HYDROXY (VIT D DEFICIENCY, FRACTURES): Vit D, 25-Hydroxy: 15.09 ng/mL — ABNORMAL LOW (ref 30–100)

## 2021-04-27 LAB — RETICULOCYTES
Immature Retic Fract: 18.9 % — ABNORMAL HIGH (ref 2.3–15.9)
RBC.: 3.16 MIL/uL — ABNORMAL LOW (ref 3.87–5.11)
Retic Count, Absolute: 53.4 10*3/uL (ref 19.0–186.0)
Retic Ct Pct: 1.7 % (ref 0.4–3.1)

## 2021-04-27 LAB — BRAIN NATRIURETIC PEPTIDE: B Natriuretic Peptide: 176.5 pg/mL — ABNORMAL HIGH (ref 0.0–100.0)

## 2021-04-27 LAB — STREP PNEUMONIAE URINARY ANTIGEN: Strep Pneumo Urinary Antigen: NEGATIVE

## 2021-04-27 LAB — FOLATE: Folate: 24 ng/mL (ref >5.9)

## 2021-04-27 LAB — FERRITIN: Ferritin: 42 ng/mL (ref 11–307)

## 2021-04-27 LAB — MAGNESIUM: Magnesium: 2.3 mg/dL (ref 1.7–2.4)

## 2021-04-27 LAB — PROCALCITONIN: Procalcitonin: <0.1 ng/mL

## 2021-04-27 MED ORDER — IPRATROPIUM-ALBUTEROL 0.5-2.5 (3) MG/3ML IN SOLN
3.0000 mL | Freq: Four times a day (QID) | RESPIRATORY_TRACT | Status: AC | PRN
  Administered 2021-04-27 (×2): 3 mL via RESPIRATORY_TRACT
  Filled 2021-04-27 (×2): qty 3

## 2021-04-27 MED ORDER — LEVOTHYROXINE SODIUM 25 MCG PO TABS: 25.0000 ug | ORAL_TABLET | ORAL | Status: DC

## 2021-04-27 MED ORDER — METOPROLOL SUCCINATE ER 50 MG PO TB24
50.0000 mg | ORAL_TABLET | Freq: Every day | ORAL | Status: DC
  Administered 2021-04-27: 50 mg via ORAL
  Filled 2021-04-27: qty 1

## 2021-04-27 MED ORDER — LEVOTHYROXINE SODIUM 25 MCG PO TABS
125.0000 ug | ORAL_TABLET | Freq: Every day | ORAL | Status: DC
  Administered 2021-04-28: 125 ug via ORAL
  Filled 2021-04-27: qty 1

## 2021-04-27 MED ORDER — LEVOTHYROXINE SODIUM 100 MCG PO TABS: 100.0000 ug | ORAL_TABLET | Freq: Every day | ORAL | Status: DC

## 2021-04-27 MED ORDER — LACTATED RINGERS IV SOLN: INTRAVENOUS | Status: DC

## 2021-04-27 MED ORDER — SODIUM CHLORIDE 0.9 % IV SOLN
250.0000 mg | Freq: Once | INTRAVENOUS | Status: AC
  Administered 2021-04-27: 250 mg via INTRAVENOUS
  Filled 2021-04-27: qty 20

## 2021-04-27 MED ORDER — VITAMIN D (ERGOCALCIFEROL) 1.25 MG (50000 UNIT) PO CAPS
50000.0000 [IU] | ORAL_CAPSULE | ORAL | Status: DC
  Administered 2021-04-27: 50000 [IU] via ORAL
  Filled 2021-04-27: qty 1

## 2021-04-27 MED ORDER — SODIUM CHLORIDE 0.9 % IV SOLN
500.0000 mg | INTRAVENOUS | Status: DC
  Administered 2021-04-27: 500 mg via INTRAVENOUS
  Filled 2021-04-27 (×2): qty 5

## 2021-04-27 MED ORDER — IPRATROPIUM-ALBUTEROL 0.5-2.5 (3) MG/3ML IN SOLN
3.0000 mL | Freq: Three times a day (TID) | RESPIRATORY_TRACT | Status: DC
  Administered 2021-04-28: 3 mL via RESPIRATORY_TRACT
  Filled 2021-04-27: qty 3

## 2021-04-27 MED ORDER — ASCORBIC ACID 500 MG PO TABS
500.0000 mg | ORAL_TABLET | Freq: Every day | ORAL | Status: DC
  Administered 2021-04-27 – 2021-04-28 (×2): 500 mg via ORAL
  Filled 2021-04-27 (×2): qty 1

## 2021-04-27 MED ORDER — DONEPEZIL HCL 10 MG PO TABS: 10.0000 mg | ORAL_TABLET | Freq: Every day | ORAL | Status: DC

## 2021-04-27 MED ORDER — ASPIRIN EC 325 MG PO TBEC: 325.0000 mg | DELAYED_RELEASE_TABLET | Freq: Every day | ORAL | Status: DC

## 2021-04-27 MED ORDER — FERROUS SULFATE 325 (65 FE) MG PO TABS
325.0000 mg | ORAL_TABLET | Freq: Every day | ORAL | Status: DC
Start: 2021-04-28 — End: 2021-04-28
  Administered 2021-04-27 – 2021-04-28 (×2): 325 mg via ORAL
  Filled 2021-04-27 (×2): qty 1

## 2021-04-27 MED ORDER — MEMANTINE HCL 10 MG PO TABS
5.0000 mg | ORAL_TABLET | Freq: Two times a day (BID) | ORAL | Status: DC
Start: 2021-04-27 — End: 2021-04-28
  Administered 2021-04-27 – 2021-04-28 (×3): 5 mg via ORAL
  Filled 2021-04-27 (×3): qty 1

## 2021-04-27 MED ORDER — SERTRALINE HCL 50 MG PO TABS
50.0000 mg | ORAL_TABLET | Freq: Every morning | ORAL | Status: DC
  Administered 2021-04-27 – 2021-04-28 (×2): 50 mg via ORAL
  Filled 2021-04-27 (×2): qty 1

## 2021-04-27 NOTE — NC FL2 (Signed)
?[place] MEDICAID FL2 LEVEL OF CARE SCREENING TOOL  ?  ? ?IDENTIFICATION  ?Patient Name: ?Ann Cole Birthdate: Oct 09, 1940 Sex: female Admission Date (Current Location): ?04/26/2021  ?[place] and [place] Number: ? [place] ?  Facility and Address:  ?[facility],  [address], [place] ?     Provider Number: ?[id number]  ?Attending Physician Name and Address:  ?Leslee Home, DO ? Relative Name and Phone Number:  ?Ann Cole (grand dtr) [phone] ?   ?Current Level of Care: ?Hospital Recommended Level of Care: ?[place], Memory Care Prior Approval Number: ?  ? ?Date Approved/Denied: ?  PASRR Number: ?  ? ?Discharge Plan: ?Other (Comment) (ALF-Memory Care) ?  ? ?Current Diagnoses: ?Patient Active Problem List  ? Diagnosis Date Noted  ? Acute cystitis 04/27/2021  ? Acute on chronic anemia 04/27/2021  ? Humeral head fracture, right, closed, initial encounter 04/27/2021  ? Thyroid disease   ? Paroxysmal atrial fibrillation (HCC)   ? Chronic diastolic heart failure ([place])   ? CAP (community acquired pneumonia) 04/26/2021  ? Syncope 08/23/2018  ? COVID-19 virus infection 08/23/2018  ? Dementia ([place]) 01/05/2018  ? Essential hypertension 01/05/2018  ? ? ?Orientation RESPIRATION BLADDER Height & Weight   ?  ?Self ? Normal Continent Weight: 96.8 kg ?Height:     ?BEHAVIORAL SYMPTOMS/MOOD NEUROLOGICAL BOWEL NUTRITION STATUS  ?    Continent Diet (Regular)  ?AMBULATORY STATUS COMMUNICATION OF NEEDS Skin   ?Independent Verbally Normal ?  ?  ?  ?    ?     ?     ? ? ?Personal Care Assistance Level of Assistance  ?Bathing, Feeding, Dressing Bathing Assistance: Independent ?Feeding assistance: Independent ?Dressing Assistance: Limited assistance ?   ? ?Functional Limitations Info  ?Sight, Hearing, Speech Sight Info: Adequate ?Hearing Info: Adequate ?Speech Info: Adequate  ? ? ?SPECIAL CARE FACTORS FREQUENCY  ?    ?  ?  ?  ?  ?  ?  ?   ? ? ?Contractures Contractures Info: Not  present  ? ? ?Additional Factors Info  ?Code Status, Allergies Code Status Info:  (Full) ?Allergies Info:  (NKA) ?  ?  ?  ?   ? ?Current Medications (04/27/2021):  This is the current hospital active medication list ?Current Facility-Administered Medications  ?Medication Dose Route Frequency Provider Last Rate Last Admin  ? acetaminophen (TYLENOL) tablet 650 mg  650 mg Oral Q6H PRN Howerter, Justin B, DO   650 mg at 04/27/21 1108  ? Or  ? acetaminophen (TYLENOL) suppository 650 mg  650 mg Rectal Q6H PRN Howerter, Justin B, DO      ? [START ON 04/28/2021] ascorbic acid (VITAMIN C) tablet 500 mg  500 mg Oral Daily Imagene Sheller S, DO   500 mg at 04/27/21 1058  ? azithromycin (ZITHROMAX) 500 mg in sodium chloride 0.9 % 250 mL IVPB  500 mg Intravenous Q24H Imagene Sheller S, DO      ? cefTRIAXone (ROCEPHIN) 1 g in sodium chloride 0.9 % 100 mL IVPB  1 g Intravenous Q24H Howerter, Justin B, DO      ? [START ON 04/28/2021] ferrous sulfate tablet 325 mg  325 mg Oral Q breakfast Imagene Sheller S, DO   325 mg at 04/27/21 1058  ? ipratropium-albuterol (DUONEB) 0.5-2.5 (3) MG/3ML nebulizer solution 3 mL  3 mL Nebulization Q6H PRN Kathryne Eriksson, NP   3 mL at 04/27/21 0612  ? [START ON 04/28/2021] levothyroxine (SYNTHROID)  tablet 125 mcg  125 mcg Oral Q0600 Imagene Sheller S, DO      ? memantine [facility]) tablet 5 mg  5 mg Oral BID Howerter, Justin B, DO   5 mg at 04/27/21 1056  ? metoprolol succinate (TOPROL-XL) 24 hr tablet 50 mg  50 mg Oral Daily Howerter, Justin B, DO   50 mg at 04/27/21 1056  ? sertraline (ZOLOFT) tablet 50 mg  50 mg Oral q morning Imagene Sheller S, DO      ? Vitamin D (Ergocalciferol) (DRISDOL) capsule 50,000 Units  50,000 Units Oral Q7 days Leslee Home, DO      ? ? ? ?Discharge Medications: ?Please see discharge summary for a list of discharge medications. ? ?Relevant Imaging Results: ? ?Relevant Lab Results: ? ? ?Additional Information ?SS#[id number] [id number] ? ?Dessa Phi, RN ? ? ? ? ?

## 2021-04-27 NOTE — Progress Notes (Addendum)
Patient in A-fib. EKG confirmed rhythm. Patient asymptomatic. Vitals stable. Floor coverage and admitting MD made aware.  ? ?

## 2021-04-27 NOTE — Assessment & Plan Note (Signed)
? ?#)   Paroxysmal atrial fibrillation: Documented history of such during August 2020 hospitalization, as further detailed above. In setting of CHA2DS2-VASc score of 5, there is an indication for chronic anticoagulation for thromboembolic prophylaxis.  However, patient is on a full dose aspirin on daily basis as an outpatient, in the absence of formal anticoagulation.  Rationale for this option rather than full anticoagulation is not entirely clear to me at this time.  Home AV nodal blocking regimen: Metoprolol succinate.  Most recent echocardiogram occurred in August 2020, as further detailed above. Presenting EKG demonstrates rate controlled atrial fibrillation without evidence of overt acute ischemic changes..  ? ? ?Plan: monitor strict I's & O's and daily weights. Repeat BMP/CBC in AM. Check serum mag level. Continue home AV nodal blocking regimen.  For now, continue daily full dose aspirin.  Additional chart review to ascertain rationale behind the absence of formal anticoagulation.  Monitor on telemetry. ? ? ?

## 2021-04-27 NOTE — Assessment & Plan Note (Signed)
?#)   Acute cystitis: Questionable urinary tract infection, with presenting urinalysis demonstrating hazy appearing specimen, many bacteria, nitrate positive, small leukocyte esterase, no squamous epithelial cells.  However, clinically, less likely to represent true urinary tract infection in the setting of presence of 0-5 white blood cells.  Would otherwise meet criteria for uncomplicated urinary tract action.  Rocephin which is ordered for component of antimicrobial treatment of presenting community-acquired pneumonia, will also provide empiric coverage for any underlying urinary tract infection. ? ?Plan: Rocephin, with primary indication for community-acquired pneumonia, as above.  Add on urine culture.  Repeat CBC differential in the morning. ? ? ? ?

## 2021-04-27 NOTE — Progress Notes (Signed)
Attempted to utilize interpreter to complete required admission documentation. Pt unable to understand/respond to interpreter; interpreter stated she was not responding to his questions and that her statements weren't making sense, she appeared to be confused. Attempted to reorient patient; reassured her and repositioned for comfort.  Coolidge Breeze, RN 04/27/2021 ? ?

## 2021-04-27 NOTE — H&P (Addendum)
?History and Physical  ? ? ?PLEASE NOTE THAT DRAGON DICTATION SOFTWARE WAS USED IN THE CONSTRUCTION OF THIS NOTE. ? ? ?Ann Cole [id number] DOB: 09-25-1940 DOA: 04/26/2021 ? ?PCP: Billie Ruddy, MD  ?Patient coming from: home  ? ?I have personally briefly reviewed patient's old medical records in [place] ? ?Chief Complaint: Concern for pneumonia ? ?HPI: Ann Cole is a 81 y.o. female with medical history significant for dementia, essential pretension, acquired hypothyroidism, paroxysmal atrial fibrillation, chronic diastolic heart failure, chronic anemia with baseline hemoglobin 10-12, who is admitted to [facility] on 04/26/2021 with community-acquired pneumonia after presenting from [place] to [facility] ED for evaluation of concern for pneumonia at the direction of her PCP. ? ?In setting of patient's underlying dementia, the following history provided by staff at [facility], my discussions with the EDP, and via chart review. ? ?The patient reportedly had been experiencing 2 to 3 days of progressive new onset productive cough while complaining of some associated subjective fever, mild shortness of breath in the absence of any chest pain, nausea, vomiting, diarrhea.  Per staff at [facility], patient's report of recent productive cough is confirmed, with staff also conveyed that the patient's exhibited evidence of diminished oral intake over the last few days.  In the context of the above constellation of symptoms, she was evaluated by her PCP who performed outpatient chest x-ray, which was reportedly concerning for pneumonia, prompting PCP to recommend that the patient present to local emergency department for further evaluation and management thereof. ? ?Per chart review, she has a history of chronic diastolic heart failure, with most recent echocardiogram in August 2020 demonstrating LVEF 60 to 65%, no focal motion abnormalities, grade 1 diastolic dysfunction,  normal right ventricular systolic function, and mild aortic stenosis.  Per chart review during a August 2020 hospitalization, there is documentation of evidence of paroxysmal atrial fibrillation during that hospitalization.  She is on a full dose aspirin on a daily basis, but not formally anticoagulated. ? ? ? ? ?ED Course:  ?Vital signs in the ED were notable for the following: Afebrile; heart rate 96-1 10; blood pressure 138/64; respiratory rate 17-26, oxygen saturation 97 to 100% on room air. ? ?Labs were notable for the following: CMP notable for the following: Creatinine 1.20 compared to most recent prior creatinine data point of 1.27 in May 2022, glucose 131, calcium, corrected for mild hypoalbuminemia noted to be 9.2, albumin 3.1, otherwise liver enzymes within normal limits.  Troponin I x1 noted to be 18.  CBC notable for white cell count 9000, hemoglobin 9.1 compared to most recent prior value of 11.4 in May 2020, with presenting hemoglobin associated normocytic/normochromic findings, platelet count 396.  Urinalysis associate with hazy appearing specimen which demonstrated 0-5 white blood cells, many bacteria, no squamous Othella cells nitrate positive, small leukocyte esterase, was positive for hyaline cast.  Blood cultures x2 were collected prior to initiation of IV antibiotics.  COVID-19/influenza PCR negative. ? ?Imaging and additional notable ED work-up: EKG showed atrial fibrillation with heart rate 105, nonspecific T wave flattening in aVL, and no evidence of ST changes, including no evidence of ST elevation.  Chest x-ray showed right upper lobe airspace opacity consistent with pneumonia in the absence of any evidence of edema, pleural effusion, or pneumothorax.  Chest x-ray also incidentally noted subacute on chronic fracture of the right humeral head/neck junction. ? ?While in the ED, the following were administered: Azithromycin, Rocephin, normal saline x1 L  bolus, acetaminophen 650 mg p.o. x1  dose. ? ?Subsequently, the patient was admitted for further evaluation management of community-acquired pneumonia, with questionable acute cystitis, with labs notable for acute on chronic anemia, and imaging notable for incidentally noted right humeral head/neck junction fracture. ? ? ? ?Review of Systems: As per HPI otherwise 10 point review of systems negative.  ? ?Past Medical History:  ?Diagnosis Date  ? Chronic diastolic heart failure ([place])   ? Dementia ([place])   ? Hyperlipidemia   ? Hypertension   ? Paroxysmal atrial fibrillation (HCC)   ? Thyroid disease   ? ? ?History reviewed. No pertinent surgical history. ? ?Social History: ? reports that she has never smoked. She has never used smokeless tobacco. She reports that she does not drink alcohol and does not use drugs. ? ? ?No Known Allergies ? ?Family History  ?Problem Relation Age of Onset  ? Heart disease Mother   ? Mental illness Mother   ? Stroke Father   ? Mental illness Brother   ? Heart disease Daughter   ? Alcohol abuse Son   ? ? ?Family history reviewed and not pertinent  ? ? ?Prior to Admission medications   ?Medication Sig Start Date End Date Taking? Authorizing Provider  ?acetaminophen (TYLENOL) 500 MG tablet Take 500 mg by mouth every 6 (six) hours as needed for mild pain, fever or headache.    [provider]  ?alum & mag hydroxide-simeth (MAALOX/MYLANTA) 200-200-20 MG/5ML suspension Take 30 mLs by mouth every 6 (six) hours as needed for indigestion or heartburn (NOT TO EXCEED 4 DOSES IN 24HRS).    [provider]  ?aspirin EC 325 MG EC tablet Take 1 tablet (325 mg total) by mouth daily. 08/25/18   Bonnielee Haff, MD  ?cephALEXin (KEFLEX) 500 MG capsule Take 1 capsule (500 mg total) by mouth 3 (three) times daily. 07/31/19   Horton, Barbette Hair, MD  ?donepezil (ARICEPT) 10 MG tablet Take 1 tablet (10 mg total) by mouth at bedtime. 11/29/17   Billie Ruddy, MD  ?guaifenesin (ROBITUSSIN) 100 MG/5ML syrup Take 200 mg by mouth every  6 (six) hours as needed for cough.    [provider]  ?levothyroxine (SYNTHROID) 100 MCG tablet Take 100 mcg by mouth daily. 04/14/20   [provider]  ?loperamide (IMODIUM) 2 MG capsule Take 2 mg by mouth as needed for diarrhea or loose stools (DO NOT EXCEED 8 DOSES IN 24HRS).    [provider]  ?magnesium hydroxide (MILK OF MAGNESIA) 400 MG/5ML suspension Take 5 mLs by mouth daily as needed for mild constipation.    [provider]  ?Melatonin 5 MG TABS Take 5 mg by mouth at bedtime.    [provider]  ?memantine (NAMENDA) 5 MG tablet Take 1 tablet (5 mg total) by mouth 2 (two) times daily. 11/29/17   Billie Ruddy, MD  ?metoprolol succinate (TOPROL-XL) 50 MG 24 hr tablet Take 1 tablet (50 mg total) by mouth daily. Take with or immediately following a meal. 08/25/18   Bonnielee Haff, MD  ?neomycin-bacitracin-polymyxin (NEOSPORIN) [phone] ointment Apply 1 application topically 4 (four) times daily.    [provider]  ?Nutritional Supplements (NUTRITIONAL SHAKE PO) Take 1 Can by mouth 3 (three) times daily.    [provider]  ?sertraline (ZOLOFT) 25 MG tablet Take 1 tablet (25 mg total) by mouth daily. 05/18/20   Billie Ruddy, MD  ? ? ? ?Objective  ? ? ?Physical Exam: ?Vitals:  ?  04/26/21 2045 04/26/21 2215 04/26/21 2245 04/27/21 0010  ?BP: (!) 118/51 (!) 162/70 116/61 (!) 142/60  ?Pulse: 98 98 97 89  ?Resp: (!) 26 17  16   ?Temp:    98.4 ?F (36.9 ?C)  ?TempSrc:    Oral  ?SpO2: 100% 97% 97% 97%  ?Weight:    96.8 kg  ? ? ?General: appears to be stated age; alert, confused ?Skin: warm, dry, no rash ?Head:  AT/[place] ?Mouth:  Oral mucosa membranes appear dry, normal dentition ?Neck: supple; trachea midline ?Heart: Irregular; did not appreciate any M/R/G ?Lungs: CTAB, did not appreciate any wheezes, rales, or rhonchi ?Abdomen: + BS; soft, ND, NT ?Vascular: 2+ pedal pulses b/l; 2+ radial pulses b/l ?Extremities: no peripheral edema, no muscle  wasting ?Neuro: strength and sensation intact in upper and lower extremities b/l ? ? ?Labs on Admission: I have personally reviewed following labs and imaging studies ? ?CBC: ?Recent Labs  ?Lab 04/26/21 ?1711  ?WB

## 2021-04-27 NOTE — Assessment & Plan Note (Signed)
? ?#)  Community-acquired pneumonia: Diagnosis on the basis of 2 to 3 days of progressive new onset productive cough, shortness of breath, subjective fever, with chest x-ray showing evidence of right upper lobe airspace opacity consistent with pneumonia.  Of note, in the absence of objective fever or leukocytosis, SIRS criteria not currently met for sepsis.  No evidence of hypotension.  Blood cultures x2 were collected in the ED prior to initiation of azithromycin and Rocephin, which will be continued for community-acquired pneumonia coverage.  Oxygen saturations in the high 90s on room air. ? ?Plan: Monitor for results of blood cultures x2.  Continue azithromycin and Rocephin.  Add on sputum culture.  Repeat CBC with differential in the morning.  Add on procalcitonin level.  Check strep pneumoniae urine antigen.  Flutter valve. ? ? ?

## 2021-04-27 NOTE — TOC Initial Note (Signed)
Transition of Care (TOC) - Initial/Assessment Note  ? ? ?Patient Details  ?Name: Ann Cole ?MRN: [id number] ?Date of Birth: 1940/11/08 ? ?Transition of Care [facility]) CM/SW Contact:    ?Lanier Clam, RN ?Phone Number: ?04/27/2021, 2:57 PM ? ?Clinical Narrative:  From ALF [facility] memory care to return.               ? ? ?Expected Discharge Plan: Assisted Living ?Barriers to Discharge: Continued Medical Work up ? ? ?Patient Goals and CMS Choice ?Patient states their goals for this hospitalization and ongoing recovery are:: ALF ?CMS Medicare.gov Compare Post Acute Care list provided to:: Patient Represenative (must comment) [name] grnd dtr [phone]) ?Choice offered to / list presented to : Adult Children ? ?Expected Discharge Plan and Services ?Expected Discharge Plan: Assisted Living ?  ?Discharge Planning Services: CM Consult ?Post Acute Care Choice:  (ALF memory care) ?Living arrangements for the past 2 months:  (ALF memory care) ?                ?  ?  ?  ?  ?  ?  ?  ?  ?  ?  ? ?Prior Living Arrangements/Services ?Living arrangements for the past 2 months:  (ALF memory care) ?Lives with:: Facility Resident ?Patient language and need for interpreter reviewed:: Yes ?Do you feel safe going back to the place where you live?: Yes      ?Need for Family Participation in Patient Care: Yes (Comment) ?Care giver support system in place?: Yes (comment) ?  ?Criminal Activity/Legal Involvement Pertinent to Current Situation/Hospitalization: No - Comment as needed ? ?Activities of Daily Living ?  ?  ? ?Permission Sought/Granted ?Permission sought to share information with : Case Manager ?Permission granted to share information with : Yes, Verbal Permission Granted ? Share Information with NAME: Case Manager ?   ?   ?   ? ?Emotional Assessment ?Appearance:: Appears stated age ?  ?  ?Orientation: : Oriented to Self ?Alcohol / Substance Use: Not Applicable ?Psych Involvement: No (comment) ? ?Admission diagnosis:   Lower urinary tract infectious disease [N39.0] ?CAP (community acquired pneumonia) [J18.9] ?Community acquired pneumonia, unspecified laterality [J18.9] ?Patient Active Problem List  ? Diagnosis Date Noted  ? Acute cystitis 04/27/2021  ? Acute on chronic anemia 04/27/2021  ? Humeral head fracture, right, closed, initial encounter 04/27/2021  ? Thyroid disease   ? Paroxysmal atrial fibrillation (HCC)   ? Chronic diastolic heart failure (HCC)   ? CAP (community acquired pneumonia) 04/26/2021  ? Syncope 08/23/2018  ? COVID-19 virus infection 08/23/2018  ? Dementia (HCC) 01/05/2018  ? Essential hypertension 01/05/2018  ? ?PCP:  Deeann Saint, MD ?Pharmacy:  No Pharmacies Listed ? ? ? ?Social Determinants of Health (SDOH) Interventions ?  ? ?Readmission Risk Interventions ?   ? View : No data to display.  ?  ?  ?  ? ? ? ?

## 2021-04-27 NOTE — Assessment & Plan Note (Signed)
? ? ?#)   Chronic diastolic heart failure: documented history of such, with most recent echocardiogram performed in August 2020 notable for LVEF 60 to 65%, grade 1 diastolic dysfunction.. No clinical evidence to suggest acutely decompensated heart failure at this time. home diuretic regimen reportedly consists of the following: None.  Clinically, patient appears to be dehydrated evidence of dry oral mucosa membranes as well as the presence of hyaline casts on urinalysis.  This is in the context of reported recent decline in oral intake as conveyed by [employer]. ? ?Plan: monitor strict I's & O's and daily weights. Repeat BMP in AM. Check serum mag level. ? ?

## 2021-04-27 NOTE — Assessment & Plan Note (Signed)
? ?#)   Acute on chronic anemia: In the context of documented history of chronic anemia with associated baseline hemoglobin 10-12, presenting hemoglobin found to be 9.1, comparison to most recent prior value of 11.4 in May 2022.  It is possible that the nearly 1 year hiatus in hemoglobin data points, baseline associated with her chronic anemia has changed slightly and that her presenting hemoglobin of 9.1 is consistent with this new baseline.  However, for now pursue further evaluation under the assumption of an element of acute exacerbation of chronic anemia in the absence of any evidence of overt bleed.  Presenting hemoglobin associated with normocytic/normochromic findings.  She is on a full dose aspirin on a daily basis, but otherwise on no blood thinners as an outpatient. ? ?Plan: Add on iron studies, MMA, folic acid level, reticulocyte count.  Check INR.  Repeat CBC in the morning. ? ?

## 2021-04-27 NOTE — Assessment & Plan Note (Signed)
? ? ?#)   Essential hypertension: Documented history of such, on metoprolol succinate as an outpatient. ? ?Plan: Continue home beta-blocker.  Close monitoring of ensuing blood pressures via routine vital signs. ? ? ?

## 2021-04-27 NOTE — Progress Notes (Signed)
?  Progress Note ? ? ?Patient: Ann Cole WNI:[id number] DOB: [birth year] DOA: 04/26/2021     1 ?DOS: the patient was seen and examined on 04/27/2021 ?  ?Brief hospital course: ?81 y.o. female with medical history significant for Alzheimer's dementia, essential hypertension, acquired hypothyroidism, paroxysmal atrial fibrillation, chronic diastolic heart failure, chronic anemia with baseline hemoglobin 10-12, who is admitted to [facility] on 04/26/2021 with community-acquired pneumonia after presenting from [facility] to [facility] ED for evaluation of concern for pneumonia at the direction of her PCP. ? ?Assessment and Plan: ?CAP: right upper lobe opacity on chest film. Continue ceftriaxone and Zithromax. Not requiring any oxygen. Can likely be discharged tomorrow on oral antibiotics.  Strep urine antigen negative.  COVID-19, influenza A/B PCR negative. ? ?Right humeral head fracture: incidental finding on imaging. Discussed with Dr. Ave Filter and he reviewed the films. Appears to be chronic and well-healed. Patient states she broke her arm about 1 year ago. Denies any pain currently.  ? ?Vitamin D def: VitD3 level 15. Start D3 50k weekly. Repeat levels in 4-8 weeks outpatient w/ PCP ? ?Acute on chronic anemia: Baseline Hgb 10-12. Hgb decreased to 8 today. Likely hemodilutional. No evidence of gastrointestinal bleed. Iron studies show iron deficiency anemia.  Give IV iron today.  Start oral iron supplementation with vitamin C tomorrow. ? ?Paroxysmal atrial fibrillation: Continue home Toprol-XL.  Continue home aspirin 325 mg daily.  Unclear why she is not on chronic anticoagulation. CHA2DS2-VASc score of 5 ? ?Essential hypertension: Continue home Toprol-XL ? ?Hypothyroidism: Continue home Synthroid ? ?Alzheimer's dementia: Continue home Namenda and Aricept ? ?Depression: Continue home Zoloft ? ?Chronic diastolic heart failure: Not in acute decompensation.  Euvolemic.  Continue home Toprol-XL ? ?Subjective:  The patient is Spanish-speaking.  Using Engineer, structural.  She states her productive cough is better.  Not requiring any oxygen.  No fevers or chills.  No chest pain.  Asking when she can go home. ? ?Physical Exam: ?Vitals:  ? 04/27/21 0420 04/27/21 0612 04/27/21 0756 04/27/21 1210  ?BP:   (!) 161/97 (!) 152/75  ?Pulse:   (!) 109 (!) 121  ?Resp:   20 20  ?Temp:   97.9 ?F (36.6 ?C) 98.2 ?F (36.8 ?C)  ?TempSrc:   Oral Oral  ?SpO2:  96% 96% 98%  ?Weight: 96.8 kg     ? ?General: appears to be stated age; alert awake ?Skin: warm, dry, no rash ?Head:  AT/[place] ?Mouth:  Oral mucosa membranes moist, normal dentition ?Neck: supple; trachea midline ?Heart: Irregular; did not appreciate any M/R/G ?Lungs: CTAB, did not appreciate any wheezes, rales, or rhonchi ?Abdomen: + BS; soft, ND, NT ?Vascular: 2+ pedal pulses b/l; 2+ radial pulses b/l ?Extremities: no peripheral edema, no muscle wasting ?Neuro: strength and sensation intact in upper and lower extremities b/l ? ?Data Reviewed: ?Labs, imaging, vital signs, etc ? ?Family Communication: No family present at bedside ? ?Disposition: ?Status is: Inpatient ?Remains inpatient appropriate because: requiring IV antibiotics ?Planned Discharge Destination:  [facility] (ALF) ? ? ?Time spent: 40 minutes ? ?Author: ?Verdia Kuba, DO ?04/27/2021 1:06 PM ? ?For on call review [email or web address].  ?

## 2021-04-27 NOTE — Assessment & Plan Note (Signed)
  #)   acquired hypothyroidism: documented h/o such, on Synthroid as outpatient.   Plan: cont home Synthroid.     

## 2021-04-27 NOTE — Assessment & Plan Note (Signed)
? ?#)   Incidentally noted right humeral head/neck junction fracture: Incidental finding on presenting chest x-ray, as further detailed above.  Patient not complaining of any right upper extremity discomfort at this time.  Unclear mechanism of injury.  Will focus on pain control overnight, as further detailed below. ? ? ?Plan: Prn acetaminophen.  Check 25-hydroxy vitamin D level. ? ? ?

## 2021-04-28 DIAGNOSIS — J189 Pneumonia, unspecified organism: Secondary | ICD-10-CM | POA: Diagnosis not present

## 2021-04-28 LAB — CBC
HCT: 30.6 % — ABNORMAL LOW (ref 36.0–46.0)
Hemoglobin: 9.9 g/dL — ABNORMAL LOW (ref 12.0–15.0)
MCH: 26.5 pg (ref 26.0–34.0)
MCHC: 32.4 g/dL (ref 30.0–36.0)
MCV: 81.8 fL (ref 80.0–100.0)
Platelets: 377 10*3/uL (ref 150–400)
RBC: 3.74 MIL/uL — ABNORMAL LOW (ref 3.87–5.11)
RDW: 16.2 % — ABNORMAL HIGH (ref 11.5–15.5)
WBC: 11 10*3/uL — ABNORMAL HIGH (ref 4.0–10.5)
nRBC: 0 % (ref 0.0–0.2)

## 2021-04-28 MED ORDER — AZITHROMYCIN 500 MG PO TABS: 500.0000 mg | ORAL_TABLET | Freq: Every day | ORAL | 0 refills | Status: AC

## 2021-04-28 MED ORDER — VITAMIN D (ERGOCALCIFEROL) 1.25 MG (50000 UNIT) PO CAPS: 50000.0000 [IU] | ORAL_CAPSULE | ORAL | 0 refills | Status: AC

## 2021-04-28 MED ORDER — METOPROLOL SUCCINATE ER 25 MG PO TB24: 75.0000 mg | ORAL_TABLET | Freq: Every day | ORAL | 0 refills | Status: DC

## 2021-04-28 MED ORDER — AZITHROMYCIN 500 MG PO TABS: 500.0000 mg | ORAL_TABLET | Freq: Every day | ORAL | 0 refills | Status: DC

## 2021-04-28 MED ORDER — IPRATROPIUM-ALBUTEROL 0.5-2.5 (3) MG/3ML IN SOLN: 3.0000 mL | Freq: Two times a day (BID) | RESPIRATORY_TRACT | Status: DC

## 2021-04-28 MED ORDER — CEFDINIR 300 MG PO CAPS: 300.0000 mg | ORAL_CAPSULE | Freq: Two times a day (BID) | ORAL | 0 refills | Status: AC

## 2021-04-28 MED ORDER — FERROUS SULFATE 325 (65 FE) MG PO TABS: 325.0000 mg | ORAL_TABLET | Freq: Every day | ORAL | 0 refills | Status: AC

## 2021-04-28 MED ORDER — APIXABAN 5 MG PO TABS: 5.0000 mg | ORAL_TABLET | Freq: Two times a day (BID) | ORAL | 0 refills | Status: DC

## 2021-04-28 MED ORDER — METOPROLOL SUCCINATE ER 50 MG PO TB24
75.0000 mg | ORAL_TABLET | Freq: Every day | ORAL | Status: DC
  Administered 2021-04-28: 75 mg via ORAL
  Filled 2021-04-28: qty 1

## 2021-04-28 MED ORDER — ASCORBIC ACID 250 MG PO TABS
250.0000 mg | ORAL_TABLET | Freq: Every day | ORAL | 0 refills | Status: AC
Start: 2021-04-29 — End: 2021-05-29

## 2021-04-28 NOTE — Discharge Instructions (Signed)
1) Follow up with PCP in 1 week ?2) Take vitamin D3 as prescribed ?3) Repeat vitamin D3 level in 8 weeks ?4) Repeat iron studies in 8 weeks ?5) Take iron and vitamin C as prescribed ?6) Take omnicef and zithromax as prescribed ?7) Take eliquis as prescribed ?8) Your Toprol XL dose was increased ?

## 2021-04-28 NOTE — Plan of Care (Signed)

## 2021-04-28 NOTE — Progress Notes (Signed)
Pt's granddaughter, [name], called and given update on patient's condition and plan of care. Verbalized understanding. ?

## 2021-04-28 NOTE — Progress Notes (Signed)
Pt discharging back to [facility]F, memory care. Report given to [facility], receiving nurse. Verbalized understanding. PTAR called and arranged. [name], granddaughter, called and made aware of plan of care. ?

## 2021-04-28 NOTE — TOC Transition Note (Signed)
Transition of Care (TOC) - CM/SW Discharge Note ? ? ?Patient Details  ?Name: Ann Cole ?MRN: [id number] ?Date of Birth: 1940/12/30 ? ?Transition of Care [facility]) CM/SW Contact:  ?Dessa Phi, RN ?Phone Number: ?04/28/2021, 11:34 AM ? ? ?Clinical Narrative: Please send hard script for any abx, & new med. Awaiting d/c summary. Returning to [facility] memory care  to rm#[id number], nsg report tel#[phone] prior PTAR. No further CM needs.   ? ? ? ?Final next level of care: Assisted Living ?Barriers to Discharge: No Barriers Identified ? ? ?Patient Goals and CMS Choice ?Patient states their goals for this hospitalization and ongoing recovery are:: ALF ?CMS Medicare.gov Compare Post Acute Care list provided to:: Patient Represenative (must comment) Lisabeth Devoid grnd dtr [phone]) ?Choice offered to / list presented to : Adult Children ? ?Discharge Placement ?  ?           ?Patient chooses bed at: Other - please specify in the comment section below: ([facility]-ALF) ?Patient to be transferred to facility by: PTAR ?Name of family member notified: Lisabeth Devoid grand dtr [phone] ?Patient and family notified of of transfer: 04/28/21 ? ?Discharge Plan and Services ?  ?Discharge Planning Services: CM Consult ?Post Acute Care Choice:  (ALF memory care)          ?  ?  ?  ?  ?  ?  ?  ?  ?  ?  ? ?Social Determinants of Health (SDOH) Interventions ?  ? ? ?Readmission Risk Interventions ?   ? View : No data to display.  ?  ?  ?  ? ? ? ? ? ?

## 2021-04-28 NOTE — Discharge Summary (Addendum)
?Physician Discharge Summary ?  ?Patient: Ann Cole MRN: [id number] DOB: August 02, 1940  ?Admit date:     04/26/2021  ?Discharge date: 04/28/21  ?Discharge Physician: Verdia Kuba  ? ?PCP: Deeann Saint, MD  ? ?Recommendations at discharge:  ?1) Follow up with PCP in 1 week ?2) Take vitamin D3 as prescribed ?3) Repeat vitamin D3 level in 8 weeks ?4) Repeat iron studies in 8 weeks ?5) Take iron and vitamin C as prescribed ?6) Take omnicef and zithromax as prescribed ?7) Your Toprol XL dose was increased ? ?Discharge Diagnoses: ?Community-acquired pneumonia ?Acute cystitis, questionable, ruled out ?Vitamin D deficiency ?Acute on chronic anemia, iron deficiency anemia ?Paroxysmal atrial fibrillation ?Essential hypertension ?Hypothyroidism ?Alzheimer's dementia ?Depression ?Chronic diastolic heart failure, not in acute decompensation ? ?Hospital Course: ?81 y.o. female with medical history significant for Alzheimer's dementia, essential hypertension, acquired hypothyroidism, paroxysmal atrial fibrillation, chronic diastolic heart failure, chronic anemia with baseline hemoglobin 10-12, who is admitted to [facility] on 04/26/2021 with community-acquired pneumonia after presenting from [facility] to [facility] ED for evaluation of concern for pneumonia at the direction of her PCP.  She was admitted for community-acquired pneumonia.  Right upper lobe opacity on chest film.  Started on IV ceftriaxone and IV Zithromax.  Did not require any oxygen supplementation during hospitalization.  Strep urine antigen was negative.  COVID-19, influenza a and B PCR were negative.  She will be discharged on Omnicef and Zithromax.  Of note she did have incidental finding of a right femoral head/neck junction fracture.  Discussed with Dr. Ave Filter and he reviewed the films and these appear to be chronic and well-healed.  The patient reported no pain.  Vitamin D levels were checked and were low.  She has been started on  vitamin D supplementation weekly.  Repeat vitamin D level in 8 weeks by PCP.  Also was noted to be anemic with no evidence of gastrointestinal bleed.  Definitely had a component of hemodilution given IV fluids in the emergency department initially.  Iron studies showed iron deficiency anemia and was given 1 dose of IV iron and started on oral iron supplementation.  Repeat iron studies in 8 weeks by PCP. ?She has a documented history of paroxysmal atrial fibrillation.  Does have a CHA2DS2-VASc score of 5 but previously there have been discussions with her granddaughter regarding anticoagulation and she elected not to pursue anticoagulation in the setting of her Alzheimer's dementia.  Therefore put on full dose aspirin.  Does not appear to be on this any longer.  My recommendation is to resume full dose aspirin again on discharge. ? ?Admitting Diagnosis: ?Community-acquired pneumonia ?Acute cystitis, questionable urinary tract infection ?Acute on chronic anemia ?Incidentally noted a right humeral head/neck junction fracture ?Essential hypertension ?Acquired hypothyroidism ?Paroxysmal atrial fibrillation ?Chronic diastolic heart failure ? ? ? ?Consultants: None ?Procedures performed: None ?Disposition: Assisted living ?Diet recommendation:  ?Discharge Diet Orders (From admission, onward)  ? ?  Start     Ordered  ? 04/28/21 0000  Diet - low sodium heart healthy       ? 04/28/21 1148  ? ?  ?  ? ?  ? ?Cardiac diet ?DISCHARGE MEDICATION: ?Allergies as of 04/28/2021   ?No Known Allergies ?  ? ?  ?Medication List  ?  ? ?STOP taking these medications   ? ?donepezil 10 MG tablet ?Commonly known as: ARICEPT ?  ? ?  ? ?TAKE these medications   ? ?acetaminophen 500 MG tablet ?Commonly known as:  TYLENOL ?Take 500 mg by mouth See admin instructions. Take one tablet (500 mg) by mouth every 8 hours, scheduled - may also take one tablet (500 mg) every 6 hours as needed for headache, minor discomfort, fever 99.5-101 F ?  ?alum & mag  hydroxide-simeth 200-200-20 MG/5ML suspension ?Commonly known as: MAALOX/MYLANTA ?Take 30 mLs by mouth every 6 (six) hours as needed for indigestion or heartburn (NOT TO EXCEED 4 DOSES IN 24HRS). ?  ?ascorbic acid 250 MG tablet ?Commonly known as: VITAMIN C ?Take 1 tablet (250 mg total) by mouth daily. ?Start taking on: April 29, 2021 ?  ?aspirin 325 MG EC tablet ?Take 1 tablet (325 mg total) by mouth daily. ?  ?azithromycin 500 MG tablet ?Commonly known as: Zithromax ?Take 1 tablet (500 mg total) by mouth daily for 2 days. Take 1 tablet daily for 2 days. ?  ?cefdinir 300 MG capsule ?Commonly known as: OMNICEF ?Take 1 capsule (300 mg total) by mouth 2 (two) times daily for 4 days. ?  ?ferrous sulfate 325 (65 FE) MG tablet ?Take 1 tablet (325 mg total) by mouth daily with breakfast. ?Start taking on: April 29, 2021 ?  ?guaifenesin 100 MG/5ML syrup ?Commonly known as: ROBITUSSIN ?Take 200 mg by mouth every 6 (six) hours as needed for cough. ?  ?levothyroxine 25 MCG tablet ?Commonly known as: SYNTHROID ?Take 25 mcg by mouth See admin instructions. Take one tablet (25 mcg) by mouth on Mondays before breakfast (with a 100 mcg tablet for a total dose of 125 mcg). Take 100 mcg on all other days of the week ?  ?levothyroxine 100 MCG tablet ?Commonly known as: SYNTHROID ?Take 100 mcg by mouth See admin instructions. Take one tablet (100 mcg) by mouth daily before breakfast (on Mondays also take 25 mcg tablet) ?  ?loperamide 2 MG capsule ?Commonly known as: IMODIUM ?Take 2 mg by mouth as needed for diarrhea or loose stools (DO NOT EXCEED 8 DOSES IN 24HRS). ?  ?magnesium hydroxide 400 MG/5ML suspension ?Commonly known as: MILK OF MAGNESIA ?Take 30 mLs by mouth at bedtime as needed (constipation). ?  ?melatonin 5 MG Tabs ?Take 5 mg by mouth at bedtime. ?  ?memantine 5 MG tablet ?Commonly known as: NAMENDA ?Take 1 tablet (5 mg total) by mouth 2 (two) times daily. ?  ?metoprolol succinate 25 MG 24 hr tablet ?Commonly known as:  TOPROL-XL ?Take 3 tablets (75 mg total) by mouth daily. Take with or immediately following a meal. ?Start taking on: April 29, 2021 ?What changed:  ?medication strength ?how much to take ?  ?sertraline 50 MG tablet ?Commonly known as: ZOLOFT ?Take 50 mg by mouth every morning. ?  ?Vitamin D (Ergocalciferol) 1.25 MG (50000 UNIT) Caps capsule ?Commonly known as: DRISDOL ?Take 1 capsule (50,000 Units total) by mouth every 7 (seven) days. ?Start taking on: May 04, 2021 ?  ? ?  ? ? Contact information for follow-up providers   ? ? Deeann Saint, MD. Call in 1 week(s).   ?Specialty: Family Medicine ?Contact information: ?[address] ?[place] [address] ?[phone] ? ? ?  ?  ? ?  ?  ? ? Contact information for after-discharge care   ? ? Destination   ? ? [facility] .   ?Service: Assisted Living ?Contact information: ?[address] ?[place] [address] ?[phone] ? ?  ?  ? ?  ?  ? ?  ?  ? ?  ? ?Discharge Exam: ?Filed Weights  ?  04/27/21 0010 04/27/21 0420 04/28/21 0500  ?Weight: 96.8 kg 96.8 kg 97.2 kg  ? ?General: appears to be stated age; alert awake ?Skin: warm, dry, no rash ?Head:  AT/[place] ?Mouth:  Oral mucosa membranes moist, normal dentition ?Neck: supple; trachea midline ?Heart: Irregular; did not appreciate any M/R/G ?Lungs: Mild expiratory rhonchi ?Abdomen: + BS; soft, ND, NT ?Vascular: 2+ pedal pulses b/l; 2+ radial pulses b/l ?Extremities: no peripheral edema, no muscle wasting ?Neuro: strength and sensation intact in upper and lower extremities b/l ? ?Condition at discharge: improving ? ?The results of significant diagnostics from this hospitalization (including imaging, microbiology, ancillary and laboratory) are listed below for reference.  ? ?Imaging Studies: ?DG Chest Portable 1 View ? ?Result Date: 04/26/2021 ?CLINICAL DATA:  Shortness of breath. New diagnosis of pneumonia found on outside chest radiograph today. EXAM: PORTABLE CHEST 1 VIEW COMPARISON:  AP  chest 08/23/2018 FINDINGS: Cardiac silhouette is again mildly to moderately enlarged. Mild calcification within aortic arch. Heterogeneous airspace opacity within the lateral right midlung quadrant likely at the i

## 2021-04-29 ENCOUNTER — Telehealth: Payer: Self-pay

## 2021-04-29 LAB — METHYLMALONIC ACID, SERUM: Methylmalonic Acid, Quantitative: 146 nmol/L (ref 0–378)

## 2021-04-29 LAB — URINE CULTURE: Culture: >=100000 — AB

## 2021-04-29 NOTE — Telephone Encounter (Addendum)
Transition Care Management Unsuccessful Follow-up Telephone Call ? ?Date of discharge and from where:  TCM DC [facility] 04-28-21 Dx: Community acquired pneumonia ? ?Attempts:  1st Attempt ? ?Reason for unsuccessful TCM follow-up call:  Unable to leave message ? ?Per grand-daughter pt is at ALF and has PCP there - no tcm needed  ? ?  ?

## 2021-04-30 IMAGING — CT CT ANGIOGRAPHY CHEST
2 of 7 series · 18 of 46 positions shown · IV contrast (omnipaque)
Comparison: Radiograph August 23, 2018.

CLINICAL DATA: Covid positive, shortness of breath suspect PE

EXAM:
CT ANGIOGRAPHY CHEST WITH CONTRAST
TECHNIQUE: Multidetector CT imaging of the chest was performed using the
standard protocol during bolus administration of intravenous
contrast. Multiplanar CT image reconstructions and MIPs were
obtained to evaluate the vascular anatomy.
CONTRAST:  75mL OMNIPAQUE IOHEXOL 350 MG/ML SOLN

[Series 6: thins · axial · 0.80mm/px · z∈[-180,+104]mm · 15 of 318 slices shown]
[im 17/318  lung]
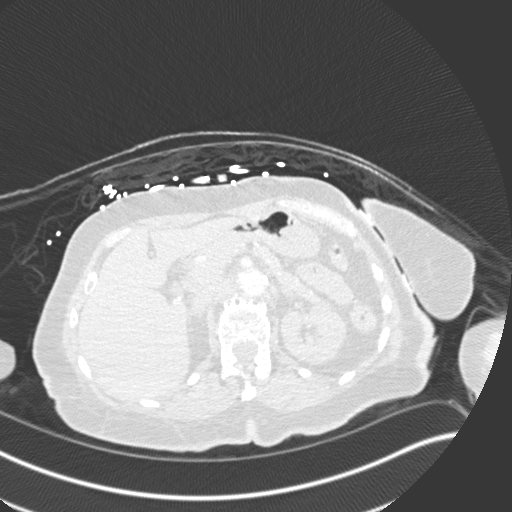
[im 34/318  soft-tissue]
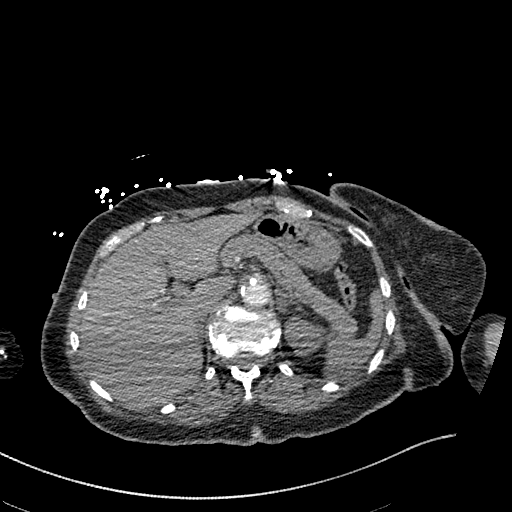
[im 67/318  lung]
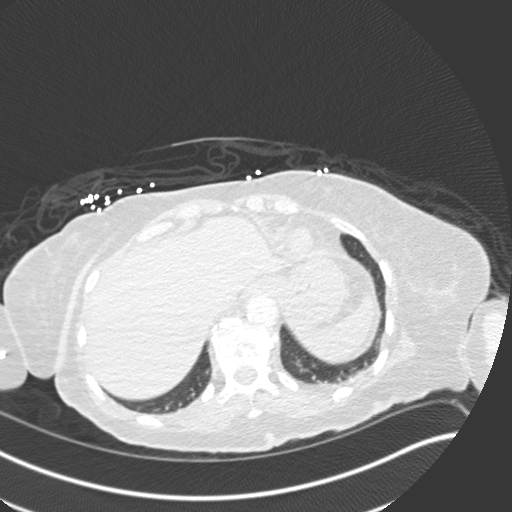
[im 84/318  soft-tissue]
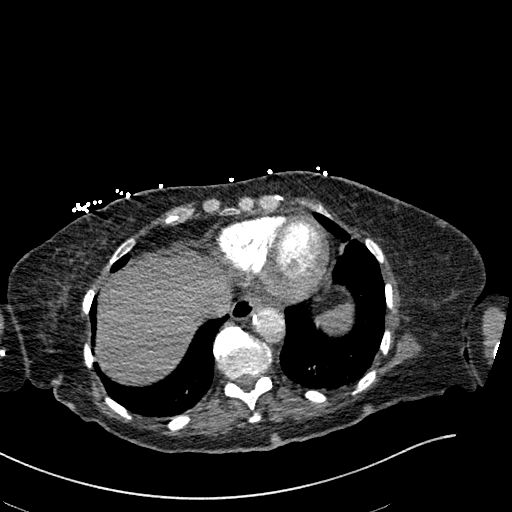
[im 101/318  lung]
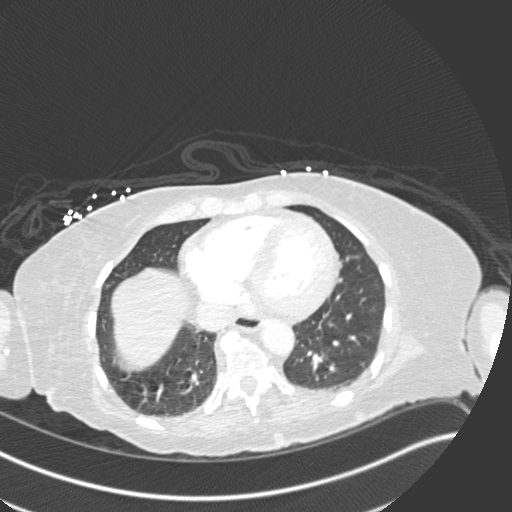
[im 117/318  soft-tissue]
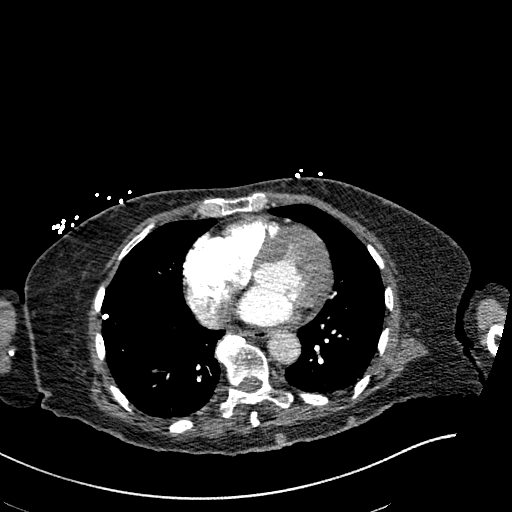
[im 134/318  lung]
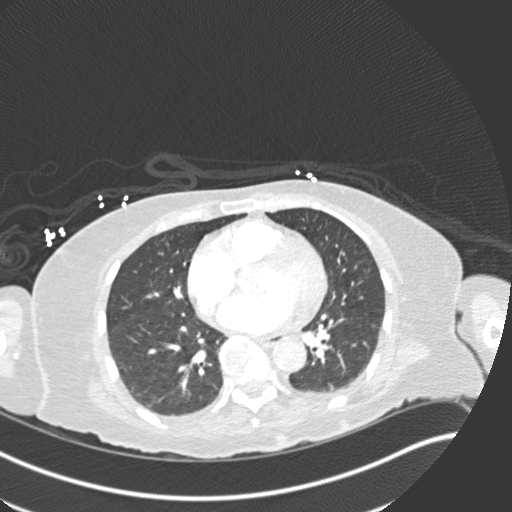
[im 167/318  soft-tissue]
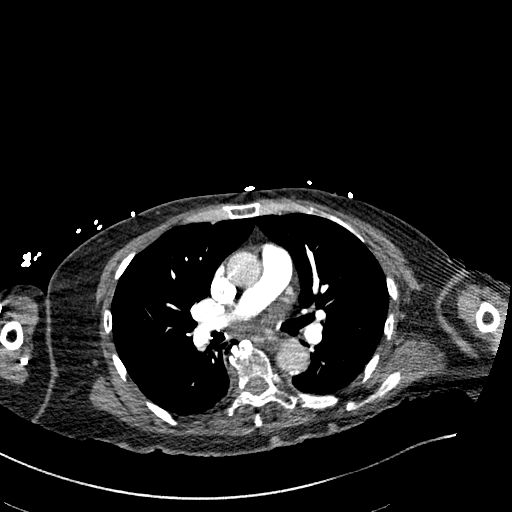
[im 184/318  lung]
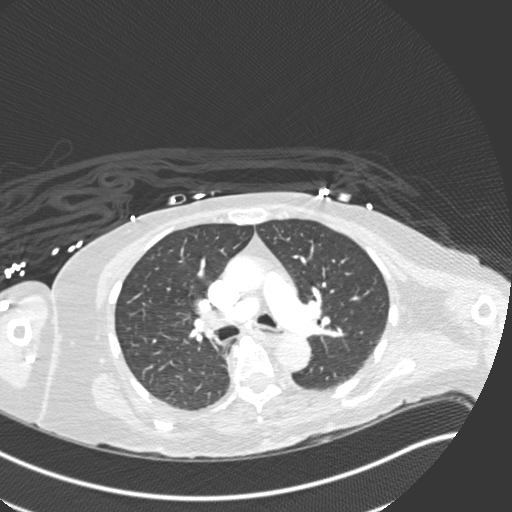
[im 201/318  soft-tissue]
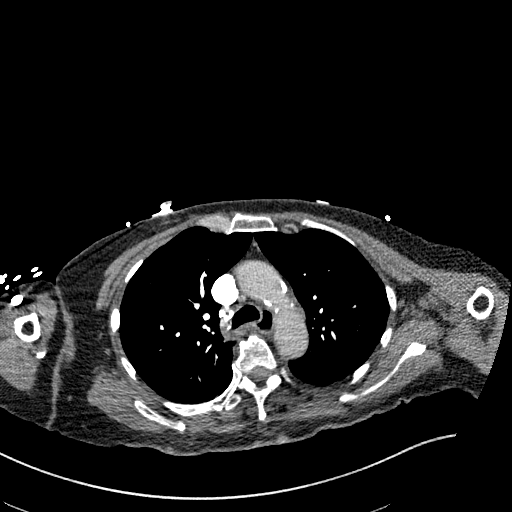
[im 217/318  lung]
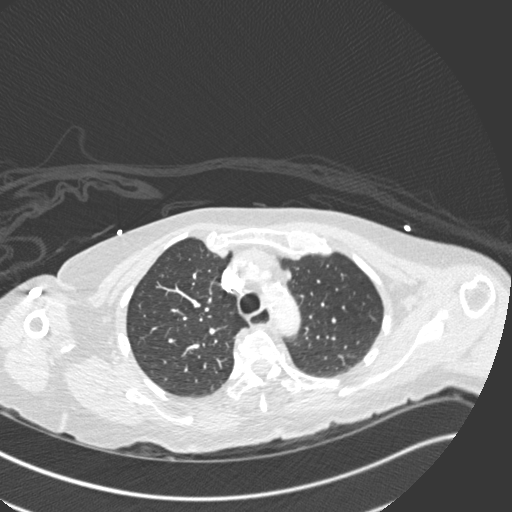
[im 234/318  soft-tissue]
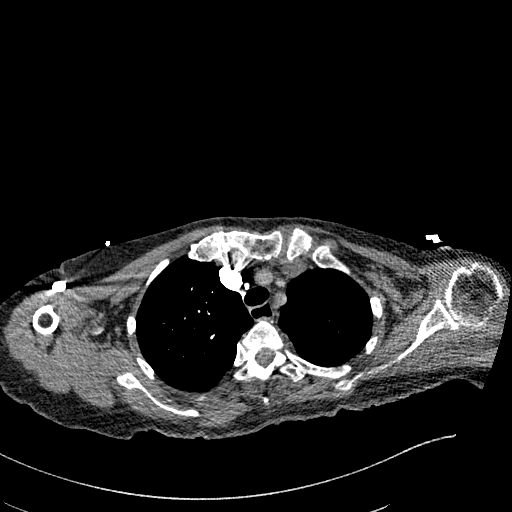
[im 267/318  lung]
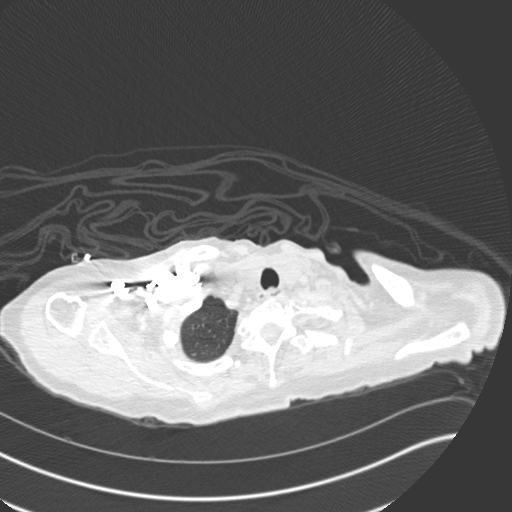
[im 284/318  soft-tissue]
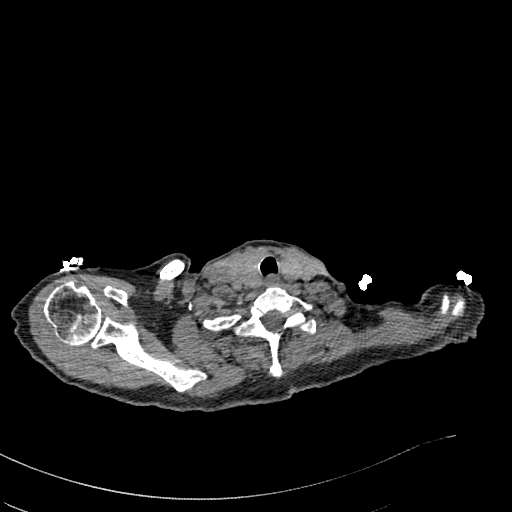
[im 301/318  lung]
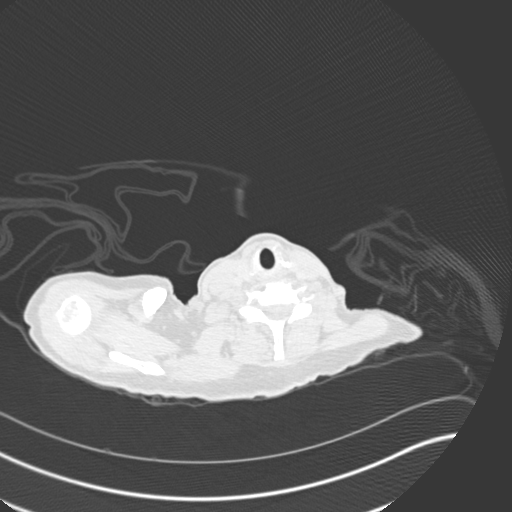

[Series 8: coronal mpr · coronal · 0.72mm/px · 3 of 143 slices shown]
[im 36/143  soft-tissue]
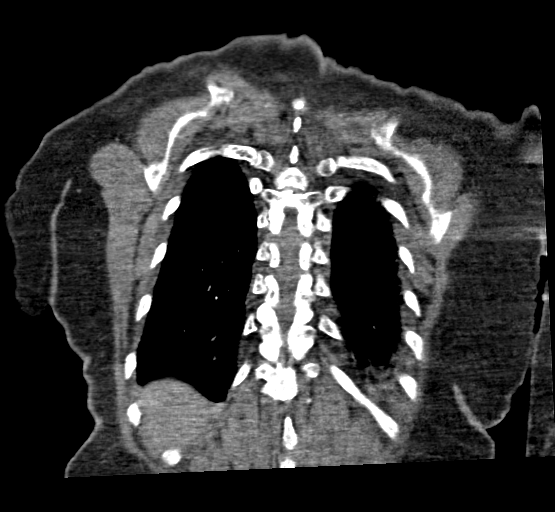
[im 72/143  soft-tissue]
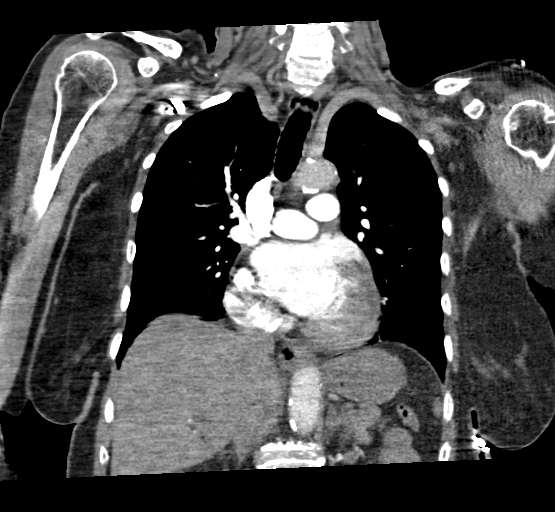
[im 107/143  soft-tissue]
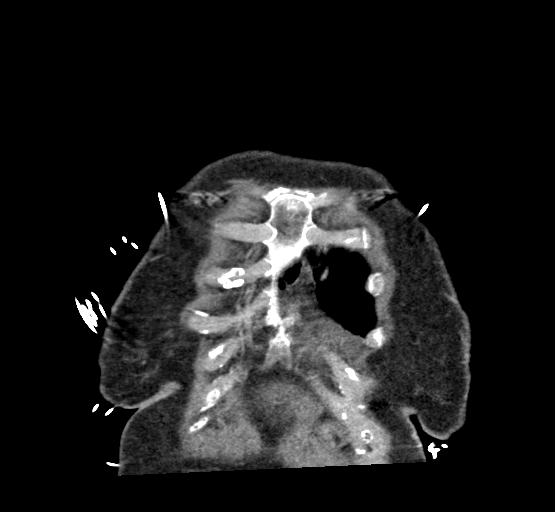

[18 of 46 positions shown; findings below may reference images not displayed]

FINDINGS: Cardiovascular: There is a optimal opacification of the pulmonary
arteries. There is no central,segmental, or subsegmental filling
defects within the pulmonary arteries. There is mild cardiomegaly.
No pericardial effusion is seen. No evidence right heart strain.
There is normal three-vessel brachiocephalic anatomy without
proximal stenosis. Scattered aortic atherosclerotic calcifications
are seen without aneurysmal dilatation.

Mediastinum/Nodes: Small calcified subcarinal and bilateral hilar
lymph nodes are seen, which could be from prior granulomatous
disease. Thyroid gland, trachea, and esophagus demonstrate no
significant findings.

Lungs/Pleura: Calcified granuloma seen at the right lung base. No
focal airspace consolidation. No pleural effusion or pneumothorax.

Upper Abdomen: No acute abnormalities present in the visualized
portions of the upper abdomen. Atherosclerotic calcification within
the aorta.

Musculoskeletal: No chest wall abnormality. No acute or significant
osseous findings. Degenerative changes are seen throughout the
thoracic spine. There is diffuse osteopenia.

Review of the MIP images confirms the above findings.
IMPRESSION: 1. No central, segmental, or subsegmental pulmonary embolism.
2. No acute intrathoracic process to explain the patient's symptoms.

## 2021-04-30 IMAGING — DX DG HIP (WITH OR WITHOUT PELVIS) 2-3V LEFT
2 series · 3 of 3 positions shown · non-contrast
Comparison: None.

CLINICAL DATA: Unwitnessed fall with left hip pain

EXAM:
DG HIP (WITH OR WITHOUT PELVIS) 2-3V LEFT

[pelvis]
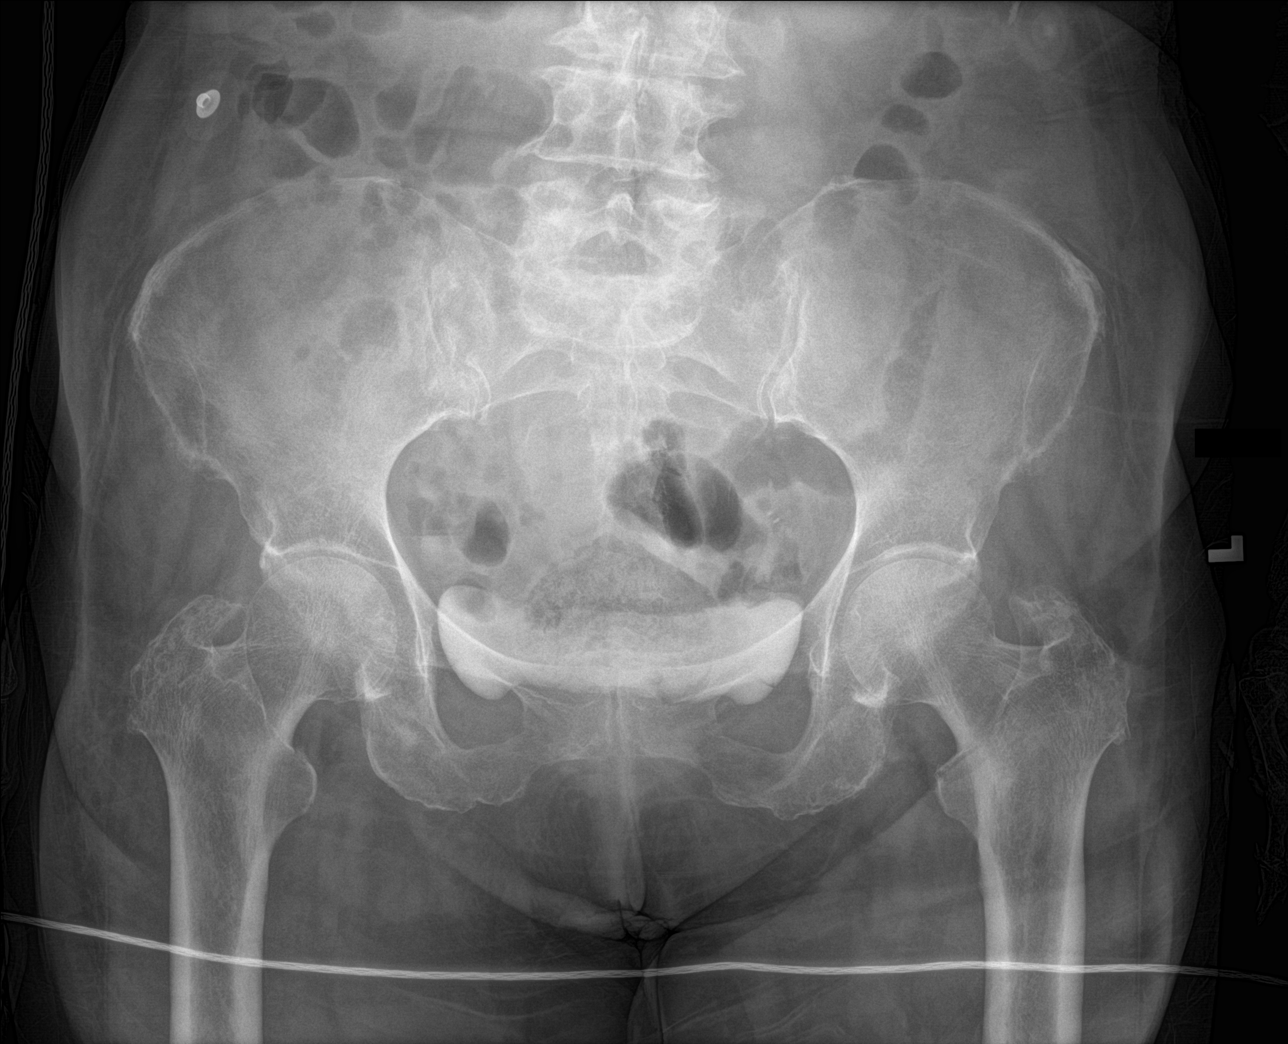

[Series 2: hip · 0.14mm/px · 2 of 2 slices shown]
[im 1/2]
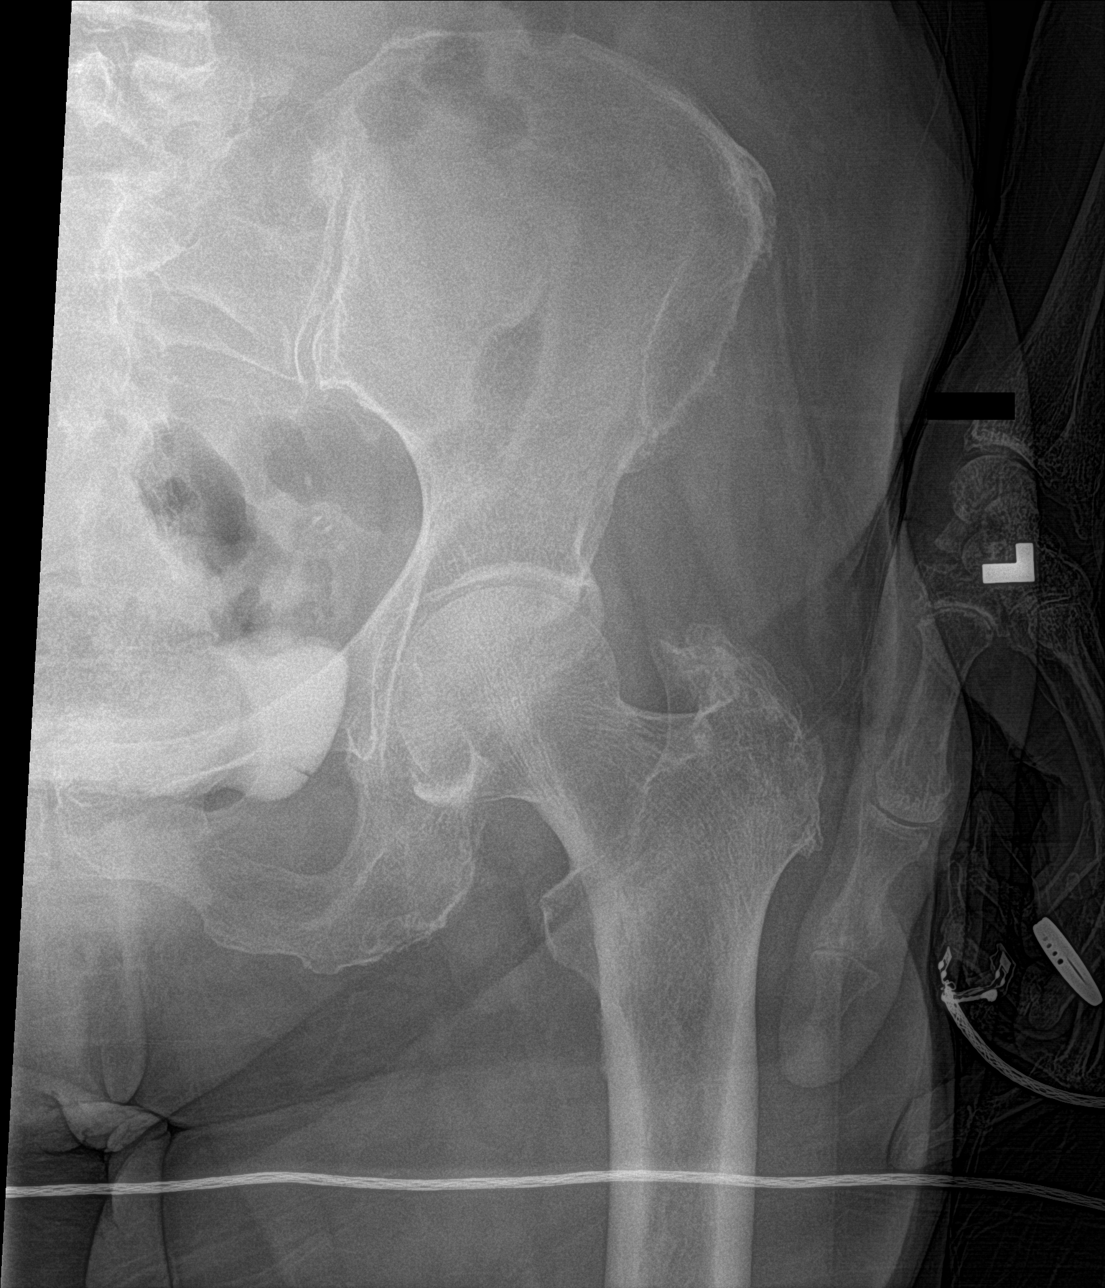
[im 2/2]
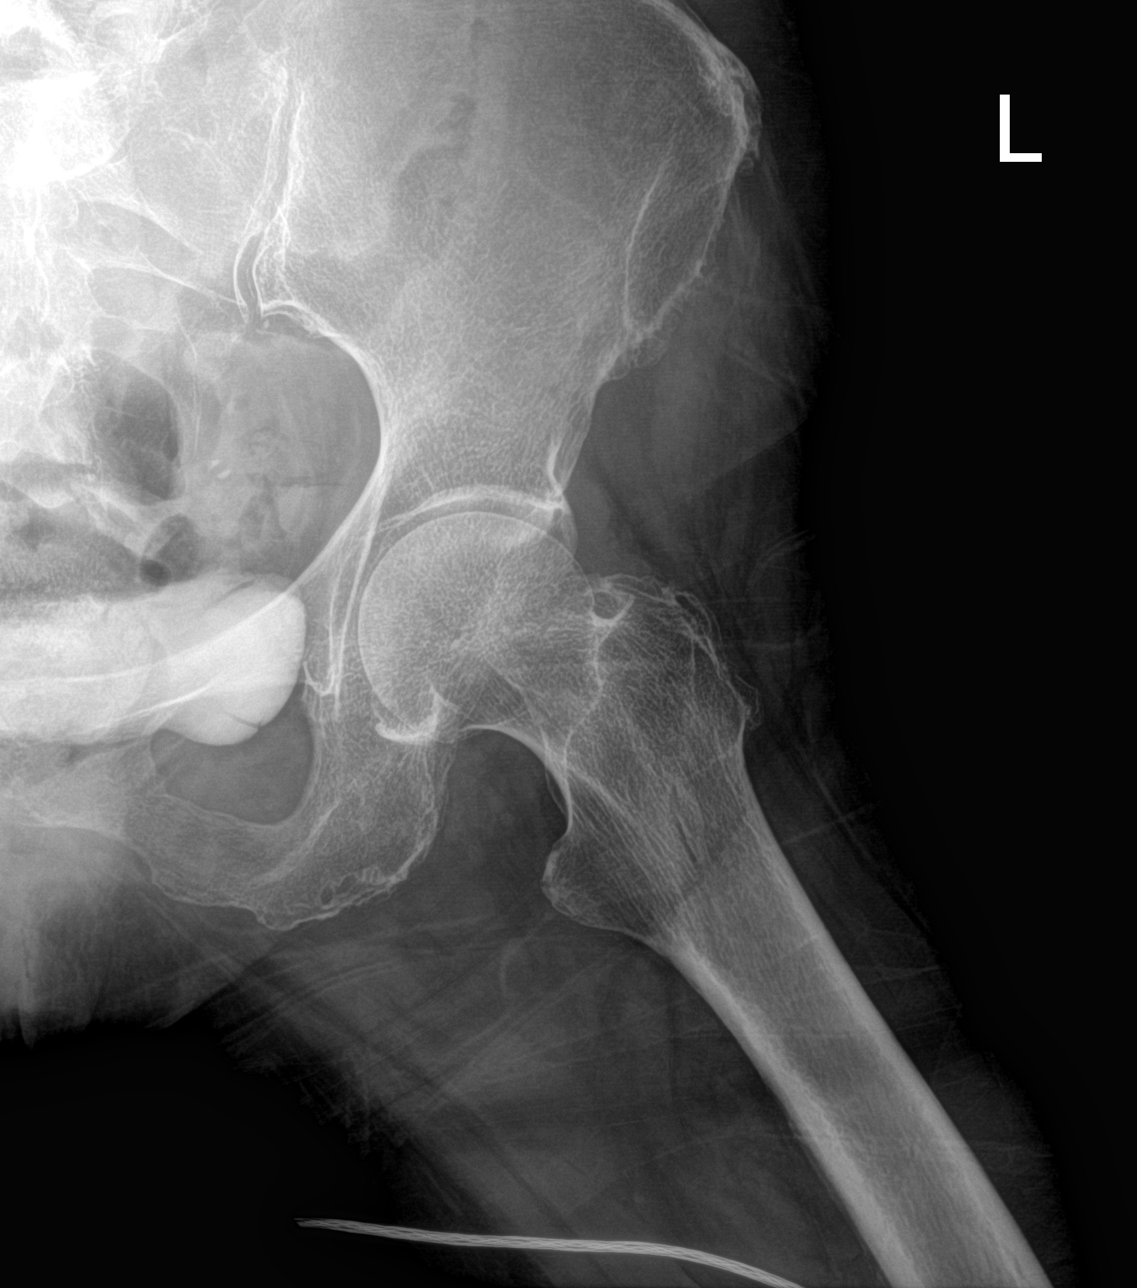

[3 of 3 positions shown; findings below may reference images not displayed]

FINDINGS: Contrast material within the urinary bladder. Pubic symphysis and
rami appear intact. No acute displaced fracture or malalignment.
IMPRESSION: No acute osseous abnormality.

## 2021-05-01 LAB — CULTURE, BLOOD (ROUTINE X 2)
Culture: NO GROWTH
Culture: NO GROWTH
Special Requests: ADEQUATE
Special Requests: ADEQUATE

## 2022-06-07 ENCOUNTER — Emergency Department (HOSPITAL_COMMUNITY): Payer: Medicare Other

## 2022-06-07 ENCOUNTER — Emergency Department (HOSPITAL_COMMUNITY)
Admission: EM | Admit: 2022-06-07 | Discharge: 2022-06-08 | Disposition: A | Discharge status: Home / Self Care | Payer: Medicare Other | Attending: Emergency Medicine | Admitting: Emergency Medicine

## 2022-06-07 ENCOUNTER — Encounter (HOSPITAL_COMMUNITY): Payer: Self-pay

## 2022-06-07 ENCOUNTER — Emergency Department (HOSPITAL_BASED_OUTPATIENT_CLINIC_OR_DEPARTMENT_OTHER): Payer: Medicare Other

## 2022-06-07 ENCOUNTER — Other Ambulatory Visit: Payer: Self-pay

## 2022-06-07 DIAGNOSIS — M7989 Other specified soft tissue disorders: Secondary | ICD-10-CM

## 2022-06-07 DIAGNOSIS — R600 Localized edema: Secondary | ICD-10-CM | POA: Insufficient documentation

## 2022-06-07 DIAGNOSIS — G309 Alzheimer's disease, unspecified: Secondary | ICD-10-CM | POA: Insufficient documentation

## 2022-06-07 DIAGNOSIS — R2243 Localized swelling, mass and lump, lower limb, bilateral: Secondary | ICD-10-CM | POA: Diagnosis present

## 2022-06-07 LAB — CBC WITH DIFFERENTIAL/PLATELET
Abs Immature Granulocytes: 0.03 10*3/uL (ref 0.00–0.07)
Basophils Absolute: 0.1 10*3/uL (ref 0.0–0.1)
Basophils Relative: 1 %
Eosinophils Absolute: 0.1 10*3/uL (ref 0.0–0.5)
Eosinophils Relative: 1 %
HCT: 41.4 % (ref 36.0–46.0)
Hemoglobin: 12.6 g/dL (ref 12.0–15.0)
Immature Granulocytes: 0 %
Lymphocytes Relative: 18 %
Lymphs Abs: 2 10*3/uL (ref 0.7–4.0)
MCH: 28.8 pg (ref 26.0–34.0)
MCHC: 30.4 g/dL (ref 30.0–36.0)
MCV: 94.7 fL (ref 80.0–100.0)
Monocytes Absolute: 1.1 10*3/uL — ABNORMAL HIGH (ref 0.1–1.0)
Monocytes Relative: 10 %
Neutro Abs: 7.5 10*3/uL (ref 1.7–7.7)
Neutrophils Relative %: 70 %
Platelets: 292 10*3/uL (ref 150–400)
RBC: 4.37 MIL/uL (ref 3.87–5.11)
RDW: 17.1 % — ABNORMAL HIGH (ref 11.5–15.5)
WBC: 10.8 10*3/uL — ABNORMAL HIGH (ref 4.0–10.5)
nRBC: 0 % (ref 0.0–0.2)

## 2022-06-07 LAB — COMPREHENSIVE METABOLIC PANEL
ALT: 35 U/L (ref 0–44)
AST: 28 U/L (ref 15–41)
Albumin: 3.5 g/dL (ref 3.5–5.0)
Alkaline Phosphatase: 151 U/L — ABNORMAL HIGH (ref 38–126)
Anion gap: 9 (ref 5–15)
BUN: 21 mg/dL (ref 8–23)
CO2: 26 mmol/L (ref 22–32)
Calcium: 9 mg/dL (ref 8.9–10.3)
Chloride: 102 mmol/L (ref 98–111)
Creatinine, Ser: 1.3 mg/dL — ABNORMAL HIGH (ref 0.44–1.00)
GFR, Estimated: 41 mL/min — ABNORMAL LOW (ref >60)
Glucose, Bld: 100 mg/dL — ABNORMAL HIGH (ref 70–99)
Potassium: 4.3 mmol/L (ref 3.5–5.1)
Sodium: 137 mmol/L (ref 135–145)
Total Bilirubin: 0.5 mg/dL (ref 0.3–1.2)
Total Protein: 7.4 g/dL (ref 6.5–8.1)

## 2022-06-07 LAB — BRAIN NATRIURETIC PEPTIDE: B Natriuretic Peptide: 373.1 pg/mL — ABNORMAL HIGH (ref 0.0–100.0)

## 2022-06-07 NOTE — ED Notes (Signed)
Attempted to contact next of kin, Jazmynne Budish, but went straight to voicemail

## 2022-06-07 NOTE — ED Triage Notes (Signed)
Per EMS report, pt's lower legs have been swollen for about a month and the doctor wanted them evaluated. Has no hx of CHF or A fib.   According to [name], the pt's grandmother, the pt has been falling recently with the last time being today. Isn't on blood thinners. Has a hx of Alzheimers and is a poor historian.

## 2022-06-07 NOTE — Progress Notes (Signed)
Bilateral lower extremity venous study completed.   Preliminary results relayed to MD and RN.  Please see CV Procedures for preliminary results.  Christene Lye, RVT  4:49 PM 06/07/22

## 2022-06-07 NOTE — ED Provider Notes (Signed)
[place] EMERGENCY DEPARTMENT AT [facility] Provider Note   CSN: [id number] Arrival date & time: 06/07/22  1519     History Chief Complaint  Patient presents with   Leg Swelling    Bilateral lower leg swelling    HPI Ann Cole is a 82 y.o. female presenting for chief complaint of fall.  She is a 82 year old female with Alzheimer's disease who lives at a skilled nursing facility.  Attempted to call the daughter to get any collateral history.  Patient cannot provide any.  No answer on my phone call to the daughter.  Earlier, the nurse had been successfully getting history from the daughter and per the daughter, the patient has been falling at home otherwise at her mental status baseline but is having bilateral lower extremity swelling that has been persistent over the last month with some lower extremity erythema.   Patient's recorded medical, surgical, social, medication list and allergies were reviewed in the Snapshot window as part of the initial history.   Review of Systems   Review of Systems  Unable to perform ROS: Dementia    Physical Exam Updated Vital Signs BP (!) 138/94   Pulse 78   Temp 97.8 F (36.6 C) (Oral)   Resp 15   Ht 5' (1.524 m)   Wt 104.3 kg   SpO2 97%   BMI 44.92 kg/m  Physical Exam Vitals and nursing note reviewed.  Constitutional:      General: She is not in acute distress.    Appearance: She is well-developed.  HENT:     Head: Normocephalic and atraumatic.  Eyes:     Conjunctiva/sclera: Conjunctivae normal.  Cardiovascular:     Rate and Rhythm: Normal rate and regular rhythm.     Heart sounds: No murmur heard. Pulmonary:     Effort: Pulmonary effort is normal. No respiratory distress.     Breath sounds: Normal breath sounds.  Abdominal:     General: There is no distension.     Palpations: Abdomen is soft.     Tenderness: There is no abdominal tenderness. There is no right CVA tenderness or left CVA tenderness.   Musculoskeletal:        General: No swelling or tenderness. Normal range of motion.     Cervical back: Neck supple.     Right lower leg: Edema present.     Left lower leg: Edema present.  Skin:    General: Skin is warm and dry.  Neurological:     General: No focal deficit present.     Mental Status: She is alert and oriented to person, place, and time. Mental status is at baseline.     Cranial Nerves: No cranial nerve deficit.      ED Course/ Medical Decision Making/ A&P    Procedures Procedures   Medications Ordered in ED Medications - No data to display Medical Decision Making:   Ann Cole is a 82 y.o. female who presented to the ED today with a fall at their living facility detailed above. They are not on a blood thinner. Handoff received from EMS.  Patient placed on continuous vitals and telemetry monitoring while in ED which was reviewed periodically.   Complete initial physical exam performed, notably the patient  was hemodynamically stable  in no acute distress. No obvious deformities or injuries appreciated on extensive physical exam including active range of motion of all joints.     Reviewed and confirmed nursing documentation for past medical  history, family history, social history.    Initial Assessment/Plan:   This is a patient presenting with a moderate blunt mechanism trauma.  As such, I have considered intracranial injuries including intracranial hemorrhage, intrathoracic injuries including blunt myocardial or blunt lung injury, blunt abdominal injuries including aortic dissection, bladder injury, spleen injury, liver injury and I have considered orthopedic injuries including extremity or spinal injury. This is most consistent with an acute life/limb threatening illness complicated by underlying chronic conditions.  With the patient's presentation of moderate mechanism trauma but an otherwise reassuring exam, patient warrants targeted evaluation for  potential traumatic injuries. Will proceed with targeted evaluation for potential injuries. Will proceed with CT Head, Cervical Spine CT, and Chest/Pelvis XR.   Additionally, patient with pain at BL LE. Will evaluate with DVT US Series Images reviewed and agree with radiology interpretation.  VAS [place] LOWER EXTREMITY VENOUS (DVT) (ONLY [facility] & [facility])  Result Date: 06/07/2022  Lower Venous DVT Study Patient Name:  Ann Cole  Date of Exam:   06/07/2022 Medical Rec #: [id number]              Accession #:    [id number] Date of Birth: [birth year]              Patient Gender: F Patient Age:   27 years Exam Location:  [facility] Procedure:      VAS [place] LOWER EXTREMITY VENOUS (DVT) Referring Phys: Almeta Monas Adlean Hardeman --------------------------------------------------------------------------------  Indications: Swelling, and Pain.  Limitations: Body habitus, poor ultrasound/tissue interface and Patient movement. Comparison Study: No previous study. Performing Technologist: McKayla Maag RVT, [place]  Examination Guidelines: A complete evaluation includes B-mode imaging, spectral Doppler, color Doppler, and power Doppler as needed of all accessible portions of each vessel. Bilateral testing is considered an integral part of a complete examination. Limited examinations for reoccurring indications may be performed as noted. The reflux portion of the exam is performed with the patient in reverse Trendelenburg.  +-------+---------------+---------+-----------+----------+----------------+ RIGHT  CompressibilityPhasicitySpontaneityPropertiesThrombus Aging   +-------+---------------+---------+-----------+----------+----------------+ CFV                   Yes      Yes                  Patent by color. +-------+---------------+---------+-----------+----------+----------------+ SFJ                   Yes      Yes                  Patent by color.  +-------+---------------+---------+-----------+----------+----------------+ FV Prox               Yes      Yes                  Patent by color. +-------+---------------+---------+-----------+----------+----------------+ FV Mid                Yes      Yes                  Patent by color. +-------+---------------+---------+-----------+----------+----------------+ PFV                   Yes      Yes                  Patent by color. +-------+---------------+---------+-----------+----------+----------------+ POP    Full           Yes      Yes                                   +-------+---------------+---------+-----------+----------+----------------+  PTV                                                 Not visualized   +-------+---------------+---------+-----------+----------+----------------+ PERO                                                Not visualized   +-------+---------------+---------+-----------+----------+----------------+   +---------+---------------+---------+-----------+----------+----------------+ LEFT     CompressibilityPhasicitySpontaneityPropertiesThrombus Aging   +---------+---------------+---------+-----------+----------+----------------+ CFV                     Yes      Yes                  Patent by color. +---------+---------------+---------+-----------+----------+----------------+ SFJ                     Yes      Yes                  Patent by color. +---------+---------------+---------+-----------+----------+----------------+ FV Prox                 Yes      Yes                  Patent by color. +---------+---------------+---------+-----------+----------+----------------+ FV Mid                  Yes      Yes                  Patent by color. +---------+---------------+---------+-----------+----------+----------------+ FV Distal                                             Not visualized    +---------+---------------+---------+-----------+----------+----------------+ PFV                     Yes      Yes                  Patent by color. +---------+---------------+---------+-----------+----------+----------------+ POP                     Yes      Yes                  Patent by color. +---------+---------------+---------+-----------+----------+----------------+ PTV                                                   Not visualized   +---------+---------------+---------+-----------+----------+----------------+ PERO                                                  Not visualized   +---------+---------------+---------+-----------+----------+----------------+     Summary: RIGHT: - There is no evidence of deep vein thrombosis in the lower extremity. However, portions of this examination were limited- see technologist  comments above.  - No cystic structure found in the popliteal fossa.  LEFT: - There is no evidence of deep vein thrombosis in the lower extremity. However, portions of this examination were limited- see technologist comments above.  - No cystic structure found in the popliteal fossa.  *See table(s) above for measurements and observations. Electronically signed by Coral Else MD on 06/07/2022 at 7:19:03 PM.    Final    CT HEAD WO CONTRAST ( )  Result Date: 06/07/2022 CLINICAL DATA:  Head trauma altered EXAM: CT HEAD WITHOUT CONTRAST CT CERVICAL SPINE WITHOUT CONTRAST TECHNIQUE: Multidetector CT imaging of the head and cervical spine was performed following the standard protocol without intravenous contrast. Multiplanar CT image reconstructions of the cervical spine were also generated. RADIATION DOSE REDUCTION: This exam was performed according to the departmental dose-optimization program which includes automated exposure control, adjustment of the mA and/or kV according to patient size and/or use of iterative reconstruction technique. COMPARISON:  None Available.  FINDINGS: CT HEAD FINDINGS Brain: Degraded by artifact, motion, and suboptimal positioning. No gross territorial infarction, hemorrhage or intracranial mass. Moderate atrophy. Moderate chronic small vessel ischemic changes of the white matter. Prominent ventricles felt secondary to atrophy. Posterior fossa is incompletely included on head CT. Vascular: Carotid vascular calcification Skull: Normal. Negative for fracture or focal lesion. Sinuses/Orbits: No acute finding. Other: None CT CERVICAL SPINE FINDINGS Alignment: Straightening of the cervical spine. No subluxation. Facet alignment is within normal limits. Skull base and vertebrae: No acute fracture. No primary bone lesion or focal pathologic process. Soft tissues and spinal canal: No prevertebral fluid or swelling. No visible canal hematoma. Disc levels: Multilevel degenerative changes. Moderate disc space narrowing and degenerative change C4-C5, C5-C6 and C6-C7. Facet degenerative changes at multiple levels with foraminal narrowing Upper chest: Negative. Other: None IMPRESSION: Degraded exam secondary to artifact, motion and suboptimal positioning. No definite acute intracranial abnormality. Atrophy and chronic small vessel ischemic changes of the white matter. Straightening of the cervical spine with degenerative changes. No acute osseous abnormality. Electronically Signed   By: Jasmine Pang M.D.   On: 06/07/2022 17:19   CT CERVICAL SPINE WO CONTRAST  Result Date: 06/07/2022 CLINICAL DATA:  Head trauma altered EXAM: CT HEAD WITHOUT CONTRAST CT CERVICAL SPINE WITHOUT CONTRAST TECHNIQUE: Multidetector CT imaging of the head and cervical spine was performed following the standard protocol without intravenous contrast. Multiplanar CT image reconstructions of the cervical spine were also generated. RADIATION DOSE REDUCTION: This exam was performed according to the departmental dose-optimization program which includes automated exposure control, adjustment of  the mA and/or kV according to patient size and/or use of iterative reconstruction technique. COMPARISON:  None Available. FINDINGS: CT HEAD FINDINGS Brain: Degraded by artifact, motion, and suboptimal positioning. No gross territorial infarction, hemorrhage or intracranial mass. Moderate atrophy. Moderate chronic small vessel ischemic changes of the white matter. Prominent ventricles felt secondary to atrophy. Posterior fossa is incompletely included on head CT. Vascular: Carotid vascular calcification Skull: Normal. Negative for fracture or focal lesion. Sinuses/Orbits: No acute finding. Other: None CT CERVICAL SPINE FINDINGS Alignment: Straightening of the cervical spine. No subluxation. Facet alignment is within normal limits. Skull base and vertebrae: No acute fracture. No primary bone lesion or focal pathologic process. Soft tissues and spinal canal: No prevertebral fluid or swelling. No visible canal hematoma. Disc levels: Multilevel degenerative changes. Moderate disc space narrowing and degenerative change C4-C5, C5-C6 and C6-C7. Facet degenerative changes at multiple levels with foraminal narrowing Upper chest: Negative. Other: None IMPRESSION: Degraded  exam secondary to artifact, motion and suboptimal positioning. No definite acute intracranial abnormality. Atrophy and chronic small vessel ischemic changes of the white matter. Straightening of the cervical spine with degenerative changes. No acute osseous abnormality. Electronically Signed   By: Jasmine Pang M.D.   On: 06/07/2022 17:19   DG Pelvis Portable  Result Date: 06/07/2022 CLINICAL DATA:  Fall with leg swelling EXAM: PORTABLE PELVIS 1 VIEWS COMPARISON:  Left hips and pelvis radiograph dated 08/23/2018 FINDINGS: There is no evidence of pelvic fracture or diastasis. No pelvic bone lesions are seen. IMPRESSION: No acute fracture or dislocation. Electronically Signed   By: Agustin Cree M.D.   On: 06/07/2022 16:37   DG Chest Portable 1  View  Result Date: 06/07/2022 CLINICAL DATA:  Fall with leg swelling EXAM: PORTABLE CHEST 1 VIEW COMPARISON:  Chest radiograph dated 04/26/2021 FINDINGS: Normal lung volumes. No focal consolidations. No pleural effusion or pneumothorax. Similar cardiomediastinal silhouette. Unchanged deformity of the proximal right humerus. IMPRESSION: No active disease. Electronically Signed   By: Agustin Cree M.D.   On: 06/07/2022 16:35     Final Reassessment and Plan:   Objective findings grossly negative. Reassessed at bedside.  Patient is comfortable requesting to "go home" For further collateral history called patient's family 3 times today with no answer. Based on chart review, patient is otherwise at her mental status baseline. Leg swelling likely venous stasis, slight BNP elevation more likely age-related and unlikely to be acute heart failure especially with otherwise reassuring objective findings today. Despite extensive observation.,  No acute pathology was detected.  Patient stable for close outpatient follow-up and care management with PCP.  Disposition:  I have considered need for hospitalization, however, considering all of the above, I believe this patient is stable for discharge at this time.  Patient/family educated about specific return precautions for given chief complaint and symptoms.  Patient/family educated about follow-up with PCP.     atient/family expressed understanding of return precautions and need for follow-up. Patient spoken to regarding all imaging and laboratory results and appropriate follow up for these results. All education provided in verbal form with additional information in written form. Time was allowed for answering of patient questions. Patient discharged.    Emergency Department Medication Summary:   Medications - No data to display          Clinical Impression:  1. Leg edema      Discharge   Final Clinical Impression(s) / ED Diagnoses Final diagnoses:   Leg edema    Rx / DC Orders ED Discharge Orders     None         Glyn Ade, MD 06/07/22 2317

## 2022-06-20 ENCOUNTER — Emergency Department (HOSPITAL_COMMUNITY): Payer: Medicare Other

## 2022-06-20 ENCOUNTER — Encounter (HOSPITAL_COMMUNITY): Payer: Self-pay

## 2022-06-20 ENCOUNTER — Inpatient Hospital Stay (HOSPITAL_COMMUNITY)
Admission: EM | Admit: 2022-06-20 | Discharge: 2022-06-29 | DRG: 291 | Disposition: A | Discharge status: Skilled Nursing Facility | Payer: Medicare Other | Source: Skilled Nursing Facility | Attending: Internal Medicine | Admitting: Internal Medicine

## 2022-06-20 ENCOUNTER — Other Ambulatory Visit: Payer: Self-pay

## 2022-06-20 DIAGNOSIS — I272 Pulmonary hypertension, unspecified: Secondary | ICD-10-CM | POA: Diagnosis present

## 2022-06-20 DIAGNOSIS — I110 Hypertensive heart disease with heart failure: Principal | ICD-10-CM | POA: Diagnosis present

## 2022-06-20 DIAGNOSIS — I10 Essential (primary) hypertension: Secondary | ICD-10-CM | POA: Diagnosis present

## 2022-06-20 DIAGNOSIS — Y846 Urinary catheterization as the cause of abnormal reaction of the patient, or of later complication, without mention of misadventure at the time of the procedure: Secondary | ICD-10-CM | POA: Diagnosis not present

## 2022-06-20 DIAGNOSIS — F0390 Unspecified dementia without behavioral disturbance: Secondary | ICD-10-CM | POA: Diagnosis present

## 2022-06-20 DIAGNOSIS — R5381 Other malaise: Secondary | ICD-10-CM | POA: Diagnosis present

## 2022-06-20 DIAGNOSIS — I4892 Unspecified atrial flutter: Secondary | ICD-10-CM | POA: Diagnosis present

## 2022-06-20 DIAGNOSIS — M7989 Other specified soft tissue disorders: Secondary | ICD-10-CM | POA: Diagnosis not present

## 2022-06-20 DIAGNOSIS — Z7189 Other specified counseling: Secondary | ICD-10-CM | POA: Diagnosis not present

## 2022-06-20 DIAGNOSIS — I959 Hypotension, unspecified: Secondary | ICD-10-CM | POA: Diagnosis not present

## 2022-06-20 DIAGNOSIS — Z79899 Other long term (current) drug therapy: Secondary | ICD-10-CM | POA: Diagnosis not present

## 2022-06-20 DIAGNOSIS — R296 Repeated falls: Secondary | ICD-10-CM | POA: Diagnosis present

## 2022-06-20 DIAGNOSIS — Z603 Acculturation difficulty: Secondary | ICD-10-CM | POA: Diagnosis present

## 2022-06-20 DIAGNOSIS — M79642 Pain in left hand: Secondary | ICD-10-CM | POA: Diagnosis not present

## 2022-06-20 DIAGNOSIS — R4189 Other symptoms and signs involving cognitive functions and awareness: Secondary | ICD-10-CM | POA: Diagnosis present

## 2022-06-20 DIAGNOSIS — Z6841 Body Mass Index (BMI) 40.0 and over, adult: Secondary | ICD-10-CM

## 2022-06-20 DIAGNOSIS — Z79890 Hormone replacement therapy (postmenopausal): Secondary | ICD-10-CM | POA: Diagnosis not present

## 2022-06-20 DIAGNOSIS — Z823 Family history of stroke: Secondary | ICD-10-CM | POA: Diagnosis not present

## 2022-06-20 DIAGNOSIS — Z818 Family history of other mental and behavioral disorders: Secondary | ICD-10-CM

## 2022-06-20 DIAGNOSIS — N390 Urinary tract infection, site not specified: Secondary | ICD-10-CM | POA: Diagnosis not present

## 2022-06-20 DIAGNOSIS — Z1152 Encounter for screening for COVID-19: Secondary | ICD-10-CM

## 2022-06-20 DIAGNOSIS — I5031 Acute diastolic (congestive) heart failure: Secondary | ICD-10-CM | POA: Diagnosis not present

## 2022-06-20 DIAGNOSIS — I5032 Chronic diastolic (congestive) heart failure: Secondary | ICD-10-CM | POA: Diagnosis present

## 2022-06-20 DIAGNOSIS — B952 Enterococcus as the cause of diseases classified elsewhere: Secondary | ICD-10-CM | POA: Diagnosis not present

## 2022-06-20 DIAGNOSIS — Z8249 Family history of ischemic heart disease and other diseases of the circulatory system: Secondary | ICD-10-CM | POA: Diagnosis not present

## 2022-06-20 DIAGNOSIS — I480 Paroxysmal atrial fibrillation: Secondary | ICD-10-CM | POA: Diagnosis present

## 2022-06-20 DIAGNOSIS — E039 Hypothyroidism, unspecified: Secondary | ICD-10-CM | POA: Diagnosis present

## 2022-06-20 DIAGNOSIS — N179 Acute kidney failure, unspecified: Secondary | ICD-10-CM | POA: Diagnosis not present

## 2022-06-20 DIAGNOSIS — E785 Hyperlipidemia, unspecified: Secondary | ICD-10-CM | POA: Diagnosis present

## 2022-06-20 DIAGNOSIS — Z515 Encounter for palliative care: Secondary | ICD-10-CM

## 2022-06-20 DIAGNOSIS — I5033 Acute on chronic diastolic (congestive) heart failure: Secondary | ICD-10-CM | POA: Diagnosis present

## 2022-06-20 DIAGNOSIS — T83518A Infection and inflammatory reaction due to other urinary catheter, initial encounter: Secondary | ICD-10-CM | POA: Diagnosis not present

## 2022-06-20 DIAGNOSIS — E66813 Obesity, class 3: Secondary | ICD-10-CM

## 2022-06-20 DIAGNOSIS — R062 Wheezing: Secondary | ICD-10-CM

## 2022-06-20 DIAGNOSIS — I509 Heart failure, unspecified: Principal | ICD-10-CM

## 2022-06-20 LAB — CBC WITH DIFFERENTIAL/PLATELET
Abs Immature Granulocytes: 0.07 10*3/uL (ref 0.00–0.07)
Basophils Absolute: 0.1 10*3/uL (ref 0.0–0.1)
Basophils Relative: 1 %
Eosinophils Absolute: 0 10*3/uL (ref 0.0–0.5)
Eosinophils Relative: 0 %
HCT: 37.6 % (ref 36.0–46.0)
Hemoglobin: 11.8 g/dL — ABNORMAL LOW (ref 12.0–15.0)
Immature Granulocytes: 1 %
Lymphocytes Relative: 17 %
Lymphs Abs: 1.7 10*3/uL (ref 0.7–4.0)
MCH: 29 pg (ref 26.0–34.0)
MCHC: 31.4 g/dL (ref 30.0–36.0)
MCV: 92.4 fL (ref 80.0–100.0)
Monocytes Absolute: 1.1 10*3/uL — ABNORMAL HIGH (ref 0.1–1.0)
Monocytes Relative: 11 %
Neutro Abs: 6.9 10*3/uL (ref 1.7–7.7)
Neutrophils Relative %: 70 %
Platelets: 287 10*3/uL (ref 150–400)
RBC: 4.07 MIL/uL (ref 3.87–5.11)
RDW: 17.2 % — ABNORMAL HIGH (ref 11.5–15.5)
WBC: 9.9 10*3/uL (ref 4.0–10.5)
nRBC: 0 % (ref 0.0–0.2)

## 2022-06-20 LAB — COMPREHENSIVE METABOLIC PANEL
ALT: 34 U/L (ref 0–44)
AST: 31 U/L (ref 15–41)
Albumin: 3.1 g/dL — ABNORMAL LOW (ref 3.5–5.0)
Alkaline Phosphatase: 121 U/L (ref 38–126)
Anion gap: 8 (ref 5–15)
BUN: 16 mg/dL (ref 8–23)
CO2: 24 mmol/L (ref 22–32)
Calcium: 8.9 mg/dL (ref 8.9–10.3)
Chloride: 105 mmol/L (ref 98–111)
Creatinine, Ser: 1 mg/dL (ref 0.44–1.00)
GFR, Estimated: 56 mL/min — ABNORMAL LOW (ref >60)
Glucose, Bld: 106 mg/dL — ABNORMAL HIGH (ref 70–99)
Potassium: 4.2 mmol/L (ref 3.5–5.1)
Sodium: 137 mmol/L (ref 135–145)
Total Bilirubin: 0.4 mg/dL (ref 0.3–1.2)
Total Protein: 6.8 g/dL (ref 6.5–8.1)

## 2022-06-20 LAB — TROPONIN I (HIGH SENSITIVITY)
Troponin I (High Sensitivity): 15 ng/L (ref <18)
Troponin I (High Sensitivity): 14 ng/L (ref <18)

## 2022-06-20 LAB — CBG MONITORING, ED: Glucose-Capillary: 104 mg/dL — ABNORMAL HIGH (ref 70–99)

## 2022-06-20 LAB — SARS CORONAVIRUS 2 BY RT PCR: SARS Coronavirus 2 by RT PCR: NEGATIVE

## 2022-06-20 LAB — BRAIN NATRIURETIC PEPTIDE: B Natriuretic Peptide: 327.4 pg/mL — ABNORMAL HIGH (ref 0.0–100.0)

## 2022-06-20 MED ORDER — LEVOTHYROXINE SODIUM 100 MCG PO TABS
100.0000 ug | ORAL_TABLET | Freq: Every day | ORAL | Status: DC
  Administered 2022-06-22 – 2022-06-29 (×8): 100 ug via ORAL
  Filled 2022-06-20 (×8): qty 1

## 2022-06-20 MED ORDER — ENOXAPARIN SODIUM 40 MG/0.4ML IJ SOSY
40.0000 mg | PREFILLED_SYRINGE | INTRAMUSCULAR | Status: DC
  Administered 2022-06-20 – 2022-06-26 (×7): 40 mg via SUBCUTANEOUS
  Filled 2022-06-20 (×7): qty 0.4

## 2022-06-20 MED ORDER — MELATONIN 5 MG PO TABS
5.0000 mg | ORAL_TABLET | Freq: Every day | ORAL | Status: DC
  Administered 2022-06-20 – 2022-06-28 (×9): 5 mg via ORAL
  Filled 2022-06-20 (×9): qty 1

## 2022-06-20 MED ORDER — SODIUM CHLORIDE 0.9% FLUSH
3.0000 mL | Freq: Two times a day (BID) | INTRAVENOUS | Status: DC
  Administered 2022-06-20 – 2022-06-28 (×15): 3 mL via INTRAVENOUS

## 2022-06-20 MED ORDER — SODIUM CHLORIDE 0.9 % IV SOLN
1.0000 g | Freq: Once | INTRAVENOUS | Status: AC
  Administered 2022-06-20: 1 g via INTRAVENOUS
  Filled 2022-06-20: qty 10

## 2022-06-20 MED ORDER — ALBUTEROL SULFATE (2.5 MG/3ML) 0.083% IN NEBU
5.0000 mg | INHALATION_SOLUTION | Freq: Once | RESPIRATORY_TRACT | Status: AC
  Administered 2022-06-20: 5 mg via RESPIRATORY_TRACT
  Filled 2022-06-20: qty 6

## 2022-06-20 MED ORDER — ALUM & MAG HYDROXIDE-SIMETH 200-200-20 MG/5ML PO SUSP: 30.0000 mL | Freq: Four times a day (QID) | ORAL | Status: DC | PRN

## 2022-06-20 MED ORDER — HYDRALAZINE HCL 20 MG/ML IJ SOLN
5.0000 mg | Freq: Four times a day (QID) | INTRAMUSCULAR | Status: DC | PRN
  Administered 2022-06-20 – 2022-06-23 (×3): 5 mg via INTRAVENOUS
  Filled 2022-06-20 (×3): qty 1

## 2022-06-20 MED ORDER — GUAIFENESIN 100 MG/5ML PO LIQD: 200.0000 mg | Freq: Four times a day (QID) | ORAL | Status: DC | PRN

## 2022-06-20 MED ORDER — SODIUM CHLORIDE 0.9 % IV SOLN
1.0000 g | INTRAVENOUS | Status: DC
  Administered 2022-06-21: 1 g via INTRAVENOUS
  Filled 2022-06-20: qty 10

## 2022-06-20 MED ORDER — SODIUM CHLORIDE 0.9 % IV SOLN: 250.0000 mL | INTRAVENOUS | Status: DC | PRN

## 2022-06-20 MED ORDER — MAGNESIUM HYDROXIDE 400 MG/5ML PO SUSP: 30.0000 mL | Freq: Every evening | ORAL | Status: DC | PRN

## 2022-06-20 MED ORDER — ACETAMINOPHEN 500 MG PO TABS
500.0000 mg | ORAL_TABLET | Freq: Three times a day (TID) | ORAL | Status: DC
  Administered 2022-06-20 – 2022-06-29 (×24): 500 mg via ORAL
  Filled 2022-06-20 (×23): qty 1

## 2022-06-20 MED ORDER — LOPERAMIDE HCL 2 MG PO CAPS
2.0000 mg | ORAL_CAPSULE | ORAL | Status: DC | PRN
  Administered 2022-06-20: 2 mg via ORAL
  Filled 2022-06-20: qty 1

## 2022-06-20 MED ORDER — ONDANSETRON HCL 4 MG/2ML IJ SOLN: 4.0000 mg | Freq: Four times a day (QID) | INTRAMUSCULAR | Status: DC | PRN

## 2022-06-20 MED ORDER — SODIUM CHLORIDE 0.9% FLUSH: 3.0000 mL | INTRAVENOUS | Status: DC | PRN

## 2022-06-20 MED ORDER — FUROSEMIDE 10 MG/ML IJ SOLN
40.0000 mg | Freq: Once | INTRAMUSCULAR | Status: AC
  Administered 2022-06-20: 40 mg via INTRAVENOUS
  Filled 2022-06-20: qty 4

## 2022-06-20 MED ORDER — MEMANTINE HCL 10 MG PO TABS
5.0000 mg | ORAL_TABLET | Freq: Two times a day (BID) | ORAL | Status: DC
  Administered 2022-06-20 – 2022-06-29 (×18): 5 mg via ORAL
  Filled 2022-06-20 (×18): qty 1

## 2022-06-20 MED ORDER — ASPIRIN 81 MG PO TBEC
81.0000 mg | DELAYED_RELEASE_TABLET | Freq: Every day | ORAL | Status: DC
  Administered 2022-06-20 – 2022-06-29 (×10): 81 mg via ORAL
  Filled 2022-06-20 (×10): qty 1

## 2022-06-20 MED ORDER — FUROSEMIDE 10 MG/ML IJ SOLN
40.0000 mg | Freq: Two times a day (BID) | INTRAMUSCULAR | Status: DC
  Administered 2022-06-20 – 2022-06-23 (×7): 40 mg via INTRAVENOUS
  Filled 2022-06-20 (×8): qty 4

## 2022-06-20 MED ORDER — METHYLPREDNISOLONE SODIUM SUCC 125 MG IJ SOLR
125.0000 mg | Freq: Once | INTRAMUSCULAR | Status: AC
  Administered 2022-06-20: 125 mg via INTRAVENOUS
  Filled 2022-06-20: qty 2

## 2022-06-20 MED ORDER — SERTRALINE HCL 50 MG PO TABS
50.0000 mg | ORAL_TABLET | Freq: Every morning | ORAL | Status: DC
  Administered 2022-06-21 – 2022-06-29 (×9): 50 mg via ORAL
  Filled 2022-06-20 (×9): qty 1

## 2022-06-20 MED ORDER — ACETAMINOPHEN 325 MG PO TABS
650.0000 mg | ORAL_TABLET | ORAL | Status: DC | PRN
  Administered 2022-06-24: 650 mg via ORAL
  Filled 2022-06-20: qty 2

## 2022-06-20 MED ORDER — SODIUM CHLORIDE 0.9 % IV SOLN
500.0000 mg | Freq: Once | INTRAVENOUS | Status: AC
  Administered 2022-06-20: 500 mg via INTRAVENOUS
  Filled 2022-06-20: qty 5

## 2022-06-20 MED ORDER — METOPROLOL SUCCINATE ER 50 MG PO TB24
75.0000 mg | ORAL_TABLET | Freq: Every day | ORAL | Status: DC
  Administered 2022-06-20 – 2022-06-24 (×5): 75 mg via ORAL
  Filled 2022-06-20 (×6): qty 1

## 2022-06-20 NOTE — ED Provider Notes (Signed)
EMERGENCY DEPARTMENT AT [facility] Provider Note   CSN: [id number] Arrival date & time: 06/20/22  1043     History {Add pertinent medical, surgical, social history, OB history to HPI:1} Chief Complaint  Patient presents with   Shortness of Breath   Leg Swelling    Nelta Dunsford is a 82 y.o. female.  HPI 5 caveat secondary to dementia language barrier 82 year old female presents today with reports of decreased mental status, increased swelling of lower extremities, difficulty breathing.     Home Medications Prior to Admission medications   Medication Sig Start Date End Date Taking? Authorizing Provider  acetaminophen (TYLENOL) 500 MG tablet Take 500 mg by mouth See admin instructions. Take one tablet (500 mg) by mouth every 8 hours, scheduled - may also take one tablet (500 mg) every 6 hours as needed for headache, minor discomfort, fever 99.5-101 F    [provider]  alum & mag hydroxide-simeth (MAALOX/MYLANTA) 200-200-20 MG/5ML suspension Take 30 mLs by mouth every 6 (six) hours as needed for indigestion or heartburn (NOT TO EXCEED 4 DOSES IN 24HRS).    [provider]  aspirin EC 325 MG EC tablet Take 1 tablet (325 mg total) by mouth daily. Patient not taking: Reported on 04/27/2021 08/25/18   Osvaldo Shipper, MD  ferrous sulfate 325 (65 FE) MG tablet Take 1 tablet (325 mg total) by mouth daily with breakfast. 04/29/21 05/29/21  Verdia Kuba, DO  guaifenesin (ROBITUSSIN) 100 MG/5ML syrup Take 200 mg by mouth every 6 (six) hours as needed for cough.    [provider]  levothyroxine (SYNTHROID) 100 MCG tablet Take 100 mcg by mouth See admin instructions. Take one tablet (100 mcg) by mouth daily before breakfast (on Mondays also take 25 mcg tablet) 04/14/20   [provider]  levothyroxine (SYNTHROID) 25 MCG tablet Take 25 mcg by mouth See admin instructions. Take one tablet (25 mcg) by mouth on Mondays before breakfast  (with a 100 mcg tablet for a total dose of 125 mcg). Take 100 mcg on all other days of the week    [provider]  loperamide (IMODIUM) 2 MG capsule Take 2 mg by mouth as needed for diarrhea or loose stools (DO NOT EXCEED 8 DOSES IN 24HRS).    [provider]  magnesium hydroxide (MILK OF MAGNESIA) 400 MG/5ML suspension Take 30 mLs by mouth at bedtime as needed (constipation).    [provider]  Melatonin 5 MG TABS Take 5 mg by mouth at bedtime.    [provider]  memantine (NAMENDA) 5 MG tablet Take 1 tablet (5 mg total) by mouth 2 (two) times daily. 11/29/17   Deeann Saint, MD  metoprolol succinate (TOPROL-XL) 25 MG 24 hr tablet Take 3 tablets (75 mg total) by mouth daily. Take with or immediately following a meal. 04/29/21 05/29/21  Lynwood Dawley S, DO  sertraline (ZOLOFT) 50 MG tablet Take 50 mg by mouth every morning.    [provider]      Allergies    Patient has no known allergies.    Review of Systems   Review of Systems  Physical Exam Updated Vital Signs BP (!) 152/79   Pulse 84   Temp (!) 97.4 F (36.3 C) (Rectal)   Resp (!) 24   Ht 1.524 m (5')   Wt 104.3 kg   SpO2 95%   BMI 44.92 kg/m  Physical Exam Vitals and nursing note reviewed.  Constitutional:  General: She is not in acute distress.    Appearance: She is obese. She is ill-appearing.  HENT:     Head: Normocephalic.     Mouth/Throat:     Mouth: Mucous membranes are moist.  Eyes:     Pupils: Pupils are equal, round, and reactive to light.  Cardiovascular:     Rate and Rhythm: Normal rate and regular rhythm.  Pulmonary:     Breath sounds: Decreased breath sounds present.     Comments: Diffusely decreased breath sounds with expiratory wheezing Musculoskeletal:     Cervical back: Normal range of motion.  Neurological:     Mental Status: She is alert.     ED Results / Procedures / Treatments   Labs (all labs ordered are listed, but only abnormal  results are displayed) Labs Reviewed  BRAIN NATRIURETIC PEPTIDE - Abnormal; Notable for the following components:      Result Value   B Natriuretic Peptide 327.4 (*)    All other components within normal limits  CBC WITH DIFFERENTIAL/PLATELET - Abnormal; Notable for the following components:   Hemoglobin 11.8 (*)    RDW 17.2 (*)    Monocytes Absolute 1.1 (*)    All other components within normal limits  COMPREHENSIVE METABOLIC PANEL - Abnormal; Notable for the following components:   Glucose, Bld 106 (*)    Albumin 3.1 (*)    GFR, Estimated 56 (*)    All other components within normal limits  CBG MONITORING, ED - Abnormal; Notable for the following components:   Glucose-Capillary 104 (*)    All other components within normal limits  SARS CORONAVIRUS 2 BY RT PCR    EKG EKG Interpretation  Date/Time:  Monday June 20 2022 10:59:47 EDT Ventricular Rate:  64 PR Interval:    QRS Duration: 87 QT Interval:  424 QTC Calculation: 438 R Axis:   -17 Text Interpretation: Atrial flutter with predominant 4:1 AV block Borderline left axis deviation Borderline repolarization abnormality No significant change since last tracing of 07 Jun 2022 Confirmed by Margarita Grizzle [phone]) on 06/20/2022 1:07:16 PM  Radiology DG Chest Port 1 View  Result Date: 06/20/2022 CLINICAL DATA:  Dyspnea EXAM: PORTABLE CHEST 1 VIEW COMPARISON:  Chest radiograph dated 06/07/2022 FINDINGS: Low lung volumes with bronchovascular crowding. Mild interstitial opacities. Bibasilar patchy opacities. No pleural effusion or pneumothorax. Similar enlarged cardiomediastinal silhouette. Similar deformity of the right humeral head. IMPRESSION: 1. Low lung volumes with bronchovascular crowding. Mild interstitial opacities, which may represent pulmonary edema. 2. Bibasilar patchy opacities, likely atelectasis. Electronically Signed   By: Agustin Cree M.D.   On: 06/20/2022 12:23    Procedures Procedures  {Document cardiac monitor, telemetry  assessment procedure when appropriate:1}  Medications Ordered in ED Medications  azithromycin (ZITHROMAX) 500 mg in sodium chloride 0.9 % 250 mL IVPB (has no administration in time range)  furosemide (LASIX) injection 40 mg (has no administration in time range)  methylPREDNISolone sodium succinate (SOLU-MEDROL) 125 mg/2 mL injection 125 mg (125 mg Intravenous Given 06/20/22 1523)  albuterol (PROVENTIL) (2.5 MG/3ML) 0.083% nebulizer solution 5 mg (5 mg Nebulization Given 06/20/22 1527)  cefTRIAXone (ROCEPHIN) 1 g in sodium chloride 0.9 % 100 mL IVPB (1 g Intravenous New Bag/Given 06/20/22 1526)    ED Course/ Medical Decision Making/ A&P Clinical Course as of 06/20/22 1558  Mon Jun 20, 2022  1305 Chest x-Katrena Stehlin reviewed interpreted and is significant for patchy diffuse infiltrates radiologist interpretation notes low lung volumes with bronchovascular crowding, mild interstitial opacities which  may represent pulmonary edema, bibasilar patchy opacities likely atelectasis [DR]    Clinical Course User Index [DR] Margarita Grizzle, MD   {   Click here for ABCD2, HEART and other calculatorsREFRESH Note before signing :1}                          Medical Decision Making Amount and/or Complexity of Data Reviewed Labs: ordered. Radiology: ordered.  Risk Prescription drug management.   82 year old female with dementia, history of chronic diastolic heart failure, paroxysmal A-fib, chronic anemia presents today with increased difficulty breathing.  On exam she has diffuse expiratory wheezes, increased markings consistent with some CHF versus infection Patient treated here with Solu-Medrol, albuterol, Rocephin, Zithromax, and Lasix. She continues to have some increased respiratory rate but does not have a new oxygen requirement. EKG is significant for atrial flutter with 4 1 block Plan and admission for ongoing evaluation and treatment  {Document critical care time when appropriate:1} {Document review of  labs and clinical decision tools ie heart score, Chads2Vasc2 etc:1}  {Document your independent review of radiology images, and any outside records:1} {Document your discussion with family members, caretakers, and with consultants:1} {Document social determinants of health affecting pt's care:1} {Document your decision making why or why not admission, treatments were needed:1} Final Clinical Impression(s) / ED Diagnoses Final diagnoses:  None    Rx / DC Orders ED Discharge Orders     None

## 2022-06-20 NOTE — H&P (Signed)
History and Physical    Ann Cole:[id number] DOB: 10/02/1940 DOA: 06/20/2022  PCP: Deeann Saint, MD (Confirm with patient/family/NH records and if not entered, this has to be entered at [facility] point of entry) Patient coming from: SNF  I have personally briefly reviewed patient's old medical records in [facility]  Chief Complaint: SOB  HPI: Ann Cole is a 82 y.o. female with medical history significant of advanced dementia, chronic HFpEF, PAF not on systemic anticoagulation, hypothyroidism, HTN, chronic normocytic anemia, sent from nursing home for evaluation of worsening of dyspnea and lower extremity swelling.  Patient reported to blood test of worsening of shortness of breath at baseline, no cough no fever or chills no chest pains.  Denies any abdominal pain no diarrhea no urinary symptoms.  Granddaughter reported that the patient was last seen normal last week without any breathing issue or any specific complaints.  Family also reported that the patient did tend to have uncontrolled calorie intake but no history of aspiration, noted cough or choke after eating or drinking.  She eats regular texture diet.   ED Course: Afebrile, blood pressure elevated 170/90, O2 saturation 95% on room air afebrile.  Chest x-ray showed cardiomegaly and mild pulmonary edema.  Blood work showed creatinine 1.0 K4.2 bicarb 24, hemoglobin 11.8.  Patient was given DuoNebs, IV ceftriaxone and IV azithromycin and IV Lasix.  Review of Systems: As per HPI otherwise 14 point review of systems negative.    Past Medical History:  Diagnosis Date   Chronic diastolic heart failure (HCC)    Dementia (HCC)    Hyperlipidemia    Hypertension    Paroxysmal atrial fibrillation (HCC)    Thyroid disease     History reviewed. No pertinent surgical history.   reports that she has never smoked. She has never used smokeless tobacco. She reports that she does not drink alcohol and does not use  drugs.  No Known Allergies  Family History  Problem Relation Age of Onset   Heart disease Mother    Mental illness Mother    Stroke Father    Mental illness Brother    Heart disease Daughter    Alcohol abuse Son      Prior to Admission medications   Medication Sig Start Date End Date Taking? Authorizing Provider  acetaminophen (TYLENOL) 500 MG tablet Take 500 mg by mouth See admin instructions. Take one tablet (500 mg) by mouth every 8 hours, scheduled - may also take one tablet (500 mg) every 6 hours as needed for headache, minor discomfort, fever 99.5-101 F    [provider]  alum & mag hydroxide-simeth (MAALOX/MYLANTA) 200-200-20 MG/5ML suspension Take 30 mLs by mouth every 6 (six) hours as needed for indigestion or heartburn (NOT TO EXCEED 4 DOSES IN 24HRS).    [provider]  aspirin EC 325 MG EC tablet Take 1 tablet (325 mg total) by mouth daily. Patient not taking: Reported on 04/27/2021 08/25/18   Osvaldo Shipper, MD  ferrous sulfate 325 (65 FE) MG tablet Take 1 tablet (325 mg total) by mouth daily with breakfast. 04/29/21 05/29/21  Verdia Kuba, DO  guaifenesin (ROBITUSSIN) 100 MG/5ML syrup Take 200 mg by mouth every 6 (six) hours as needed for cough.    [provider]  levothyroxine (SYNTHROID) 100 MCG tablet Take 100 mcg by mouth See admin instructions. Take one tablet (100 mcg) by mouth daily before breakfast (on Mondays also take 25 mcg tablet) 04/14/20   [provider]  levothyroxine (SYNTHROID) 25 MCG tablet Take 25 mcg by mouth See admin instructions. Take one tablet (25 mcg) by mouth on Mondays before breakfast (with a 100 mcg tablet for a total dose of 125 mcg). Take 100 mcg on all other days of the week    [provider]  loperamide (IMODIUM) 2 MG capsule Take 2 mg by mouth as needed for diarrhea or loose stools (DO NOT EXCEED 8 DOSES IN 24HRS).    [provider]  magnesium hydroxide (MILK OF MAGNESIA) 400 MG/5ML  suspension Take 30 mLs by mouth at bedtime as needed (constipation).    [provider]  Melatonin 5 MG TABS Take 5 mg by mouth at bedtime.    [provider]  memantine (NAMENDA) 5 MG tablet Take 1 tablet (5 mg total) by mouth 2 (two) times daily. 11/29/17   Deeann Saint, MD  metoprolol succinate (TOPROL-XL) 25 MG 24 hr tablet Take 3 tablets (75 mg total) by mouth daily. Take with or immediately following a meal. 04/29/21 05/29/21  Lynwood Dawley S, DO  sertraline (ZOLOFT) 50 MG tablet Take 50 mg by mouth every morning.    [provider]    Physical Exam: Vitals:   06/20/22 1207 06/20/22 1318 06/20/22 1425 06/20/22 1625  BP:  (!) 178/96 (!) 152/79   Pulse:  82 84   Resp:  (!) 25 (!) 24   Temp: (!) 97.4 F (36.3 C)   (!) 97.3 F (36.3 C)  TempSrc: Rectal   Axillary  SpO2:  93% 95%   Weight:      Height:        Constitutional: NAD, calm, comfortable Vitals:   06/20/22 1207 06/20/22 1318 06/20/22 1425 06/20/22 1625  BP:  (!) 178/96 (!) 152/79   Pulse:  82 84   Resp:  (!) 25 (!) 24   Temp: (!) 97.4 F (36.3 C)   (!) 97.3 F (36.3 C)  TempSrc: Rectal   Axillary  SpO2:  93% 95%   Weight:      Height:       Eyes: PERRL, lids and conjunctivae normal ENMT: Mucous membranes are moist. Posterior pharynx clear of any exudate or lesions.Normal dentition.  Neck: normal, supple, no masses, no thyromegaly Respiratory: clear to auscultation bilaterally, no wheezing, fine crackles on bilateral lower fields, increasing respiratory effort. No accessory muscle use.  Cardiovascular: Regular rate and rhythm, systolic murmur on heart base.  2+ extremity edema right> left. 2+ pedal pulses. No carotid bruits.  Abdomen: no tenderness, no masses palpated. No hepatosplenomegaly. Bowel sounds positive.  Musculoskeletal: no clubbing / cyanosis. No joint deformity upper and lower extremities. Good ROM, no contractures. Normal muscle tone.  Skin: no rashes, lesions, ulcers. No  induration Neurologic: CN 2-12 grossly intact. Sensation intact, DTR normal. Strength 5/5 in all 4.  Psychiatric: Awake, oriented to person and place, confused about time   Labs on Admission: I have personally reviewed following labs and imaging studies  CBC: Recent Labs  Lab 06/20/22 1105  WBC 9.9  NEUTROABS 6.9  HGB 11.8*  HCT 37.6  MCV 92.4  PLT 287   Basic Metabolic Panel: Recent Labs  Lab 06/20/22 1105  NA 137  K 4.2  CL 105  CO2 24  GLUCOSE 106*  BUN 16  CREATININE 1.00  CALCIUM 8.9   GFR: Estimated Creatinine Clearance: 45.6 mL/min (by C-G formula based on SCr of 1 mg/dL). Liver Function Tests: Recent Labs  Lab 06/20/22 1105  AST 31  ALT 34  ALKPHOS 121  BILITOT 0.4  PROT 6.8  ALBUMIN 3.1*   No results for input(s): "LIPASE", "AMYLASE" in the last 168 hours. No results for input(s): "AMMONIA" in the last 168 hours. Coagulation Profile: No results for input(s): "INR", "PROTIME" in the last 168 hours. Cardiac Enzymes: No results for input(s): "CKTOTAL", "CKMB", "CKMBINDEX", "TROPONINI" in the last 168 hours. BNP (last 3 results) No results for input(s): "PROBNP" in the last 8760 hours. HbA1C: No results for input(s): "HGBA1C" in the last 72 hours. CBG: Recent Labs  Lab 06/20/22 1055  GLUCAP 104*   Lipid Profile: No results for input(s): "CHOL", "HDL", "LDLCALC", "TRIG", "CHOLHDL", "LDLDIRECT" in the last 72 hours. Thyroid Function Tests: No results for input(s): "TSH", "T4TOTAL", "FREET4", "T3FREE", "THYROIDAB" in the last 72 hours. Anemia Panel: No results for input(s): "VITAMINB12", "FOLATE", "FERRITIN", "TIBC", "IRON", "RETICCTPCT" in the last 72 hours. Urine analysis:    Component Value Date/Time   COLORURINE YELLOW 04/26/2021 1740   APPEARANCEUR HAZY (A) 04/26/2021 1740   LABSPEC 1.011 04/26/2021 1740   PHURINE 5.0 04/26/2021 1740   GLUCOSEU NEGATIVE 04/26/2021 1740   HGBUR SMALL (A) 04/26/2021 1740   BILIRUBINUR NEGATIVE 04/26/2021  1740   KETONESUR NEGATIVE 04/26/2021 1740   PROTEINUR NEGATIVE 04/26/2021 1740   NITRITE POSITIVE (A) 04/26/2021 1740   LEUKOCYTESUR SMALL (A) 04/26/2021 1740    Radiological Exams on Admission: DG Chest Port 1 View  Result Date: 06/20/2022 CLINICAL DATA:  Dyspnea EXAM: PORTABLE CHEST 1 VIEW COMPARISON:  Chest radiograph dated 06/07/2022 FINDINGS: Low lung volumes with bronchovascular crowding. Mild interstitial opacities. Bibasilar patchy opacities. No pleural effusion or pneumothorax. Similar enlarged cardiomediastinal silhouette. Similar deformity of the right humeral head. IMPRESSION: 1. Low lung volumes with bronchovascular crowding. Mild interstitial opacities, which may represent pulmonary edema. 2. Bibasilar patchy opacities, likely atelectasis. Electronically Signed   By: Agustin Cree M.D.   On: 06/20/2022 12:23    EKG: Independently reviewed.  A flutter with 4-1 transduction block, no acute ST changes.  Assessment/Plan Principal Problem:   CHF (congestive heart failure) (HCC) Active Problems:   Dementia (HCC)   Essential hypertension   Paroxysmal atrial fibrillation (HCC)   Chronic diastolic heart failure (HCC)  (please populate well all problems here in Problem List. (For example, if patient is on BP meds at home and you resume or decide to hold them, it is a problem that needs to be her. Same for CAD, COPD, HLD and so on)  Acute on chronic HFpEF, stage -Significant fluid overload of mild pulmonary edema and lower extremity edema, right> left -Continue Lasix 40 mg IV twice daily -Echocardiogram ordered -Will send troponin to rule out ACS -Appears that patient will need more stringent blood pressure control.  Start as needed hydralazine and continue 75 mg daily -Send DVT study on right leg -Other DDx, low suspicion for pneumonia at this point, given her symptoms and presentation and x-ray presentation of bilateral congestion.  UTI -Asymptomatic, continue  ceftriaxone  Hypothyroidism -Stable, continue Synthroid  PAF -Rate controlled, not on systemic anticoagulation for advanced dementia and history of syncope and fall as per discussion done in August 2021 patient was hospitalized for syncope and was found to have paroxysms of A-fib for the first time which was brief and patient returned to sinus rhythm on same admission.  Question this whether the patient's CHF decompensation is from uncontrolled A-fib.  At this point there is no strong evidence to support this hypothesis.  Plan to  continue telemonitoring x 24 hours.  May need to consider outpatient Zio patch. -Will continue aspirin  Advanced dementia -Mentation at baseline.  Discussed with granddaughter over the phone, patient is full code.  DVT prophylaxis: Lovenox Code Status: Full code Family Communication: Granddaughter over the phone Disposition Plan: Patient is sick with CHF compensation requiring IV Lasix and inpatient monitoring, expect more than 2 midnight hospital stay. Consults called: None Admission status: Telemetry admission   Emeline General MD [facility] Pager [phone]  06/20/2022, 6:03 PM

## 2022-06-20 NOTE — ED Triage Notes (Signed)
From [employer]. Called EMS for concerns for swelling in lower extremities, decreased mental status, hx of dementia. Decreased mobility. Recently seen for fluid retention. "Grunting" with breathing

## 2022-06-20 NOTE — Progress Notes (Signed)
RN received patient from the ED. RN obtained admission VS and stable. Tele monitor applied and CCMD notified. Patient is spanish speaking and RN attempted to use interpreter, patient mumble words to speak to interpreter, interpreter unable to comprehend to translate patient's request. RN relayed to interpreter that RN will speak to granddaughter listed in the chart.   1833: RN spoke to granddaughter to get updates on patient's mobility, medication administration preferences and dietary preferences. Patient able to take meds whole, regular diet, 2+ assist. NT applied purwick for urine collection. Patient has scattered abrasions on legs, various toes, knees and arms. Scatter bruising on arms. RN noted 2 x stages 2 on right buttock.  RN relayed various skin concerns to oncoming nurse.

## 2022-06-20 NOTE — ED Notes (Signed)
ED TO INPATIENT HANDOFF REPORT  ED Nurse Name and Phone #: Brett Canales [phone]  S Name/Age/Gender Ann Cole 82 y.o. female Room/Bed: 005C/005C  Code Status   Code Status: Full Code  Home/SNF/Other Skilled nursing facility Patient oriented to: self Is this baseline? Yes   Triage Complete: Triage complete  Chief Complaint CHF (congestive heart failure) (HCC) [I50.9]  Triage Note From [facility]. Called EMS for concerns for swelling in lower extremities, decreased mental status, hx of dementia. Decreased mobility. Recently seen for fluid retention. "Grunting" with breathing   Allergies No Known Allergies  Level of Care/Admitting Diagnosis ED Disposition     ED Disposition  Admit   Condition  --   Comment  Hospital Area: [facility] [[id number]]  Level of Care: Telemetry Cardiac [103]  May admit patient to [facility] or [facility] if equivalent level of care is available:: No  Covid Evaluation: Asymptomatic - no recent exposure (last 10 days) testing not required  Diagnosis: CHF (congestive heart failure) [facility]) [[id number]]  Admitting Physician: Emeline General [[id number]]  Attending Physician: Emeline General [[id number]]  Certification:: I certify this patient will need inpatient services for at least 2 midnights  Estimated Length of Stay: 2          B Medical/Surgery History Past Medical History:  Diagnosis Date   Chronic diastolic heart failure (HCC)    Dementia (HCC)    Hyperlipidemia    Hypertension    Paroxysmal atrial fibrillation (HCC)    Thyroid disease    History reviewed. No pertinent surgical history.   A IV Location/Drains/Wounds Patient Lines/Drains/Airways Status     Active Line/Drains/Airways     Name Placement date Placement time Site Days   Peripheral IV 06/20/22 20 G Left;Posterior Hand 06/20/22  1523  Hand  less than 1            Intake/Output Last 24 hours No intake or output data in the 24 hours ending  06/20/22 1726  Labs/Imaging Results for orders placed or performed during the hospital encounter of 06/20/22 (from the past 48 hour(s))  CBG monitoring, ED     Status: Abnormal   Collection Time: 06/20/22 10:55 AM  Result Value Ref Range   Glucose-Capillary 104 (H) 70 - 99 mg/dL    Comment: Glucose reference range applies only to samples taken after fasting for at least 8 hours.  Brain natriuretic peptide     Status: Abnormal   Collection Time: 06/20/22 11:05 AM  Result Value Ref Range   B Natriuretic Peptide 327.4 (H) 0.0 - 100.0 pg/mL    Comment: Performed at [facility], [address] [place], [place] [address]  CBC with Differential     Status: Abnormal   Collection Time: 06/20/22 11:05 AM  Result Value Ref Range   WBC 9.9 4.0 - 10.5 K/uL   RBC 4.07 3.87 - 5.11 MIL/uL   Hemoglobin 11.8 (L) 12.0 - 15.0 g/dL   HCT 84.1 32.4 - 40.1 %   MCV 92.4 80.0 - 100.0 fL   MCH 29.0 26.0 - 34.0 pg   MCHC 31.4 30.0 - 36.0 g/dL   RDW 02.7 (H) 25.3 - 66.4 %   Platelets 287 150 - 400 K/uL   nRBC 0.0 0.0 - 0.2 %   Neutrophils Relative % 70 %   Neutro Abs 6.9 1.7 - 7.7 K/uL   Lymphocytes Relative 17 %   Lymphs Abs 1.7 0.7 - 4.0 K/uL   Monocytes Relative  11 %   Monocytes Absolute 1.1 (H) 0.1 - 1.0 K/uL   Eosinophils Relative 0 %   Eosinophils Absolute 0.0 0.0 - 0.5 K/uL   Basophils Relative 1 %   Basophils Absolute 0.1 0.0 - 0.1 K/uL   Immature Granulocytes 1 %   Abs Immature Granulocytes 0.07 0.00 - 0.07 K/uL    Comment: Performed at [facility], [address], [place], [place] [address]  Comprehensive metabolic panel     Status: Abnormal   Collection Time: 06/20/22 11:05 AM  Result Value Ref Range   Sodium 137 135 - 145 mmol/L   Potassium 4.2 3.5 - 5.1 mmol/L   Chloride 105 98 - 111 mmol/L   CO2 24 22 - 32 mmol/L   Glucose, Bld 106 (H) 70 - 99 mg/dL    Comment: Glucose reference range applies only to samples taken after fasting for at least 8 hours.   BUN 16 8 - 23  mg/dL   Creatinine, Ser 6.04 0.44 - 1.00 mg/dL   Calcium 8.9 8.9 - 54.0 mg/dL   Total Protein 6.8 6.5 - 8.1 g/dL   Albumin 3.1 (L) 3.5 - 5.0 g/dL   AST 31 15 - 41 U/L   ALT 34 0 - 44 U/L   Alkaline Phosphatase 121 38 - 126 U/L   Total Bilirubin 0.4 0.3 - 1.2 mg/dL   GFR, Estimated 56 (L) >60 mL/min    Comment: (NOTE) Calculated using the CKD-EPI Creatinine Equation (2021)    Anion gap 8 5 - 15    Comment: Performed at [facility], [address], [place], [place] [address]  SARS Coronavirus 2 by RT PCR (hospital order, performed in [facility] hospital lab) *cepheid single result test* Anterior Nasal Swab     Status: None   Collection Time: 06/20/22 11:26 AM   Specimen: Anterior Nasal Swab  Result Value Ref Range   SARS Coronavirus 2 by RT PCR NEGATIVE NEGATIVE    Comment: Performed at [facility], [address], [place], [place] [address]   DG Chest Port 1 View  Result Date: 06/20/2022 CLINICAL DATA:  Dyspnea EXAM: PORTABLE CHEST 1 VIEW COMPARISON:  Chest radiograph dated 06/07/2022 FINDINGS: Low lung volumes with bronchovascular crowding. Mild interstitial opacities. Bibasilar patchy opacities. No pleural effusion or pneumothorax. Similar enlarged cardiomediastinal silhouette. Similar deformity of the right humeral head. IMPRESSION: 1. Low lung volumes with bronchovascular crowding. Mild interstitial opacities, which may represent pulmonary edema. 2. Bibasilar patchy opacities, likely atelectasis. Electronically Signed   By: Agustin Cree M.D.   On: 06/20/2022 12:23    Pending Labs Unresulted Labs (From admission, onward)     Start     Ordered   06/21/22 0500  Basic metabolic panel  Daily,   R     Comments: As Scheduled for 5 days    06/20/22 1701            Vitals/Pain Today's Vitals   06/20/22 1207 06/20/22 1318 06/20/22 1425 06/20/22 1625  BP:  (!) 178/96 (!) 152/79   Pulse:  82 84   Resp:  (!) 25 (!) 24   Temp: (!) 97.4 F (36.3 C)   (!) 97.3 F (36.3  C)  TempSrc: Rectal   Axillary  SpO2:  93% 95%   Weight:      Height:      PainSc:        Isolation Precautions Airborne and Contact precautions  Medications Medications  acetaminophen (TYLENOL)  tablet 500 mg (has no administration in time range)  metoprolol succinate (TOPROL-XL) 24 hr tablet 75 mg (has no administration in time range)  memantine (NAMENDA) tablet 5 mg (has no administration in time range)  sertraline (ZOLOFT) tablet 50 mg (has no administration in time range)  levothyroxine (SYNTHROID) tablet 100 mcg (has no administration in time range)  alum & mag hydroxide-simeth (MAALOX/MYLANTA) 200-200-20 MG/5ML suspension 30 mL (has no administration in time range)  loperamide (IMODIUM) capsule 2 mg (has no administration in time range)  magnesium hydroxide (MILK OF MAGNESIA) suspension 30 mL (has no administration in time range)  melatonin tablet 5 mg (has no administration in time range)  guaiFENesin (ROBITUSSIN) 100 MG/5ML liquid 200 mg (has no administration in time range)  sodium chloride flush (NS) 0.9 % injection 3 mL (has no administration in time range)  sodium chloride flush (NS) 0.9 % injection 3 mL (has no administration in time range)  0.9 %  sodium chloride infusion (has no administration in time range)  acetaminophen (TYLENOL) tablet 650 mg (has no administration in time range)  ondansetron (ZOFRAN) injection 4 mg (has no administration in time range)  enoxaparin (LOVENOX) injection 40 mg (has no administration in time range)  furosemide (LASIX) injection 40 mg (has no administration in time range)  cefTRIAXone (ROCEPHIN) 1 g in sodium chloride 0.9 % 100 mL IVPB (has no administration in time range)  hydrALAZINE (APRESOLINE) injection 5 mg (has no administration in time range)  methylPREDNISolone sodium succinate (SOLU-MEDROL) 125 mg/2 mL injection 125 mg (125 mg Intravenous Given 06/20/22 1523)  albuterol (PROVENTIL) (2.5 MG/3ML) 0.083% nebulizer solution 5 mg (5  mg Nebulization Given 06/20/22 1527)  cefTRIAXone (ROCEPHIN) 1 g in sodium chloride 0.9 % 100 mL IVPB (0 g Intravenous Stopped 06/20/22 1559)  azithromycin (ZITHROMAX) 500 mg in sodium chloride 0.9 % 250 mL IVPB (500 mg Intravenous New Bag/Given 06/20/22 1559)  furosemide (LASIX) injection 40 mg (40 mg Intravenous Given 06/20/22 1600)    Mobility non-ambulatory     Focused Assessments Neuro Assessment Handoff:  Swallow screen pass?  Not a stroke patient Cardiac Rhythm: Atrial flutter       Neuro Assessment:   Neuro Checks:      Has TPA been given? No If patient is a Neuro Trauma and patient is going to OR before floor call report to 4N Charge nurse: [phone] or [phone]   R Recommendations: See Admitting Provider Note  Report given to:   Additional Notes: Patient is mainly spanish speaking, confused at baseline, interpreter was not helpful for assessment due to confusion.

## 2022-06-21 ENCOUNTER — Other Ambulatory Visit (HOSPITAL_COMMUNITY): Payer: Medicare Other

## 2022-06-21 ENCOUNTER — Inpatient Hospital Stay (HOSPITAL_COMMUNITY): Payer: Medicare Other

## 2022-06-21 DIAGNOSIS — I10 Essential (primary) hypertension: Secondary | ICD-10-CM

## 2022-06-21 DIAGNOSIS — E039 Hypothyroidism, unspecified: Secondary | ICD-10-CM

## 2022-06-21 DIAGNOSIS — I480 Paroxysmal atrial fibrillation: Secondary | ICD-10-CM | POA: Diagnosis not present

## 2022-06-21 DIAGNOSIS — I5033 Acute on chronic diastolic (congestive) heart failure: Secondary | ICD-10-CM | POA: Diagnosis not present

## 2022-06-21 DIAGNOSIS — E66813 Obesity, class 3: Secondary | ICD-10-CM

## 2022-06-21 LAB — BASIC METABOLIC PANEL
Anion gap: 13 (ref 5–15)
BUN: 16 mg/dL (ref 8–23)
CO2: 26 mmol/L (ref 22–32)
Calcium: 9.2 mg/dL (ref 8.9–10.3)
Chloride: 100 mmol/L (ref 98–111)
Creatinine, Ser: 1.08 mg/dL — ABNORMAL HIGH (ref 0.44–1.00)
GFR, Estimated: 51 mL/min — ABNORMAL LOW (ref >60)
Glucose, Bld: 156 mg/dL — ABNORMAL HIGH (ref 70–99)
Potassium: 3.9 mmol/L (ref 3.5–5.1)
Sodium: 139 mmol/L (ref 135–145)

## 2022-06-21 MED ORDER — LOSARTAN POTASSIUM 25 MG PO TABS
25.0000 mg | ORAL_TABLET | Freq: Every day | ORAL | Status: DC
  Administered 2022-06-21 – 2022-06-25 (×5): 25 mg via ORAL
  Filled 2022-06-21 (×5): qty 1

## 2022-06-21 MED ORDER — SPIRONOLACTONE 12.5 MG HALF TABLET
12.5000 mg | ORAL_TABLET | Freq: Every day | ORAL | Status: DC
  Administered 2022-06-21 – 2022-06-22 (×2): 12.5 mg via ORAL
  Filled 2022-06-21 (×2): qty 1

## 2022-06-21 NOTE — Assessment & Plan Note (Addendum)
Advanced dementia. Continue neuro checks per unit protocol.  On memantine and spironolactone.   Urine analysis with no pyuria, will discontinue antibiotic therapy for now will rule out urine infection.

## 2022-06-21 NOTE — Assessment & Plan Note (Signed)
Calculated BMI is 44.13

## 2022-06-21 NOTE — Progress Notes (Signed)
Pt pulled out PIV in right hand. Attempt to place new PIV unsuccessful. Pt continues to fidget and place hands on staff while doing bedside care. Will inform the oncoming RN.

## 2022-06-21 NOTE — Assessment & Plan Note (Signed)
Continue with levothyroxine  

## 2022-06-21 NOTE — Progress Notes (Signed)
Heart Failure Navigator Progress Note  Assessed for Heart & Vascular TOC clinic readiness.  Patient does not meet criteria due to EF 60-65%, per MD note patient has Advanced Dementia. .   Navigator will sign off at this time.   Rhae Hammock, BSN, Scientist, clinical (histocompatibility and immunogenetics) Only

## 2022-06-21 NOTE — Assessment & Plan Note (Signed)
Echocardiogram from 2020 with preserved LV systolic function.  Urine output is 2,800 cc Systolic blood pressure is 149 to 163 mmHg.   Plan to continue diuresis with IV furosemide 40 mg IV q12 hrs. Medical therapy with metoprolol 75 mg daily. Add spironolactone and ARB.

## 2022-06-21 NOTE — Hospital Course (Addendum)
Ann Cole was admitted to the hospital with the working diagnosis of heart failure exacerbation.   82 yo female with the past medical history of dementia, heart failure, hypertension, hypothyroidism, and paroxymal atrial fibrillation who presented from SNF with worsening edema and dyspnea. Apparently worsening symptoms for the last 7 days, limited history from patient due to cognitive impairment. On her initial physical examination her blood pressure was 170/90, HR 82, RR 25 and 02 saturation 95%, lungs with rales bilaterally and increased work of breathing, heart with S1 and S2 present, with systolic murmur, abdomen with no distention and positive lower extremity edema.   Urina analysis SG 1,011, negative protein, small leukocytes and negative hgb.   Chest radiograph with cardiomegaly with bilateral hilar vascular congestion, with bilateral interstitial infiltrates, more predominant at the lower lobes.

## 2022-06-21 NOTE — Evaluation (Signed)
Physical Therapy Evaluation Patient Details Name: Ann Cole MRN: [id number] DOB: 11/28/1940 Today's Date: 06/21/2022  History of Present Illness  Pt is an 82 year old female admitted on 06/20/22 from nursing home for evaluation of worsening of dyspnea and lower extremity swelling. Past medical history significant of advanced dementia, chronic HFpEF, PAF not on systemic anticoagulation, hypothyroidism, HTN, chronic normocytic anemia.  Clinical Impression  Pt presents with admitting diagnosis above. Eval today very limited as pt has advanced dementia, only understands spanish, and has expressive difficulties at baseline. Per RN, pt was able to ambulate up until 2 months ago however has recently gained 18 pounds of fluid and has not ambulated since. Today, pt required +2 Total A to sit EOB and rolling for pericare. Recommend pt return to SNF upon DC. PT will continue to follow.       Recommendations for follow up therapy are one component of a multi-disciplinary discharge planning process, led by the attending physician.  Recommendations may be updated based on patient status, additional functional criteria and insurance authorization.  Follow Up Recommendations Can patient physically be transported by private vehicle: No     Assistance Recommended at Discharge Frequent or constant Supervision/Assistance  Patient can return home with the following  Two people to help with walking and/or transfers;Two people to help with bathing/dressing/bathroom;Assistance with cooking/housework;Assistance with feeding;Direct supervision/assist for medications management;Direct supervision/assist for financial management;Assist for transportation;Help with stairs or ramp for entrance    Equipment Recommendations Other (comment) (Per accepting facility)  Recommendations for Other Services       Functional Status Assessment Patient has had a recent decline in their functional status and demonstrates the  ability to make significant improvements in function in a reasonable and predictable amount of time.     Precautions / Restrictions Precautions Precautions: Fall Restrictions Weight Bearing Restrictions: No      Mobility  Bed Mobility Overal bed mobility: Needs Assistance Bed Mobility: Supine to Sit, Sit to Supine, Rolling Rolling: +2 for physical assistance, Total assist   Supine to sit: +2 for physical assistance, Total assist Sit to supine: +2 for physical assistance, Total assist        Transfers                   General transfer comment: Pt unable    Ambulation/Gait               General Gait Details: Pt unable  Stairs            Wheelchair Mobility    Modified Rankin (Stroke Patients Only)       Balance Overall balance assessment: Needs assistance Sitting-balance support: Bilateral upper extremity supported, Feet supported Sitting balance-Leahy Scale: Poor Sitting balance - Comments: Pt with heavy posterior lean Postural control: Posterior lean                                   Pertinent Vitals/Pain Pain Assessment Pain Assessment: No/denies pain    Home Living Family/patient expects to be discharged to:: Assisted living                 Home Equipment: Other (comment) (Unable to determine) Additional Comments: Pt from memory care. Pt only understands spanish and mumbles at baseline. Unknown what equipment she has.    Prior Function Prior Level of Function : Patient poor historian/Family not available  Mobility Comments: Per RN pt used to walk 2 months ago however hasn't walked since. Pt reportedly gained 18 lbs of fluid since then. ADLs Comments: Unable to determine     Hand Dominance        Extremity/Trunk Assessment   Upper Extremity Assessment Upper Extremity Assessment: Generalized weakness    Lower Extremity Assessment Lower Extremity Assessment: Generalized weakness        Communication   Communication: Expressive difficulties;Prefers language other than English;Interpreter utilized Leanor Kail [phone])  Cognition Arousal/Alertness: Awake/alert Behavior During Therapy:  (pt mumbles or says 1 word phrases.) Overall Cognitive Status: History of cognitive impairments - at baseline                                 General Comments: Pt has advanced dementia at baseline. Per RN pt only understands spanish and only mumbles. Pt will occassionally say 1 word phrases or point.        General Comments General comments (skin integrity, edema, etc.): VSS on RA    Exercises     Assessment/Plan    PT Assessment Patient needs continued PT services  PT Problem List Decreased strength;Decreased range of motion;Decreased activity tolerance;Decreased balance;Decreased mobility;Decreased coordination;Decreased cognition;Decreased knowledge of use of DME;Decreased safety awareness;Decreased knowledge of precautions;Cardiopulmonary status limiting activity;Obesity       PT Treatment Interventions DME instruction;Gait training;Functional mobility training;Therapeutic exercise;Therapeutic activities;Neuromuscular re-education;Balance training;Cognitive remediation;Patient/family education    PT Goals (Current goals can be found in the Care Plan section)  Acute Rehab PT Goals PT Goal Formulation: Patient unable to participate in goal setting Time For Goal Achievement: 07/05/22 Potential to Achieve Goals: Poor    Frequency Min 1X/week     Co-evaluation               AM-PAC PT "6 Clicks" Mobility  Outcome Measure Help needed turning from your back to your side while in a flat bed without using bedrails?: Total Help needed moving from lying on your back to sitting on the side of a flat bed without using bedrails?: Total Help needed moving to and from a bed to a chair (including a wheelchair)?: Total Help needed standing up from a chair using your arms  (e.g., wheelchair or bedside chair)?: Total Help needed to walk in hospital room?: Total Help needed climbing 3-5 steps with a railing? : Total 6 Click Score: 6    End of Session   Activity Tolerance: Patient tolerated treatment well Patient left: in bed;with call bell/phone within reach;with bed alarm set;with nursing/sitter in room (Transport arriving to take patient to vascular lab) Nurse Communication: Mobility status PT Visit Diagnosis: Other abnormalities of gait and mobility (R26.89);Muscle weakness (generalized) (M62.81);Difficulty in walking, not elsewhere classified (R26.2)    Time: 3474-2595 PT Time Calculation (min) (ACUTE ONLY): 21 min   Charges:   PT Evaluation $PT Eval High Complexity: 1 High PT Treatments $Therapeutic Activity: 8-22 mins        Shela Nevin, PT, DPT Acute Rehab Services [phone]   Gladys Damme 06/21/2022, 4:13 PM

## 2022-06-21 NOTE — Progress Notes (Incomplete)
PROGRESS NOTE    Ann Cole  ZOX:[id number] DOB: 05-Oct-1940 DOA: 06/20/2022 PCP: Deeann Saint, MD   82/F  y.o. female with medical history significant of advanced dementia, chronic HFpEF, PAF not on systemic anticoagulation, hypothyroidism, HTN, chronic normocytic anemia, sent from nursing home for evaluation of worsening of dyspnea and lower extremity swelling.  Granddaughter reported that the patient was last seen normal last week. ED Course: Afebrile, VSS,  Chest x-ray showed cardiomegaly and mild pulmonary edema.  Blood work showed creatinine 1.0 K4.2 bicarb 24, hemoglobin 11.8.  Patient was given DuoNebs, IV ceftriaxone and IV azithromycin and IV Lasix.    Subjective: -Some confusion, communication limited by cognitive defects and language barrier  Assessment and Plan:  Acute on chronic HFpEF -Significant fluid overload of mild pulmonary edema and lower extremity edema, right> left -Continue Lasix 40 mg IV twice daily, add Aldactone -Echo with preserved EF, mildly reduced RV, severely elevated PA pressures -continue metoprolol, hydralazine -Poor candidate for SGLT2i with relative immobility and prior UTIs  Severe pulmonary hypertension -Check CTA chest to rule out acute/chronic PEs and eval for parenchymal lung disease -With dementia and poor functional status she is not a candidate for invasive workup   Hypothyroidism -Stable, continue Synthroid   PAF -Rate controlled, not on systemic anticoagulation -continue aspirin   Advanced dementia -Stable, at risk for delirium  Debility   DVT prophylaxis: lovenox Code Status: Full Code per family on admission yesterday Family Communication: No family at bedside will update granddaughter Disposition Plan: Back to SNF likely 1 to 2 days  Consultants:    Procedures:   Antimicrobials:    Objective: Vitals:   06/20/22 2116 06/21/22 0015 06/21/22 0405 06/21/22 1021  BP: (!) 168/102 (!) 146/75 (!) 149/77 (!)  163/82  Pulse:  82 83 83  Resp:  (!) 21 20 20   Temp:  97.8 F (36.6 C) 97.8 F (36.6 C) (!) 97.5 F (36.4 C)  TempSrc:  Oral Oral Axillary  SpO2:  95% 96% 94%  Weight:   102.5 kg   Height:        Intake/Output Summary (Last 24 hours) at 06/21/2022 1354 Last data filed at 06/21/2022 1314 Gross per 24 hour  Intake --  Output 3700 ml  Net -3700 ml   Filed Weights   06/20/22 1058 06/20/22 2005 06/21/22 0405  Weight: 104.3 kg 106.8 kg 102.5 kg    Examination:  Pleasant elderly female sitting up in bed, awake alert oriented to self only, moderate cognitive deficits HEENT: Neck obese unable to assess JVD CVS: S1-S2, regular rhythm Lungs: Distant breath sounds, decreased at the bases Abdomen: Soft, nontender, bowel sounds present Extremities: Trace edema    Data Reviewed:   CBC: Recent Labs  Lab 06/20/22 1105  WBC 9.9  NEUTROABS 6.9  HGB 11.8*  HCT 37.6  MCV 92.4  PLT 287   Basic Metabolic Panel: Recent Labs  Lab 06/20/22 1105 06/21/22 0132  NA 137 139  K 4.2 3.9  CL 105 100  CO2 24 26  GLUCOSE 106* 156*  BUN 16 16  CREATININE 1.00 1.08*  CALCIUM 8.9 9.2   GFR: Estimated Creatinine Clearance: 41.8 mL/min (A) (by C-G formula based on SCr of 1.08 mg/dL (H)). Liver Function Tests: Recent Labs  Lab 06/20/22 1105  AST 31  ALT 34  ALKPHOS 121  BILITOT 0.4  PROT 6.8  ALBUMIN 3.1*   No results for input(s): "LIPASE", "AMYLASE" in the last 168 hours. No results for input(s): "AMMONIA"  in the last 168 hours. Coagulation Profile: No results for input(s): "INR", "PROTIME" in the last 168 hours. Cardiac Enzymes: No results for input(s): "CKTOTAL", "CKMB", "CKMBINDEX", "TROPONINI" in the last 168 hours. BNP (last 3 results) No results for input(s): "PROBNP" in the last 8760 hours. HbA1C: No results for input(s): "HGBA1C" in the last 72 hours. CBG: Recent Labs  Lab 06/20/22 1055  GLUCAP 104*   Lipid Profile: No results for input(s): "CHOL", "HDL",  "LDLCALC", "TRIG", "CHOLHDL", "LDLDIRECT" in the last 72 hours. Thyroid Function Tests: No results for input(s): "TSH", "T4TOTAL", "FREET4", "T3FREE", "THYROIDAB" in the last 72 hours. Anemia Panel: No results for input(s): "VITAMINB12", "FOLATE", "FERRITIN", "TIBC", "IRON", "RETICCTPCT" in the last 72 hours. Urine analysis:    Component Value Date/Time   COLORURINE YELLOW 04/26/2021 1740   APPEARANCEUR HAZY (A) 04/26/2021 1740   LABSPEC 1.011 04/26/2021 1740   PHURINE 5.0 04/26/2021 1740   GLUCOSEU NEGATIVE 04/26/2021 1740   HGBUR SMALL (A) 04/26/2021 1740   BILIRUBINUR NEGATIVE 04/26/2021 1740   KETONESUR NEGATIVE 04/26/2021 1740   PROTEINUR NEGATIVE 04/26/2021 1740   NITRITE POSITIVE (A) 04/26/2021 1740   LEUKOCYTESUR SMALL (A) 04/26/2021 1740   Sepsis Labs: @LABRCNTIP (procalcitonin:4,lacticidven:4)  ) Recent Results (from the past 240 hour(s))  SARS Coronavirus 2 by RT PCR (hospital order, performed in [facility] hospital lab) *cepheid single result test* Anterior Nasal Swab     Status: None   Collection Time: 06/20/22 11:26 AM   Specimen: Anterior Nasal Swab  Result Value Ref Range Status   SARS Coronavirus 2 by RT PCR NEGATIVE NEGATIVE Final    Comment: Performed at [facility], [address] [place], [place] [address]     Radiology Studies: DG Chest Port 1 View  Result Date: 06/20/2022 CLINICAL DATA:  Dyspnea EXAM: PORTABLE CHEST 1 VIEW COMPARISON:  Chest radiograph dated 06/07/2022 FINDINGS: Low lung volumes with bronchovascular crowding. Mild interstitial opacities. Bibasilar patchy opacities. No pleural effusion or pneumothorax. Similar enlarged cardiomediastinal silhouette. Similar deformity of the right humeral head. IMPRESSION: 1. Low lung volumes with bronchovascular crowding. Mild interstitial opacities, which may represent pulmonary edema. 2. Bibasilar patchy opacities, likely atelectasis. Electronically Signed   By: Agustin Cree M.D.   On: 06/20/2022 12:23      Scheduled Meds:  acetaminophen  500 mg Oral Q8H   aspirin EC  81 mg Oral Daily   enoxaparin (LOVENOX) injection  40 mg Subcutaneous Q24H   furosemide  40 mg Intravenous Q12H   levothyroxine  100 mcg Oral Daily   melatonin  5 mg Oral QHS   memantine  5 mg Oral BID   metoprolol succinate  75 mg Oral Daily   sertraline  50 mg Oral q morning   sodium chloride flush  3 mL Intravenous Q12H   Continuous Infusions:  sodium chloride     cefTRIAXone (ROCEPHIN)  IV 1 g (06/21/22 1310)     LOS: 1 day    Time spent:    Zannie Cove, MD [facility]   06/21/2022, 1:54 PM

## 2022-06-21 NOTE — Assessment & Plan Note (Signed)
Continue blood pressure control with metoprolol, add losartan. Continue diuresis with furosemide and add spironolactone.

## 2022-06-21 NOTE — Assessment & Plan Note (Signed)
Continue rate control with metoprolol. Not on anticoagulation.

## 2022-06-21 NOTE — Progress Notes (Signed)
  Progress Note   Patient: Ann Cole ZOX:[id number] DOB: 08-Oct-1940 DOA: 06/20/2022     1 DOS: the patient was seen and examined on 06/21/2022   Brief hospital course: Mrs. Ann Cole was admitted to the hospital with the working diagnosis of heart failure exacerbation.   82 yo female with the past medical history of dementia, heart failure, hypertension, hypothyroidism, and paroxymal atrial fibrillation who presented from SNF with worsening edema and dyspnea. Apparently worsening symptoms for the last 7 days, limited history from patient due to cognitive impairment. On her initial physical examination her blood pressure was 170/90, HR 82, RR 25 and 02 saturation 95%, lungs with rales bilaterally and increased work of breathing, heart with S1 and S2 present, with systolic murmur, abdomen with no distention and positive lower extremity edema.   Urina analysis SG 1,011, negative protein, small leukocytes and negative hgb.   Chest radiograph with cardiomegaly with bilateral hilar vascular congestion, with bilateral interstitial infiltrates, more predominant at the lower lobes.    Assessment and Plan: * Acute on chronic diastolic CHF (congestive heart failure) (HCC) Echocardiogram from 2020 with preserved LV systolic function.  Urine output is 2,800 cc Systolic blood pressure is 149 to 163 mmHg.   Plan to continue diuresis with IV furosemide 40 mg IV q12 hrs. Medical therapy with metoprolol 75 mg daily. Add spironolactone and ARB.   Essential hypertension Continue blood pressure control with metoprolol, add losartan. Continue diuresis with furosemide and add spironolactone.   Paroxysmal atrial fibrillation (HCC) Continue rate control with metoprolol. Not on anticoagulation.   Dementia (HCC) Advanced dementia. Continue neuro checks per unit protocol.  On memantine and spironolactone.   Urine analysis with no pyuria, will discontinue antibiotic therapy for now will rule out urine  infection.   Class 3 obesity (HCC) Calculated BMI is 44.13   Hypothyroidism Continue with levothyroxine.         Subjective: Patient with no chest pain or dyspnea, limited information due to cognitive impairment.   Physical Exam: Vitals:   06/20/22 2116 06/21/22 0015 06/21/22 0405 06/21/22 1021  BP: (!) 168/102 (!) 146/75 (!) 149/77 (!) 163/82  Pulse:  82 83 83  Resp:  (!) 21 20 20   Temp:  97.8 F (36.6 C) 97.8 F (36.6 C) (!) 97.5 F (36.4 C)  TempSrc:  Oral Oral Axillary  SpO2:  95% 96% 94%  Weight:   102.5 kg   Height:       Neurology awake and alert. Responds to simple questions and follows simple commands ENT with mild pallor Cardiovascular with S1 and S2 present, irregularly irregular, with no gallops, rubs or murmurs Mild JVD Positive lower extremity edema +  Respiratory with no rales or wheezing, no rhonchi Abdomen with no distention  Data Reviewed:    Family Communication: no family at the bedside   Disposition: Status is: Inpatient Remains inpatient appropriate because: IV diuresis   Planned Discharge Destination: Skilled nursing facility    Author: Coralie Keens, MD 06/21/2022 4:53 PM  For on call review [email or web address].

## 2022-06-22 ENCOUNTER — Inpatient Hospital Stay (HOSPITAL_COMMUNITY): Payer: Medicare Other

## 2022-06-22 DIAGNOSIS — I5031 Acute diastolic (congestive) heart failure: Secondary | ICD-10-CM

## 2022-06-22 DIAGNOSIS — M7989 Other specified soft tissue disorders: Secondary | ICD-10-CM | POA: Diagnosis not present

## 2022-06-22 DIAGNOSIS — I5033 Acute on chronic diastolic (congestive) heart failure: Secondary | ICD-10-CM | POA: Diagnosis not present

## 2022-06-22 LAB — ECHOCARDIOGRAM COMPLETE
AR max vel: 0.76 cm2
AV Area VTI: 0.82 cm2
AV Area mean vel: 0.76 cm2
AV Mean grad: 13 mmHg
AV Peak grad: 24.4 mmHg
Ao pk vel: 2.47 m/s
Height: 60 in
MV M vel: 5.63 m/s
MV Peak grad: 126.8 mmHg
P 1/2 time: 328 msec
S' Lateral: 2.9 cm
Weight: 3467.39 oz

## 2022-06-22 LAB — BASIC METABOLIC PANEL
Anion gap: 16 — ABNORMAL HIGH (ref 5–15)
BUN: 20 mg/dL (ref 8–23)
CO2: 25 mmol/L (ref 22–32)
Calcium: 9.1 mg/dL (ref 8.9–10.3)
Chloride: 95 mmol/L — ABNORMAL LOW (ref 98–111)
Creatinine, Ser: 1.05 mg/dL — ABNORMAL HIGH (ref 0.44–1.00)
GFR, Estimated: 52 mL/min — ABNORMAL LOW (ref >60)
Glucose, Bld: 105 mg/dL — ABNORMAL HIGH (ref 70–99)
Potassium: 3.5 mmol/L (ref 3.5–5.1)
Sodium: 136 mmol/L (ref 135–145)

## 2022-06-22 LAB — MAGNESIUM: Magnesium: 1.9 mg/dL (ref 1.7–2.4)

## 2022-06-22 MED ORDER — IOHEXOL 350 MG/ML SOLN
75.0000 mL | Freq: Once | INTRAVENOUS | Status: AC | PRN
  Administered 2022-06-22: 75 mL via INTRAVENOUS

## 2022-06-22 MED ORDER — SPIRONOLACTONE 25 MG PO TABS
25.0000 mg | ORAL_TABLET | Freq: Every day | ORAL | Status: DC
  Administered 2022-06-23 – 2022-06-24 (×2): 25 mg via ORAL
  Filled 2022-06-22 (×3): qty 1

## 2022-06-22 NOTE — Progress Notes (Signed)
Lower extremity venous right study completed.   Please see CV Proc for preliminary results.   Pennye Beeghly, RDMS, RVT  

## 2022-06-22 NOTE — TOC Initial Note (Signed)
Transition of Care [facility]) - Initial/Assessment Note    Patient Details  Name: Ann Cole MRN: [id number] Date of Birth: August 20, 1940  Transition of Care [facility]) CM/SW Contact:    Leander Rams, LCSW Phone Number: 06/22/2022, 9:53 AM  Clinical Narrative:                 CSW spoke with pt granddaughter [name] regarding pt disposition. [name] reports that pt is in memory care at [facility] and has been there for about 4 years. Granddaughter express that she would like for her to return there with therapy. Pt was receiving therapy for her shoulder.   CSW called [facility] to inquire about pt receiving PT when dc back to facility. No one answered and a VM was left.   TOC will continue to follow.   Expected Discharge Plan: Skilled Nursing Facility Barriers to Discharge: Continued Medical Work up   Patient Goals and CMS Choice Patient states their goals for this hospitalization and ongoing recovery are:: Granddaughter [name] would like for pt to return to [facility]          Expected Discharge Plan and Services       Living arrangements for the past 2 months: Assisted Living Facility                                      Prior Living Arrangements/Services Living arrangements for the past 2 months: Assisted Living Facility Lives with:: Self   Do you feel safe going back to the place where you live?: Yes (Granddaughter is comfrotable with her going back to [facility])      Need for Family Participation in Patient Care: Yes (Comment) Care giver support system in place?: Yes (comment) Current home services: DME Criminal Activity/Legal Involvement Pertinent to Current Situation/Hospitalization: No - Comment as needed  Activities of Daily Living      Permission Sought/Granted                  Emotional Assessment Appearance:: Other (Comment Required (Assessment completed with granddaughter) Attitude/Demeanor/Rapport: Unable to Assess Affect  (typically observed): Unable to Assess Orientation: : Oriented to Self Alcohol / Substance Use: Not Applicable Psych Involvement: No (comment)  Admission diagnosis:  CHF (congestive heart failure) (HCC) [I50.9] Patient Active Problem List   Diagnosis Date Noted   Class 3 obesity (HCC) 06/21/2022   CHF (congestive heart failure) (HCC) 06/20/2022   Acute cystitis 04/27/2021   Acute on chronic anemia 04/27/2021   Humeral head fracture, right, closed, initial encounter 04/27/2021   Hypothyroidism    Paroxysmal atrial fibrillation (HCC)    Acute on chronic diastolic CHF (congestive heart failure) (HCC)    CAP (community acquired pneumonia) 04/26/2021   Syncope 08/23/2018   COVID-19 virus infection 08/23/2018   Dementia (HCC) 01/05/2018   Essential hypertension 01/05/2018   PCP:  Deeann Saint, MD Pharmacy:  No Pharmacies Listed    Social Determinants of Health (SDOH) Social History: SDOH Screenings   Tobacco Use: Low Risk  (06/20/2022)   SDOH Interventions:     Readmission Risk Interventions     No data to display         Oletta Lamas, MSW, LCSWA, LCASA Transitions of Care  Clinical Social Worker I

## 2022-06-22 NOTE — Progress Notes (Signed)
  Echocardiogram 2D Echocardiogram has been performed.  Delcie Roch 06/22/2022, 10:31 AM

## 2022-06-23 DIAGNOSIS — I5033 Acute on chronic diastolic (congestive) heart failure: Secondary | ICD-10-CM | POA: Diagnosis not present

## 2022-06-23 LAB — CBC
HCT: 41.9 % (ref 36.0–46.0)
Hemoglobin: 13.4 g/dL (ref 12.0–15.0)
MCH: 29 pg (ref 26.0–34.0)
MCHC: 32 g/dL (ref 30.0–36.0)
MCV: 90.7 fL (ref 80.0–100.0)
Platelets: 281 10*3/uL (ref 150–400)
RBC: 4.62 MIL/uL (ref 3.87–5.11)
RDW: 16.9 % — ABNORMAL HIGH (ref 11.5–15.5)
WBC: 12.3 10*3/uL — ABNORMAL HIGH (ref 4.0–10.5)
nRBC: 0 % (ref 0.0–0.2)

## 2022-06-23 LAB — BASIC METABOLIC PANEL
Anion gap: 15 (ref 5–15)
BUN: 25 mg/dL — ABNORMAL HIGH (ref 8–23)
CO2: 27 mmol/L (ref 22–32)
Calcium: 9.3 mg/dL (ref 8.9–10.3)
Chloride: 93 mmol/L — ABNORMAL LOW (ref 98–111)
Creatinine, Ser: 1.23 mg/dL — ABNORMAL HIGH (ref 0.44–1.00)
GFR, Estimated: 43 mL/min — ABNORMAL LOW (ref >60)
Glucose, Bld: 83 mg/dL (ref 70–99)
Potassium: 3.5 mmol/L (ref 3.5–5.1)
Sodium: 135 mmol/L (ref 135–145)

## 2022-06-23 MED ORDER — POTASSIUM CHLORIDE CRYS ER 20 MEQ PO TBCR
40.0000 meq | EXTENDED_RELEASE_TABLET | Freq: Two times a day (BID) | ORAL | Status: AC
  Administered 2022-06-23 (×2): 40 meq via ORAL
  Filled 2022-06-23 (×2): qty 2

## 2022-06-23 NOTE — Care Management Important Message (Signed)
Important Message  Patient Details  Name: Ann Cole MRN: [id number] Date of Birth: [birth year]   Medicare Important Message Given:  Yes     Renie Ora 06/23/2022, 9:29 AM

## 2022-06-23 NOTE — Progress Notes (Signed)
PROGRESS NOTE    Bridgett Lecy  ZOX:[id number] DOB: 1940-07-09 DOA: 06/20/2022 PCP: Deeann Saint, MD   82/F  y.o. female with medical history significant of advanced dementia, chronic HFpEF, PAF not on systemic anticoagulation, hypothyroidism, HTN, chronic normocytic anemia, sent from nursing home for evaluation of worsening of dyspnea and lower extremity swelling.  Granddaughter reported that the patient was last seen normal last week. ED Course: Afebrile, VSS,  Chest x-ray showed cardiomegaly and mild pulmonary edema.  Blood work showed creatinine 1.0 K4.2 bicarb 24, hemoglobin 11.8.  Patient was given DuoNebs, IV ceftriaxone and IV azithromycin and IV Lasix.    Subjective: -Feels better, breathing is improving, communication significantly limited by cognitive deficits and language barrier despite using interpreter  Assessment and Plan:  Acute on chronic HFpEF -Diuresing well 6.7 L negative, weight down 15 LB  -Continue IV Lasix 1 more day, continue Aldactone and losartan  -Echo with preserved EF, mildly reduced RV, severely elevated PA pressures -continue metoprolol, hydralazine -Poor candidate for SGLT2i with relative immobility and prior UTIs -Will discuss with family regarding palliative care follow-up  Severe pulmonary hypertension -CTA chest negative for PE, moderate right pleural effusion noted, continue diuretics as above a -With dementia and poor functional status she is not a candidate for invasive workup -Discussed palliative care with daughter, will send request at follow-up   Hypothyroidism -Stable, continue Synthroid   PAF -Rate controlled, not on systemic anticoagulation -continue aspirin   Advanced dementia -Stable, at risk for delirium  Debility   DVT prophylaxis: lovenox Code Status: Full Code per family Family Communication: No family at bedside, called and updated granddaughter Disposition Plan: Back to SNF likely 1 to 2 days  Consultants:     Procedures:   Antimicrobials:    Objective: Vitals:   06/22/22 1955 06/23/22 0026 06/23/22 0419 06/23/22 0500  BP: 111/86 117/84 (!) 149/80   Pulse: 84 83 80   Resp: 18 18 18    Temp: (!) 97.5 F (36.4 C) 97.6 F (36.4 C) 97.6 F (36.4 C)   TempSrc: Axillary Axillary Axillary   SpO2: 96%  94%   Weight:  96.8 kg  95.1 kg  Height:        Intake/Output Summary (Last 24 hours) at 06/23/2022 1106 Last data filed at 06/23/2022 1017 Gross per 24 hour  Intake 480 ml  Output 2725 ml  Net -2245 ml   Filed Weights   06/22/22 0356 06/23/22 0026 06/23/22 0500  Weight: 98.3 kg 96.8 kg 95.1 kg    Examination:  Obese pleasant female sitting up in bed, awake alert oriented to self only, moderate cognitive deficits CVS: S1-S2, regular rhythm Lungs: Decreased breath sounds at the bases Abdomen: Soft, nontender, bowel sounds present Remedies: Trace edema     Data Reviewed:   CBC: Recent Labs  Lab 06/20/22 1105 06/23/22 0155  WBC 9.9 12.3*  NEUTROABS 6.9  --   HGB 11.8* 13.4  HCT 37.6 41.9  MCV 92.4 90.7  PLT 287 281   Basic Metabolic Panel: Recent Labs  Lab 06/20/22 1105 06/21/22 0132 06/22/22 0104 06/23/22 0155  NA 137 139 136 135  K 4.2 3.9 3.5 3.5  CL 105 100 95* 93*  CO2 24 26 25 27   GLUCOSE 106* 156* 105* 83  BUN 16 16 20  25*  CREATININE 1.00 1.08* 1.05* 1.23*  CALCIUM 8.9 9.2 9.1 9.3  MG  --   --  1.9  --    GFR: Estimated Creatinine Clearance: 35.1 mL/min (  A) (by C-G formula based on SCr of 1.23 mg/dL (H)). Liver Function Tests: Recent Labs  Lab 06/20/22 1105  AST 31  ALT 34  ALKPHOS 121  BILITOT 0.4  PROT 6.8  ALBUMIN 3.1*   No results for input(s): "LIPASE", "AMYLASE" in the last 168 hours. No results for input(s): "AMMONIA" in the last 168 hours. Coagulation Profile: No results for input(s): "INR", "PROTIME" in the last 168 hours. Cardiac Enzymes: No results for input(s): "CKTOTAL", "CKMB", "CKMBINDEX", "TROPONINI" in the last 168  hours. BNP (last 3 results) No results for input(s): "PROBNP" in the last 8760 hours. HbA1C: No results for input(s): "HGBA1C" in the last 72 hours. CBG: Recent Labs  Lab 06/20/22 1055  GLUCAP 104*   Lipid Profile: No results for input(s): "CHOL", "HDL", "LDLCALC", "TRIG", "CHOLHDL", "LDLDIRECT" in the last 72 hours. Thyroid Function Tests: No results for input(s): "TSH", "T4TOTAL", "FREET4", "T3FREE", "THYROIDAB" in the last 72 hours. Anemia Panel: No results for input(s): "VITAMINB12", "FOLATE", "FERRITIN", "TIBC", "IRON", "RETICCTPCT" in the last 72 hours. Urine analysis:    Component Value Date/Time   COLORURINE YELLOW 04/26/2021 1740   APPEARANCEUR HAZY (A) 04/26/2021 1740   LABSPEC 1.011 04/26/2021 1740   PHURINE 5.0 04/26/2021 1740   GLUCOSEU NEGATIVE 04/26/2021 1740   HGBUR SMALL (A) 04/26/2021 1740   BILIRUBINUR NEGATIVE 04/26/2021 1740   KETONESUR NEGATIVE 04/26/2021 1740   PROTEINUR NEGATIVE 04/26/2021 1740   NITRITE POSITIVE (A) 04/26/2021 1740   LEUKOCYTESUR SMALL (A) 04/26/2021 1740   Sepsis Labs: @LABRCNTIP (procalcitonin:4,lacticidven:4)  ) Recent Results (from the past 240 hour(s))  SARS Coronavirus 2 by RT PCR (hospital order, performed in [facility] hospital lab) *cepheid single result test* Anterior Nasal Swab     Status: None   Collection Time: 06/20/22 11:26 AM   Specimen: Anterior Nasal Swab  Result Value Ref Range Status   SARS Coronavirus 2 by RT PCR NEGATIVE NEGATIVE Final    Comment: Performed at [facility], [address], [place], [place] [address]     Radiology Studies: VAS [place] LOWER EXTREMITY VENOUS (DVT)  Result Date: 06/22/2022  Lower Venous DVT Study Patient Name:  QURAN HOBDAY  Date of Exam:   06/22/2022 Medical Rec #: [id number]              Accession #:    [id number] Date of Birth: 09-Apr-1940              Patient Gender: F Patient Age:   82 years Exam Location:  [facility] Procedure:      VAS [place] LOWER EXTREMITY  VENOUS (DVT) Referring Phys: Mikey College --------------------------------------------------------------------------------  Limitations: Altered mental status, patient pain and constant movement, repeated unsolicited valsalva maneuvers, guarding. Comparison Study: 06-07-2022 Prior bilateral lower extremity venous study was                   negative for DVT. Performing Technologist: Jean Rosenthal RDMS, RVT  Examination Guidelines: A complete evaluation includes B-mode imaging, spectral Doppler, color Doppler, and power Doppler as needed of all accessible portions of each vessel. Bilateral testing is considered an integral part of a complete examination. Limited examinations for reoccurring indications may be performed as noted. The reflux portion of the exam is performed with the patient in reverse Trendelenburg.  +---------+---------------+---------+-----------+----------+-------------------+ RIGHT    CompressibilityPhasicitySpontaneityPropertiesThrombus Aging      +---------+---------------+---------+-----------+----------+-------------------+ CFV      Full           Yes  Yes                                      +---------+---------------+---------+-----------+----------+-------------------+ SFJ      Full                                                             +---------+---------------+---------+-----------+----------+-------------------+ FV Prox  Full                                                             +---------+---------------+---------+-----------+----------+-------------------+ FV Mid                  Yes      Yes                  Patent by color     +---------+---------------+---------+-----------+----------+-------------------+ FV Distal                                             Unable to visualize                                                       -see limitations     +---------+---------------+---------+-----------+----------+-------------------+ PFV      Full                                                             +---------+---------------+---------+-----------+----------+-------------------+ POP      Full           Yes      Yes                                      +---------+---------------+---------+-----------+----------+-------------------+ PTV      Full                                                             +---------+---------------+---------+-----------+----------+-------------------+ PERO     Full                                                             +---------+---------------+---------+-----------+----------+-------------------+   +----+---------------+---------+-----------+----------+--------------+  LEFTCompressibilityPhasicitySpontaneityPropertiesThrombus Aging +----+---------------+---------+-----------+----------+--------------+ CFV Full           Yes      Yes                                 +----+---------------+---------+-----------+----------+--------------+     Summary: RIGHT: - There is no evidence of deep vein thrombosis in the lower extremity. However, portions of this examination were limited- see technologist comments above.  - No cystic structure found in the popliteal fossa.  LEFT: - No evidence of common femoral vein obstruction.  *See table(s) above for measurements and observations. Electronically signed by Heath Lark on 06/22/2022 at 6:07:22 PM.    Final    CT Angio Chest Pulmonary Embolism (PE) W or WO Contrast  Result Date: 06/22/2022 CLINICAL DATA:  Dyspnea, suspected pulmonary embolism. EXAM: CT ANGIOGRAPHY CHEST WITH CONTRAST TECHNIQUE: Multidetector CT imaging of the chest was performed using the standard protocol during bolus administration of intravenous contrast. Multiplanar CT image reconstructions and MIPs were obtained to evaluate the vascular anatomy. RADIATION DOSE REDUCTION:  This exam was performed according to the departmental dose-optimization program which includes automated exposure control, adjustment of the mA and/or kV according to patient size and/or use of iterative reconstruction technique. CONTRAST:  75mL OMNIPAQUE IOHEXOL 350 MG/ML SOLN COMPARISON:  August 23, 2018. FINDINGS: Cardiovascular: Satisfactory opacification of the pulmonary arteries to the segmental level. No evidence of pulmonary embolism. Mild cardiomegaly. No pericardial effusion. Mediastinum/Nodes: No enlarged mediastinal, hilar, or axillary lymph nodes. Thyroid gland, trachea, and esophagus demonstrate no significant findings. Lungs/Pleura: No pneumothorax is noted. Moderate size right pleural effusion is noted with adjacent subsegmental atelectasis of the right lower lobe. Left lung is unremarkable. Upper Abdomen: No acute abnormality. Musculoskeletal: No chest wall abnormality. No acute or significant osseous findings. Review of the MIP images confirms the above findings. IMPRESSION: No definite evidence of pulmonary embolus. Moderate right pleural effusion is noted with adjacent subsegmental atelectasis of right lower lobe. Aortic Atherosclerosis (ICD10-I70.0). Electronically Signed   By: Lupita Raider M.D.   On: 06/22/2022 15:52   ECHOCARDIOGRAM COMPLETE  Result Date: 06/22/2022    ECHOCARDIOGRAM REPORT   Patient Name:   Nivea MATILDE Korb Date of Exam: 06/22/2022 Medical Rec #:  [id number]             Height:       60.0 in Accession #:    [id number]            Weight:       216.7 lb Date of Birth:  [birth year]             BSA:          1.931 m Patient Age:    82 years              BP:           134/71 mmHg Patient Gender: F                     HR:           85 bpm. Exam Location:  Inpatient Procedure: 2D Echo, Cardiac Doppler and Color Doppler Indications:    acute diastolic chf  History:        Patient has prior history of Echocardiogram examinations, most                 recent 08/24/2018. CHF,  Arrythmias:Atrial Fibrillation,  Signs/Symptoms:Edema; Risk Factors:Hypertension and                 Dyslipidemia.  Sonographer:    Delcie Roch RDCS Referring Phys: [id number] [facility]  Sonographer Comments: Image acquisition challenging due to uncooperative patient, Image acquisition challenging due to patient body habitus and Dementia. Spanish speaking. IMPRESSIONS  1. Left ventricular ejection fraction, by estimation, is 60 to 65%. The left ventricle has normal function. The left ventricle has no regional wall motion abnormalities. Left ventricular diastolic function could not be evaluated.  2. Right ventricular systolic function is mildly reduced. The right ventricular size is normal. There is severely elevated pulmonary artery systolic pressure. The estimated right ventricular systolic pressure is 67.0 mmHg.  3. Left atrial size was moderately dilated.  4. Right atrial size was mild to moderately dilated.  5. The mitral valve is degenerative. Trivial mitral valve regurgitation. No evidence of mitral stenosis.  6. Tricuspid valve regurgitation is mild to moderate.  7. The aortic valve is normal in structure. Aortic valve regurgitation is mild to moderate. Moderate aortic valve stenosis. Aortic regurgitation PHT measures 328 msec. Aortic valve area, by VTI measures 0.82 cm. Aortic valve mean gradient measures 13.0  mmHg. Aortic valve Vmax measures 2.47 m/s. Although the mean AVG and Vmax are in the mild range for AS, the SVI is low at 20 and DVI is 0.32. 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 The inferior vena cava is dilated in size with >50% respiratory variability, suggesting right atrial pressure of 8 mmHg. FINDINGS  Left Ventricle: Left ventricular ejection fraction, by estimation, is 60 to 65%. The left ventricle has normal function. The left ventricle has no regional wall motion abnormalities. The left ventricular internal cavity size was normal in  size. There is  no left ventricular hypertrophy. Left ventricular diastolic function could not be evaluated. Right Ventricle: The right ventricular size is normal. No increase in right ventricular wall thickness. Right ventricular systolic function is mildly reduced. There is severely elevated pulmonary artery systolic pressure. The tricuspid regurgitant velocity is 3.84 m/s, and with an assumed right atrial pressure of 8 mmHg, the estimated right ventricular systolic pressure is 67.0 mmHg. Left Atrium: Left atrial size was moderately dilated. Right Atrium: Right atrial size was mild to moderately dilated. Pericardium: There is no evidence of pericardial effusion. Mitral Valve: The mitral valve is degenerative in appearance. There is mild calcification of the mitral valve leaflet(s). Mild mitral annular calcification. Trivial mitral valve regurgitation. No evidence of mitral valve stenosis. Tricuspid Valve: The tricuspid valve is normal in structure. Tricuspid valve regurgitation is mild to moderate. No evidence of tricuspid stenosis. Aortic Valve: The aortic valve is normal in structure. Aortic valve regurgitation is mild to moderate. Aortic regurgitation PHT measures 328 msec. Moderate aortic stenosis is present. Aortic valve mean gradient measures 13.0 mmHg. Aortic valve peak gradient measures 24.4 mmHg. Aortic valve area, by VTI measures 0.82 cm. Pulmonic Valve: The pulmonic valve was normal in structure. Pulmonic valve regurgitation is not visualized. No evidence of pulmonic stenosis. Aorta: The aortic root is normal in size and structure. Venous: The inferior vena cava is dilated in size with greater than 50% respiratory variability, suggesting right atrial pressure of 8 mmHg. IAS/Shunts: No atrial level shunt detected by color flow Doppler.  LEFT VENTRICLE PLAX 2D LVIDd:         4.00 cm LVIDs:         2.90 cm LV PW:  1.00 cm LV IVS:        0.90 cm LVOT diam:     1.80 cm LV SV:         38 LV SV Index:    20 LVOT Area:     2.54 cm  RIGHT VENTRICLE            IVC RV S prime:     9.14 cm/s  IVC diam: 2.30 cm TAPSE (M-mode): 1.5 cm LEFT ATRIUM             Index        RIGHT ATRIUM           Index LA diam:        3.40 cm 1.76 cm/m   RA Area:     20.20 cm LA Vol (A2C):   89.6 ml 46.39 ml/m  RA Volume:   60.40 ml  31.27 ml/m LA Vol (A4C):   76.4 ml 39.56 ml/m LA Biplane Vol: 82.6 ml 42.77 ml/m  AORTIC VALVE AV Area (Vmax):    0.76 cm AV Area (Vmean):   0.76 cm AV Area (VTI):     0.82 cm AV Vmax:           247.00 cm/s AV Vmean:          167.000 cm/s AV VTI:            0.462 m AV Peak Grad:      24.4 mmHg AV Mean Grad:      13.0 mmHg LVOT Vmax:         73.60 cm/s LVOT Vmean:        49.850 cm/s LVOT VTI:          0.149 m LVOT/AV VTI ratio: 0.32 AI PHT:            328 msec  AORTA Ao Root diam: 2.60 cm Ao Asc diam:  2.80 cm MR Peak grad: 126.8 mmHg  TRICUSPID VALVE MR Mean grad: 75.0 mmHg   TR Peak grad:   59.0 mmHg MR Vmax:      563.00 cm/s TR Vmax:        384.00 cm/s MR Vmean:     404.0 cm/s                           SHUNTS                           Systemic VTI:  0.15 m                           Systemic Diam: 1.80 cm Armanda Magic MD Electronically signed by Armanda Magic MD Signature Date/Time: 06/22/2022/11:13:58 AM    Final      Scheduled Meds:  acetaminophen  500 mg Oral Q8H   aspirin EC  81 mg Oral Daily   enoxaparin (LOVENOX) injection  40 mg Subcutaneous Q24H   furosemide  40 mg Intravenous Q12H   levothyroxine  100 mcg Oral Daily   losartan  25 mg Oral Daily   melatonin  5 mg Oral QHS   memantine  5 mg Oral BID   metoprolol succinate  75 mg Oral Daily   potassium chloride  40 mEq Oral BID   sertraline  50 mg Oral q morning   sodium chloride flush  3 mL Intravenous Q12H   spironolactone  25  mg Oral Daily   Continuous Infusions:  sodium chloride       LOS: 3 days    Time spent:    Zannie Cove, MD [facility]   06/23/2022, 11:06 AM

## 2022-06-24 DIAGNOSIS — I5033 Acute on chronic diastolic (congestive) heart failure: Secondary | ICD-10-CM | POA: Diagnosis not present

## 2022-06-24 LAB — BASIC METABOLIC PANEL
Anion gap: 13 (ref 5–15)
BUN: 26 mg/dL — ABNORMAL HIGH (ref 8–23)
CO2: 30 mmol/L (ref 22–32)
Calcium: 9.5 mg/dL (ref 8.9–10.3)
Chloride: 96 mmol/L — ABNORMAL LOW (ref 98–111)
Creatinine, Ser: 1.19 mg/dL — ABNORMAL HIGH (ref 0.44–1.00)
GFR, Estimated: 45 mL/min — ABNORMAL LOW (ref >60)
Glucose, Bld: 111 mg/dL — ABNORMAL HIGH (ref 70–99)
Potassium: 3.8 mmol/L (ref 3.5–5.1)
Sodium: 139 mmol/L (ref 135–145)

## 2022-06-24 LAB — CBC
HCT: 44.7 % (ref 36.0–46.0)
Hemoglobin: 14.6 g/dL (ref 12.0–15.0)
MCH: 29.1 pg (ref 26.0–34.0)
MCHC: 32.7 g/dL (ref 30.0–36.0)
MCV: 89 fL (ref 80.0–100.0)
Platelets: 337 10*3/uL (ref 150–400)
RBC: 5.02 MIL/uL (ref 3.87–5.11)
RDW: 16.5 % — ABNORMAL HIGH (ref 11.5–15.5)
WBC: 12.6 10*3/uL — ABNORMAL HIGH (ref 4.0–10.5)
nRBC: 0 % (ref 0.0–0.2)

## 2022-06-24 MED ORDER — POTASSIUM CHLORIDE CRYS ER 20 MEQ PO TBCR
40.0000 meq | EXTENDED_RELEASE_TABLET | Freq: Once | ORAL | Status: AC
  Administered 2022-06-24: 40 meq via ORAL
  Filled 2022-06-24: qty 2

## 2022-06-24 MED ORDER — FUROSEMIDE 40 MG PO TABS
40.0000 mg | ORAL_TABLET | Freq: Two times a day (BID) | ORAL | Status: DC
  Administered 2022-06-24 (×2): 40 mg via ORAL
  Filled 2022-06-24 (×3): qty 1

## 2022-06-24 NOTE — Plan of Care (Signed)
  Problem: Clinical Measurements: Goal: Ability to maintain clinical measurements within normal limits will improve Outcome: Progressing   Problem: Elimination: Goal: Will not experience complications related to bowel motility Outcome: Progressing Goal: Will not experience complications related to urinary retention Outcome: Progressing   Problem: Pain Managment: Goal: General experience of comfort will improve Outcome: Progressing   Problem: Safety: Goal: Ability to remain free from injury will improve Outcome: Progressing   Problem: Skin Integrity: Goal: Risk for impaired skin integrity will decrease Outcome: Progressing   

## 2022-06-24 NOTE — Progress Notes (Addendum)
PROGRESS NOTE    Ann Cole  ZOX:[id number] DOB: September 29, 1940 DOA: 06/20/2022 PCP: Ann Saint, MD   82/F  y.o. female with medical history significant of advanced dementia, chronic HFpEF, PAF not on systemic anticoagulation, hypothyroidism, HTN, chronic normocytic anemia, sent from nursing home for evaluation of worsening of dyspnea and lower extremity swelling.  Granddaughter reported that the patient was last seen normal last week. ED Course: Afebrile, VSS,  Chest x-ray showed cardiomegaly and mild pulmonary edema.  Blood work showed creatinine 1.0 K4.2 bicarb 24, hemoglobin 11.8.  Patient was given DuoNebs, IV ceftriaxone and IV azithromycin and IV Lasix.    Subjective: -Reports breathing better, no complaints,  Assessment and Plan:  Acute on chronic HFpEF -Diuresing on IV Lasix, 7.9 L negative, creatinine stable at 1.1  -Transition to oral Lasix today, monitor continue Aldactone and losartan -Echo with preserved EF, mildly reduced RV, severely elevated PA pressures -continue metoprolol, hydralazine -Poor candidate for SGLT2i with relative immobility and prior UTIs -Discussed with family regarding palliative care, after discharge  Severe pulmonary hypertension -CTA chest negative for PE, moderate right pleural effusion noted, continue diuretics as above a -With dementia and poor functional status she is not a candidate for invasive workup -Discussed palliative care with granddaughter, will send request at follow-up  Mild leukocytosis -Likely reactive, monitor, afebrile and nontoxic   Hypothyroidism -Stable, continue Synthroid   PAF -Rate controlled, not on systemic anticoagulation -continue aspirin   Advanced dementia -Stable, at risk for delirium  Debility   DVT prophylaxis: lovenox Code Status: Full Code per family Family Communication: No family at bedside, called and updated granddaughter Disposition Plan: Back to SNF likely tomorrow  Consultants:     Procedures:   Antimicrobials:    Objective: Vitals:   06/23/22 1947 06/23/22 2255 06/24/22 0441 06/24/22 0752  BP: 107/65 129/63 (!) 150/70 (!) 113/90  Pulse: 81  86 85  Resp: 18 20 15 15   Temp: (!) 97.3 F (36.3 C)  97.7 F (36.5 C) (!) 97 F (36.1 C)  TempSrc: Oral  Axillary Axillary  SpO2: 100%  97% 94%  Weight:   93.7 kg   Height:        Intake/Output Summary (Last 24 hours) at 06/24/2022 1136 Last data filed at 06/24/2022 0900 Gross per 24 hour  Intake 0 ml  Output 300 ml  Net -300 ml   Filed Weights   06/23/22 0026 06/23/22 0500 06/24/22 0441  Weight: 96.8 kg 95.1 kg 93.7 kg    Examination:  Obese pleasant female sitting up in bed, awake alert oriented to self only, moderate cognitive deficits CVS: S1-S2, regular rhythm Lungs: Decreased breath sounds in the right Abdomen: Soft, nontender, bowel sounds present Extremities: Trace edema      Data Reviewed:   CBC: Recent Labs  Lab 06/20/22 1105 06/23/22 0155 06/24/22 0111  WBC 9.9 12.3* 12.6*  NEUTROABS 6.9  --   --   HGB 11.8* 13.4 14.6  HCT 37.6 41.9 44.7  MCV 92.4 90.7 89.0  PLT 287 281 337   Basic Metabolic Panel: Recent Labs  Lab 06/20/22 1105 06/21/22 0132 06/22/22 0104 06/23/22 0155 06/24/22 0111  NA 137 139 136 135 139  K 4.2 3.9 3.5 3.5 3.8  CL 105 100 95* 93* 96*  CO2 24 26 25 27 30   GLUCOSE 106* 156* 105* 83 111*  BUN 16 16 20  25* 26*  CREATININE 1.00 1.08* 1.05* 1.23* 1.19*  CALCIUM 8.9 9.2 9.1 9.3 9.5  MG  --   --  1.9  --   --    GFR: Estimated Creatinine Clearance: 36 mL/min (A) (by C-G formula based on SCr of 1.19 mg/dL (H)). Liver Function Tests: Recent Labs  Lab 06/20/22 1105  AST 31  ALT 34  ALKPHOS 121  BILITOT 0.4  PROT 6.8  ALBUMIN 3.1*   No results for input(s): "LIPASE", "AMYLASE" in the last 168 hours. No results for input(s): "AMMONIA" in the last 168 hours. Coagulation Profile: No results for input(s): "INR", "PROTIME" in the last 168  hours. Cardiac Enzymes: No results for input(s): "CKTOTAL", "CKMB", "CKMBINDEX", "TROPONINI" in the last 168 hours. BNP (last 3 results) No results for input(s): "PROBNP" in the last 8760 hours. HbA1C: No results for input(s): "HGBA1C" in the last 72 hours. CBG: Recent Labs  Lab 06/20/22 1055  GLUCAP 104*   Lipid Profile: No results for input(s): "CHOL", "HDL", "LDLCALC", "TRIG", "CHOLHDL", "LDLDIRECT" in the last 72 hours. Thyroid Function Tests: No results for input(s): "TSH", "T4TOTAL", "FREET4", "T3FREE", "THYROIDAB" in the last 72 hours. Anemia Panel: No results for input(s): "VITAMINB12", "FOLATE", "FERRITIN", "TIBC", "IRON", "RETICCTPCT" in the last 72 hours. Urine analysis:    Component Value Date/Time   COLORURINE YELLOW 04/26/2021 1740   APPEARANCEUR HAZY (A) 04/26/2021 1740   LABSPEC 1.011 04/26/2021 1740   PHURINE 5.0 04/26/2021 1740   GLUCOSEU NEGATIVE 04/26/2021 1740   HGBUR SMALL (A) 04/26/2021 1740   BILIRUBINUR NEGATIVE 04/26/2021 1740   KETONESUR NEGATIVE 04/26/2021 1740   PROTEINUR NEGATIVE 04/26/2021 1740   NITRITE POSITIVE (A) 04/26/2021 1740   LEUKOCYTESUR SMALL (A) 04/26/2021 1740   Sepsis Labs: @LABRCNTIP (procalcitonin:4,lacticidven:4)  ) Recent Results (from the past 240 hour(s))  SARS Coronavirus 2 by RT PCR (hospital order, performed in [facility] hospital lab) *cepheid single result test* Anterior Nasal Swab     Status: None   Collection Time: 06/20/22 11:26 AM   Specimen: Anterior Nasal Swab  Result Value Ref Range Status   SARS Coronavirus 2 by RT PCR NEGATIVE NEGATIVE Final    Comment: Performed at [facility], [address], [place], [place] [address]     Radiology Studies: VAS [place] LOWER EXTREMITY VENOUS (DVT)  Result Date: 06/22/2022  Lower Venous DVT Study Patient Name:  Ann Cole  Date of Exam:   06/22/2022 Medical Rec #: [id number]              Accession #:    [id number] Date of Birth: 1941-01-14              Patient  Gender: F Patient Age:   30 years Exam Location:  [facility] Procedure:      VAS [place] LOWER EXTREMITY VENOUS (DVT) Referring Phys: Ann Cole --------------------------------------------------------------------------------  Limitations: Altered mental status, patient pain and constant movement, repeated unsolicited valsalva maneuvers, guarding. Comparison Study: 06-07-2022 Prior bilateral lower extremity venous study was                   negative for DVT. Performing Technologist: Jean Rosenthal RDMS, RVT  Examination Guidelines: A complete evaluation includes B-mode imaging, spectral Doppler, color Doppler, and power Doppler as needed of all accessible portions of each vessel. Bilateral testing is considered an integral part of a complete examination. Limited examinations for reoccurring indications may be performed as noted. The reflux portion of the exam is performed with the patient in reverse Trendelenburg.  +---------+---------------+---------+-----------+----------+-------------------+ RIGHT    CompressibilityPhasicitySpontaneityPropertiesThrombus Aging      +---------+---------------+---------+-----------+----------+-------------------+ CFV      Full  Yes      Yes                                      +---------+---------------+---------+-----------+----------+-------------------+ SFJ      Full                                                             +---------+---------------+---------+-----------+----------+-------------------+ FV Prox  Full                                                             +---------+---------------+---------+-----------+----------+-------------------+ FV Mid                  Yes      Yes                  Patent by color     +---------+---------------+---------+-----------+----------+-------------------+ FV Distal                                             Unable to visualize                                                        -see limitations    +---------+---------------+---------+-----------+----------+-------------------+ PFV      Full                                                             +---------+---------------+---------+-----------+----------+-------------------+ POP      Full           Yes      Yes                                      +---------+---------------+---------+-----------+----------+-------------------+ PTV      Full                                                             +---------+---------------+---------+-----------+----------+-------------------+ PERO     Full                                                             +---------+---------------+---------+-----------+----------+-------------------+   +----+---------------+---------+-----------+----------+--------------+  LEFTCompressibilityPhasicitySpontaneityPropertiesThrombus Aging +----+---------------+---------+-----------+----------+--------------+ CFV Full           Yes      Yes                                 +----+---------------+---------+-----------+----------+--------------+     Summary: RIGHT: - There is no evidence of deep vein thrombosis in the lower extremity. However, portions of this examination were limited- see technologist comments above.  - No cystic structure found in the popliteal fossa.  LEFT: - No evidence of common femoral vein obstruction.  *See table(s) above for measurements and observations. Electronically signed by Heath Lark on 06/22/2022 at 6:07:22 PM.    Final    CT Angio Chest Pulmonary Embolism (PE) W or WO Contrast  Result Date: 06/22/2022 CLINICAL DATA:  Dyspnea, suspected pulmonary embolism. EXAM: CT ANGIOGRAPHY CHEST WITH CONTRAST TECHNIQUE: Multidetector CT imaging of the chest was performed using the standard protocol during bolus administration of intravenous contrast. Multiplanar CT image reconstructions and MIPs were obtained to evaluate the vascular  anatomy. RADIATION DOSE REDUCTION: This exam was performed according to the departmental dose-optimization program which includes automated exposure control, adjustment of the mA and/or kV according to patient size and/or use of iterative reconstruction technique. CONTRAST:  75mL OMNIPAQUE IOHEXOL 350 MG/ML SOLN COMPARISON:  August 23, 2018. FINDINGS: Cardiovascular: Satisfactory opacification of the pulmonary arteries to the segmental level. No evidence of pulmonary embolism. Mild cardiomegaly. No pericardial effusion. Mediastinum/Nodes: No enlarged mediastinal, hilar, or axillary lymph nodes. Thyroid gland, trachea, and esophagus demonstrate no significant findings. Lungs/Pleura: No pneumothorax is noted. Moderate size right pleural effusion is noted with adjacent subsegmental atelectasis of the right lower lobe. Left lung is unremarkable. Upper Abdomen: No acute abnormality. Musculoskeletal: No chest wall abnormality. No acute or significant osseous findings. Review of the MIP images confirms the above findings. IMPRESSION: No definite evidence of pulmonary embolus. Moderate right pleural effusion is noted with adjacent subsegmental atelectasis of right lower lobe. Aortic Atherosclerosis (ICD10-I70.0). Electronically Signed   By: Lupita Raider M.D.   On: 06/22/2022 15:52     Scheduled Meds:  acetaminophen  500 mg Oral Q8H   aspirin EC  81 mg Oral Daily   enoxaparin (LOVENOX) injection  40 mg Subcutaneous Q24H   furosemide  40 mg Oral BID   levothyroxine  100 mcg Oral Daily   losartan  25 mg Oral Daily   melatonin  5 mg Oral QHS   memantine  5 mg Oral BID   metoprolol succinate  75 mg Oral Daily   sertraline  50 mg Oral q morning   sodium chloride flush  3 mL Intravenous Q12H   spironolactone  25 mg Oral Daily   Continuous Infusions:  sodium chloride       LOS: 4 days    Time spent:    Zannie Cove, MD [facility]   06/24/2022, 11:36 AM

## 2022-06-24 NOTE — Progress Notes (Signed)
Physical Therapy Treatment Patient Details Name: Nohemi Nicklaus MRN: [id number] DOB: Aug 22, 1940 Today's Date: 06/24/2022   History of Present Illness Pt is an 82 year old female admitted on 06/20/22 from nursing home for evaluation of worsening of dyspnea and lower extremity swelling. Past medical history significant of advanced dementia, chronic HFpEF, PAF not on systemic anticoagulation, hypothyroidism, HTN, chronic normocytic anemia.    PT Comments    Pt tolerated treatment well today. Pt was able to stand EOB with +2 Max A however demonstrated heavy posterior lean requiring +2 Total assist to remain upright. At this moment it appear that pt is at her baseline and PT will be signing off. No change in DC/DME recs at this time. Re consult PT if mobility status changes.    Recommendations for follow up therapy are one component of a multi-disciplinary discharge planning process, led by the attending physician.  Recommendations may be updated based on patient status, additional functional criteria and insurance authorization.  Follow Up Recommendations  Can patient physically be transported by private vehicle: No    Assistance Recommended at Discharge Frequent or constant Supervision/Assistance  Patient can return home with the following Two people to help with walking and/or transfers;Two people to help with bathing/dressing/bathroom;Assistance with cooking/housework;Assistance with feeding;Direct supervision/assist for medications management;Direct supervision/assist for financial management;Assist for transportation;Help with stairs or ramp for entrance   Equipment Recommendations  Other (comment) (Per accepting facility)    Recommendations for Other Services       Precautions / Restrictions Precautions Precautions: Fall Restrictions Weight Bearing Restrictions: No     Mobility  Bed Mobility Overal bed mobility: Needs Assistance Bed Mobility: Supine to Sit, Sit to Supine      Supine to sit: +2 for physical assistance, Max assist Sit to supine: +2 for physical assistance, Max assist        Transfers Overall transfer level: Needs assistance Equipment used: 2 person hand held assist Transfers: Sit to/from Stand Sit to Stand: +2 physical assistance, Max assist           General transfer comment: 1 sit to stand however pt demonstrated heavy posterior lean requiring total assist to remain upright.    Ambulation/Gait               General Gait Details: Pt unable   Stairs             Wheelchair Mobility    Modified Rankin (Stroke Patients Only)       Balance Overall balance assessment: Needs assistance Sitting-balance support: Bilateral upper extremity supported, Feet supported Sitting balance-Leahy Scale: Poor Sitting balance - Comments: Pt with heavy posterior lean Postural control: Posterior lean Standing balance support: Bilateral upper extremity supported, During functional activity, Reliant on assistive device for balance Standing balance-Leahy Scale: Zero Standing balance comment: Heavy posterior lean. +2 Total to remain upright.                            Cognition Arousal/Alertness: Awake/alert Behavior During Therapy:  (pt mumbles or says 1 word phrases) Overall Cognitive Status: History of cognitive impairments - at baseline                                 General Comments: Pt has advanced dementia at baseline. Per RN pt only understands spanish and only mumbles. Pt will occassionally say 1 word phrases or point.  Exercises      General Comments General comments (skin integrity, edema, etc.): VSS on RA      Pertinent Vitals/Pain Pain Assessment Pain Assessment: No/denies pain    Home Living                          Prior Function            PT Goals (current goals can now be found in the care plan section) Progress towards PT goals: Goals met/education  completed, patient discharged from PT    Frequency    Min 1X/week      PT Plan Current plan remains appropriate    Co-evaluation              AM-PAC PT "6 Clicks" Mobility   Outcome Measure  Help needed turning from your back to your side while in a flat bed without using bedrails?: Total Help needed moving from lying on your back to sitting on the side of a flat bed without using bedrails?: Total Help needed moving to and from a bed to a chair (including a wheelchair)?: Total Help needed standing up from a chair using your arms (e.g., wheelchair or bedside chair)?: Total Help needed to walk in hospital room?: Total Help needed climbing 3-5 steps with a railing? : Total 6 Click Score: 6    End of Session   Activity Tolerance: Patient tolerated treatment well Patient left: in bed;with call bell/phone within reach;with bed alarm set Nurse Communication: Mobility status PT Visit Diagnosis: Other abnormalities of gait and mobility (R26.89);Muscle weakness (generalized) (M62.81);Difficulty in walking, not elsewhere classified (R26.2)     Time: 1440-1456 PT Time Calculation (min) (ACUTE ONLY): 16 min  Charges:  $Therapeutic Activity: 8-22 mins                     Shela Nevin, PT, DPT Acute Rehab Services [phone]    Gladys Damme 06/24/2022, 4:13 PM

## 2022-06-25 ENCOUNTER — Other Ambulatory Visit: Payer: Self-pay

## 2022-06-25 DIAGNOSIS — I5033 Acute on chronic diastolic (congestive) heart failure: Secondary | ICD-10-CM | POA: Diagnosis not present

## 2022-06-25 LAB — BASIC METABOLIC PANEL
Anion gap: 15 (ref 5–15)
BUN: 21 mg/dL (ref 8–23)
CO2: 26 mmol/L (ref 22–32)
Calcium: 9.8 mg/dL (ref 8.9–10.3)
Chloride: 97 mmol/L — ABNORMAL LOW (ref 98–111)
Creatinine, Ser: 1.22 mg/dL — ABNORMAL HIGH (ref 0.44–1.00)
GFR, Estimated: 44 mL/min — ABNORMAL LOW (ref >60)
Glucose, Bld: 86 mg/dL (ref 70–99)
Potassium: 4.3 mmol/L (ref 3.5–5.1)
Sodium: 138 mmol/L (ref 135–145)

## 2022-06-25 LAB — CBC
HCT: 48.9 % — ABNORMAL HIGH (ref 36.0–46.0)
Hemoglobin: 15.9 g/dL — ABNORMAL HIGH (ref 12.0–15.0)
MCH: 29.4 pg (ref 26.0–34.0)
MCHC: 32.5 g/dL (ref 30.0–36.0)
MCV: 90.6 fL (ref 80.0–100.0)
Platelets: 348 10*3/uL (ref 150–400)
RBC: 5.4 MIL/uL — ABNORMAL HIGH (ref 3.87–5.11)
RDW: 16.8 % — ABNORMAL HIGH (ref 11.5–15.5)
WBC: 11.1 10*3/uL — ABNORMAL HIGH (ref 4.0–10.5)
nRBC: 0 % (ref 0.0–0.2)

## 2022-06-25 MED ORDER — FUROSEMIDE 40 MG PO TABS
40.0000 mg | ORAL_TABLET | Freq: Every day | ORAL | Status: DC
  Administered 2022-06-25: 40 mg via ORAL
  Filled 2022-06-25: qty 1

## 2022-06-25 MED ORDER — METOPROLOL SUCCINATE ER 50 MG PO TB24
50.0000 mg | ORAL_TABLET | Freq: Every day | ORAL | Status: DC
  Administered 2022-06-25 – 2022-06-29 (×4): 50 mg via ORAL
  Filled 2022-06-25 (×4): qty 1

## 2022-06-25 MED ORDER — ENSURE ENLIVE PO LIQD
237.0000 mL | Freq: Two times a day (BID) | ORAL | Status: DC
  Administered 2022-06-26 – 2022-06-28 (×5): 237 mL via ORAL
  Administered 2022-06-29: 137 mL via ORAL

## 2022-06-25 NOTE — Progress Notes (Signed)
PROGRESS NOTE    Ann Cole  RUE:[id number] DOB: 07-07-1940 DOA: 06/20/2022 PCP: Deeann Saint, MD   82/F  y.o. female with medical history significant of advanced dementia, chronic HFpEF, PAF not on systemic anticoagulation, hypothyroidism, HTN, chronic normocytic anemia, sent from nursing home for evaluation of worsening of dyspnea and lower extremity swelling.  Granddaughter reported that the patient was last seen normal last week. ED Course: Afebrile, VSS,  Chest x-ray showed cardiomegaly and mild pulmonary edema.  Blood work showed creatinine 1.0 K4.2 bicarb 24, hemoglobin 11.8.  Patient was given DuoNebs, IV ceftriaxone and IV azithromycin and IV Lasix.    Subjective: -Blood pressure soft this morning, sleepy, little less interactive  Assessment and Plan:  Acute on chronic HFpEF -Echo with preserved EF, mildly reduced RV, severely elevated PA pressures -Diuresed well on IV Lasix she is 9 L negative, weight down 30 LB -Blood pressure soft/low today, hold losartan, cut down Lasix and Toprol dose, continue Aldactone -Poor candidate for SGLT2i with relative immobility and prior UTIs -Discussed with family regarding palliative care, after discharge  Severe pulmonary hypertension -CTA chest negative for PE, moderate right pleural effusion noted, continue diuretics as above a -With dementia and poor functional status she is not a candidate for invasive workup -Discussed palliative care with granddaughter, will send request at follow-up  Mild leukocytosis -Likely reactive, monitor, afebrile and nontoxic   Hypothyroidism -Stable, continue Synthroid   PAF -Rate controlled, not on systemic anticoagulation -continue aspirin   Advanced dementia -Stable, at risk for delirium  Debility   DVT prophylaxis: lovenox Code Status: Full Code per family Family Communication: No family at bedside, called and updated granddaughter 6/7 Disposition Plan: Back to SNF likely  tomorrow  Consultants:    Procedures:   Antimicrobials:    Objective: Vitals:   06/24/22 2300 06/25/22 0441 06/25/22 0815 06/25/22 0826  BP: 90/75 (!) 107/49 (!) 105/53 99/87  Pulse: 81 88 67 73  Resp: 18 18 18    Temp: (!) 97.2 F (36.2 C) (!) 97 F (36.1 C) 97.6 F (36.4 C)   TempSrc: Axillary Axillary Axillary   SpO2: 96% 100% 96%   Weight:  88.6 kg    Height:        Intake/Output Summary (Last 24 hours) at 06/25/2022 1048 Last data filed at 06/25/2022 0526 Gross per 24 hour  Intake 400 ml  Output 1475 ml  Net -1075 ml   Filed Weights   06/23/22 0500 06/24/22 0441 06/25/22 0441  Weight: 95.1 kg 93.7 kg 88.6 kg    Examination:  Somnolent elderly female sitting up in bed, easily arousable, oriented to self only, moderate cognitive deficits CVS: S1-S2, regular rhythm Lungs: Decreased breath sounds in the right Abdomen: Soft, nontender, bowel sounds present  Extremities: Trace edema      Data Reviewed:   CBC: Recent Labs  Lab 06/20/22 1105 06/23/22 0155 06/24/22 0111 06/25/22 0115  WBC 9.9 12.3* 12.6* 11.1*  NEUTROABS 6.9  --   --   --   HGB 11.8* 13.4 14.6 15.9*  HCT 37.6 41.9 44.7 48.9*  MCV 92.4 90.7 89.0 90.6  PLT 287 281 337 348   Basic Metabolic Panel: Recent Labs  Lab 06/21/22 0132 06/22/22 0104 06/23/22 0155 06/24/22 0111 06/25/22 0115  NA 139 136 135 139 138  K 3.9 3.5 3.5 3.8 4.3  CL 100 95* 93* 96* 97*  CO2 26 25 27 30 26   GLUCOSE 156* 105* 83 111* 86  BUN 16 20 25*  26* 21  CREATININE 1.08* 1.05* 1.23* 1.19* 1.22*  CALCIUM 9.2 9.1 9.3 9.5 9.8  MG  --  1.9  --   --   --    GFR: Estimated Creatinine Clearance: 34 mL/min (A) (by C-G formula based on SCr of 1.22 mg/dL (H)). Liver Function Tests: Recent Labs  Lab 06/20/22 1105  AST 31  ALT 34  ALKPHOS 121  BILITOT 0.4  PROT 6.8  ALBUMIN 3.1*   No results for input(s): "LIPASE", "AMYLASE" in the last 168 hours. No results for input(s): "AMMONIA" in the last 168  hours. Coagulation Profile: No results for input(s): "INR", "PROTIME" in the last 168 hours. Cardiac Enzymes: No results for input(s): "CKTOTAL", "CKMB", "CKMBINDEX", "TROPONINI" in the last 168 hours. BNP (last 3 results) No results for input(s): "PROBNP" in the last 8760 hours. HbA1C: No results for input(s): "HGBA1C" in the last 72 hours. CBG: Recent Labs  Lab 06/20/22 1055  GLUCAP 104*   Lipid Profile: No results for input(s): "CHOL", "HDL", "LDLCALC", "TRIG", "CHOLHDL", "LDLDIRECT" in the last 72 hours. Thyroid Function Tests: No results for input(s): "TSH", "T4TOTAL", "FREET4", "T3FREE", "THYROIDAB" in the last 72 hours. Anemia Panel: No results for input(s): "VITAMINB12", "FOLATE", "FERRITIN", "TIBC", "IRON", "RETICCTPCT" in the last 72 hours. Urine analysis:    Component Value Date/Time   COLORURINE YELLOW 04/26/2021 1740   APPEARANCEUR HAZY (A) 04/26/2021 1740   LABSPEC 1.011 04/26/2021 1740   PHURINE 5.0 04/26/2021 1740   GLUCOSEU NEGATIVE 04/26/2021 1740   HGBUR SMALL (A) 04/26/2021 1740   BILIRUBINUR NEGATIVE 04/26/2021 1740   KETONESUR NEGATIVE 04/26/2021 1740   PROTEINUR NEGATIVE 04/26/2021 1740   NITRITE POSITIVE (A) 04/26/2021 1740   LEUKOCYTESUR SMALL (A) 04/26/2021 1740   Sepsis Labs: @LABRCNTIP (procalcitonin:4,lacticidven:4)  ) Recent Results (from the past 240 hour(s))  SARS Coronavirus 2 by RT PCR (hospital order, performed in [facility] hospital lab) *cepheid single result test* Anterior Nasal Swab     Status: None   Collection Time: 06/20/22 11:26 AM   Specimen: Anterior Nasal Swab  Result Value Ref Range Status   SARS Coronavirus 2 by RT PCR NEGATIVE NEGATIVE Final    Comment: Performed at [facility], [address] [place], [place] [address]     Radiology Studies: No results found.   Scheduled Meds:  acetaminophen  500 mg Oral Q8H   aspirin EC  81 mg Oral Daily   enoxaparin (LOVENOX) injection  40 mg Subcutaneous Q24H    furosemide  40 mg Oral BID   levothyroxine  100 mcg Oral Daily   melatonin  5 mg Oral QHS   memantine  5 mg Oral BID   metoprolol succinate  50 mg Oral Daily   sertraline  50 mg Oral q morning   sodium chloride flush  3 mL Intravenous Q12H   spironolactone  25 mg Oral Daily   Continuous Infusions:  sodium chloride       LOS: 5 days    Time spent:    Zannie Cove, MD [facility]   06/25/2022, 10:48 AM

## 2022-06-25 NOTE — Plan of Care (Signed)
  Problem: Clinical Measurements: Goal: Diagnostic test results will improve Outcome: Progressing Goal: Respiratory complications will improve Outcome: Progressing Goal: Cardiovascular complication will be avoided Outcome: Progressing   Problem: Activity: Goal: Risk for activity intolerance will decrease Outcome: Progressing   Problem: Elimination: Goal: Will not experience complications related to bowel motility Outcome: Progressing Goal: Will not experience complications related to urinary retention Outcome: Progressing   Problem: Nutrition: Goal: Adequate nutrition will be maintained Outcome: Not Progressing

## 2022-06-26 DIAGNOSIS — I5033 Acute on chronic diastolic (congestive) heart failure: Secondary | ICD-10-CM | POA: Diagnosis not present

## 2022-06-26 LAB — URINALYSIS, ROUTINE W REFLEX MICROSCOPIC
Bilirubin Urine: NEGATIVE
Glucose, UA: NEGATIVE mg/dL
Hgb urine dipstick: NEGATIVE
Ketones, ur: NEGATIVE mg/dL
Nitrite: NEGATIVE
Protein, ur: NEGATIVE mg/dL
Specific Gravity, Urine: 1.017 (ref 1.005–1.030)
pH: 5 (ref 5.0–8.0)

## 2022-06-26 LAB — BASIC METABOLIC PANEL
Anion gap: 13 (ref 5–15)
BUN: 40 mg/dL — ABNORMAL HIGH (ref 8–23)
CO2: 25 mmol/L (ref 22–32)
Calcium: 9.7 mg/dL (ref 8.9–10.3)
Chloride: 98 mmol/L (ref 98–111)
Creatinine, Ser: 2.05 mg/dL — ABNORMAL HIGH (ref 0.44–1.00)
GFR, Estimated: 23 mL/min — ABNORMAL LOW (ref >60)
Glucose, Bld: 124 mg/dL — ABNORMAL HIGH (ref 70–99)
Potassium: 4.7 mmol/L (ref 3.5–5.1)
Sodium: 136 mmol/L (ref 135–145)

## 2022-06-26 LAB — CBC
HCT: 48.4 % — ABNORMAL HIGH (ref 36.0–46.0)
Hemoglobin: 15.7 g/dL — ABNORMAL HIGH (ref 12.0–15.0)
MCH: 29.3 pg (ref 26.0–34.0)
MCHC: 32.4 g/dL (ref 30.0–36.0)
MCV: 90.3 fL (ref 80.0–100.0)
Platelets: 328 10*3/uL (ref 150–400)
RBC: 5.36 MIL/uL — ABNORMAL HIGH (ref 3.87–5.11)
RDW: 16.5 % — ABNORMAL HIGH (ref 11.5–15.5)
WBC: 13.3 10*3/uL — ABNORMAL HIGH (ref 4.0–10.5)
nRBC: 0 % (ref 0.0–0.2)

## 2022-06-26 MED ORDER — SODIUM CHLORIDE 0.9 % IV SOLN
250.0000 mL | INTRAVENOUS | Status: DC
  Administered 2022-06-26: 250 mL via INTRAVENOUS

## 2022-06-26 MED ORDER — SODIUM CHLORIDE 0.9 % IV SOLN
250.0000 mL | INTRAVENOUS | Status: AC
  Administered 2022-06-26: 250 mL via INTRAVENOUS

## 2022-06-26 MED ORDER — SODIUM CHLORIDE 0.9 % IV BOLUS
500.0000 mL | Freq: Once | INTRAVENOUS | Status: AC
  Administered 2022-06-26: 500 mL via INTRAVENOUS

## 2022-06-26 NOTE — Plan of Care (Signed)
  Problem: Pain Managment: Goal: General experience of comfort will improve Outcome: Progressing   Problem: Safety: Goal: Ability to remain free from injury will improve Outcome: Progressing   Problem: Skin Integrity: Goal: Risk for impaired skin integrity will decrease Outcome: Progressing   Problem: Nutrition: Goal: Adequate nutrition will be maintained Outcome: Not Progressing   Problem: Elimination: Goal: Will not experience complications related to urinary retention Outcome: Not Progressing

## 2022-06-26 NOTE — Progress Notes (Signed)
PROGRESS NOTE    Ann Cole  UEA:[id number] DOB: August 20, 1940 DOA: 06/20/2022 PCP: Deeann Saint, MD   82/F  y.o. female with medical history significant of advanced dementia, chronic HFpEF, PAF not on systemic anticoagulation, hypothyroidism, HTN, chronic normocytic anemia, sent from nursing home for evaluation of worsening of dyspnea and lower extremity swelling.  Granddaughter reported that the patient was last seen normal last week. ED Course: Afebrile, VSS,  Chest x-ray showed cardiomegaly and mild pulmonary edema.  Blood work showed creatinine 1.0 K4.2 bicarb 24, hemoglobin 11.8.  Patient was given DuoNebs, IV ceftriaxone and IV azithromycin and IV Lasix.    Subjective: -Poor p.o. intake yesterday, urine output low as well  Assessment and Plan:  Acute on chronic HFpEF -Echo with preserved EF, mildly reduced RV, severely elevated PA pressures -Diuresed well on IV Lasix she is 9 L negative, weight down 30 LB -With low blood pressures yesterday and worsening AKI today, hold losartan, Lasix, Aldactone  -Give 500 mL fluid back today -Poor candidate for SGLT2i with relative immobility and prior UTIs -Discussed with family regarding palliative care, after discharge  Severe pulmonary hypertension -CTA chest negative for PE, moderate right pleural effusion noted, continue diuretics as above a -With dementia and poor functional status she is not a candidate for invasive workup -Discussed palliative care with granddaughter, will send request at follow-up  Mild leukocytosis -Likely reactive, UA pending   Hypothyroidism -Stable, continue Synthroid   PAF -Rate controlled, not on systemic anticoagulation -continue aspirin   Advanced dementia -Stable, at risk for delirium  Debility   DVT prophylaxis: lovenox Code Status: Full Code per family Family Communication: No family at bedside, called and updated granddaughter 6/7 Disposition Plan: Back to SNF likely  tomorrow  Consultants:    Procedures:   Antimicrobials:    Objective: Vitals:   06/26/22 0425 06/26/22 0442 06/26/22 0722 06/26/22 1009  BP: 136/75  115/67 (!) 96/49  Pulse:   85 81  Resp: 17  18   Temp: 98.8 F (37.1 C)  98.1 F (36.7 C)   TempSrc: Axillary  Axillary   SpO2:   100%   Weight:  88.3 kg    Height:        Intake/Output Summary (Last 24 hours) at 06/26/2022 1029 Last data filed at 06/26/2022 0645 Gross per 24 hour  Intake 466 ml  Output 510 ml  Net -44 ml   Filed Weights   06/24/22 0441 06/25/22 0441 06/26/22 0442  Weight: 93.7 kg 88.6 kg 88.3 kg    Examination:  Elderly chronically ill female laying in bed, somnolent but easily arousable, oriented to self only, moderate cognitive deficits CVS: S1-S2, regular rhythm Lungs: Decreased breath sounds at the bases Abdomen: Soft, nontender, bowel sounds present Extremities: No edema      Data Reviewed:   CBC: Recent Labs  Lab 06/20/22 1105 06/23/22 0155 06/24/22 0111 06/25/22 0115 06/26/22 0132  WBC 9.9 12.3* 12.6* 11.1* 13.3*  NEUTROABS 6.9  --   --   --   --   HGB 11.8* 13.4 14.6 15.9* 15.7*  HCT 37.6 41.9 44.7 48.9* 48.4*  MCV 92.4 90.7 89.0 90.6 90.3  PLT 287 281 337 348 328   Basic Metabolic Panel: Recent Labs  Lab 06/22/22 0104 06/23/22 0155 06/24/22 0111 06/25/22 0115 06/26/22 0132  NA 136 135 139 138 136  K 3.5 3.5 3.8 4.3 4.7  CL 95* 93* 96* 97* 98  CO2 25 27 30 26 25   GLUCOSE 105*  83 111* 86 124*  BUN 20 25* 26* 21 40*  CREATININE 1.05* 1.23* 1.19* 1.22* 2.05*  CALCIUM 9.1 9.3 9.5 9.8 9.7  MG 1.9  --   --   --   --    GFR: Estimated Creatinine Clearance: 20.2 mL/min (A) (by C-G formula based on SCr of 2.05 mg/dL (H)). Liver Function Tests: Recent Labs  Lab 06/20/22 1105  AST 31  ALT 34  ALKPHOS 121  BILITOT 0.4  PROT 6.8  ALBUMIN 3.1*   No results for input(s): "LIPASE", "AMYLASE" in the last 168 hours. No results for input(s): "AMMONIA" in the last 168  hours. Coagulation Profile: No results for input(s): "INR", "PROTIME" in the last 168 hours. Cardiac Enzymes: No results for input(s): "CKTOTAL", "CKMB", "CKMBINDEX", "TROPONINI" in the last 168 hours. BNP (last 3 results) No results for input(s): "PROBNP" in the last 8760 hours. HbA1C: No results for input(s): "HGBA1C" in the last 72 hours. CBG: Recent Labs  Lab 06/20/22 1055  GLUCAP 104*   Lipid Profile: No results for input(s): "CHOL", "HDL", "LDLCALC", "TRIG", "CHOLHDL", "LDLDIRECT" in the last 72 hours. Thyroid Function Tests: No results for input(s): "TSH", "T4TOTAL", "FREET4", "T3FREE", "THYROIDAB" in the last 72 hours. Anemia Panel: No results for input(s): "VITAMINB12", "FOLATE", "FERRITIN", "TIBC", "IRON", "RETICCTPCT" in the last 72 hours. Urine analysis:    Component Value Date/Time   COLORURINE YELLOW 04/26/2021 1740   APPEARANCEUR HAZY (A) 04/26/2021 1740   LABSPEC 1.011 04/26/2021 1740   PHURINE 5.0 04/26/2021 1740   GLUCOSEU NEGATIVE 04/26/2021 1740   HGBUR SMALL (A) 04/26/2021 1740   BILIRUBINUR NEGATIVE 04/26/2021 1740   KETONESUR NEGATIVE 04/26/2021 1740   PROTEINUR NEGATIVE 04/26/2021 1740   NITRITE POSITIVE (A) 04/26/2021 1740   LEUKOCYTESUR SMALL (A) 04/26/2021 1740   Sepsis Labs: @LABRCNTIP (procalcitonin:4,lacticidven:4)  ) Recent Results (from the past 240 hour(s))  SARS Coronavirus 2 by RT PCR (hospital order, performed in [facility] hospital lab) *cepheid single result test* Anterior Nasal Swab     Status: None   Collection Time: 06/20/22 11:26 AM   Specimen: Anterior Nasal Swab  Result Value Ref Range Status   SARS Coronavirus 2 by RT PCR NEGATIVE NEGATIVE Final    Comment: Performed at [facility], [address] [place], [place] [address]     Radiology Studies: No results found.   Scheduled Meds:  acetaminophen  500 mg Oral Q8H   aspirin EC  81 mg Oral Daily   enoxaparin (LOVENOX) injection  40 mg Subcutaneous Q24H    feeding supplement  237 mL Oral BID BM   levothyroxine  100 mcg Oral Daily   melatonin  5 mg Oral QHS   memantine  5 mg Oral BID   metoprolol succinate  50 mg Oral Daily   sertraline  50 mg Oral q morning   sodium chloride flush  3 mL Intravenous Q12H   Continuous Infusions:  sodium chloride       LOS: 6 days    Time spent:    Zannie Cove, MD [facility]   06/26/2022, 10:29 AM

## 2022-06-27 ENCOUNTER — Inpatient Hospital Stay (HOSPITAL_COMMUNITY): Payer: Medicare Other

## 2022-06-27 DIAGNOSIS — I5033 Acute on chronic diastolic (congestive) heart failure: Secondary | ICD-10-CM | POA: Diagnosis not present

## 2022-06-27 LAB — CBC
HCT: 45.7 % (ref 36.0–46.0)
HCT: 44.9 % (ref 36.0–46.0)
Hemoglobin: 14.6 g/dL (ref 12.0–15.0)
Hemoglobin: 14.1 g/dL (ref 12.0–15.0)
MCH: 29.2 pg (ref 26.0–34.0)
MCH: 29.1 pg (ref 26.0–34.0)
MCHC: 31.9 g/dL (ref 30.0–36.0)
MCHC: 31.4 g/dL (ref 30.0–36.0)
MCV: 91.4 fL (ref 80.0–100.0)
MCV: 92.8 fL (ref 80.0–100.0)
Platelets: 311 10*3/uL (ref 150–400)
Platelets: 315 10*3/uL (ref 150–400)
RBC: 5 MIL/uL (ref 3.87–5.11)
RBC: 4.84 MIL/uL (ref 3.87–5.11)
RDW: 16.3 % — ABNORMAL HIGH (ref 11.5–15.5)
RDW: 16.4 % — ABNORMAL HIGH (ref 11.5–15.5)
WBC: 14.2 10*3/uL — ABNORMAL HIGH (ref 4.0–10.5)
WBC: 13.8 10*3/uL — ABNORMAL HIGH (ref 4.0–10.5)
nRBC: 0 % (ref 0.0–0.2)
nRBC: 0 % (ref 0.0–0.2)

## 2022-06-27 LAB — BASIC METABOLIC PANEL
Anion gap: 12 (ref 5–15)
BUN: 43 mg/dL — ABNORMAL HIGH (ref 8–23)
CO2: 25 mmol/L (ref 22–32)
Calcium: 9.2 mg/dL (ref 8.9–10.3)
Chloride: 98 mmol/L (ref 98–111)
Creatinine, Ser: 1.91 mg/dL — ABNORMAL HIGH (ref 0.44–1.00)
GFR, Estimated: 26 mL/min — ABNORMAL LOW (ref >60)
Glucose, Bld: 108 mg/dL — ABNORMAL HIGH (ref 70–99)
Potassium: 4 mmol/L (ref 3.5–5.1)
Sodium: 135 mmol/L (ref 135–145)

## 2022-06-27 LAB — URINE CULTURE

## 2022-06-27 LAB — PROCALCITONIN: Procalcitonin: <0.1 ng/mL

## 2022-06-27 MED ORDER — CHLORHEXIDINE GLUCONATE CLOTH 2 % EX PADS
6.0000 | MEDICATED_PAD | Freq: Every day | CUTANEOUS | Status: DC
  Administered 2022-06-27 – 2022-06-28 (×2): 6 via TOPICAL

## 2022-06-27 MED ORDER — ENOXAPARIN SODIUM 30 MG/0.3ML IJ SOSY
30.0000 mg | PREFILLED_SYRINGE | INTRAMUSCULAR | Status: DC
  Administered 2022-06-27 – 2022-06-28 (×2): 30 mg via SUBCUTANEOUS
  Filled 2022-06-27 (×2): qty 0.3

## 2022-06-27 NOTE — Progress Notes (Signed)
PROGRESS NOTE    Ann Cole  WUJ:[id number] DOB: 1940/03/16 DOA: 06/20/2022 PCP: Deeann Saint, MD   82/F  y.o. female with medical history significant of advanced dementia, chronic HFpEF, PAF not on systemic anticoagulation, hypothyroidism, HTN, chronic normocytic anemia, sent from nursing home for evaluation of worsening of dyspnea and lower extremity swelling.  Granddaughter reported that the patient was last seen normal last week. ED Course: Afebrile, VSS,  Chest x-ray showed cardiomegaly and mild pulmonary edema.  Blood work showed creatinine 1.0 K4.2 bicarb 24, hemoglobin 11.8.  Patient was given DuoNebs, IV ceftriaxone and IV azithromycin and IV Lasix. -Improved with diuretics, hospital course then complicated by leukocytosis and lethargy, hypotension, AKI    Subjective: -Feels better today today, blood pressure is more stable, she denies any complaints  Assessment and Plan:  Acute on chronic HFpEF -Echo with preserved EF, mildly reduced RV, severely elevated PA pressures -Diuresed well on IV Lasix she is 9 L negative, weight down 30 LB -With soft BPs and AKI held losartan Lasix and Aldactone -Resume Lasix and Aldactone tomorrow -Poor candidate for SGLT2i with relative immobility and prior UTIs -Discussed with family regarding palliative care, after discharge  Severe pulmonary hypertension -CTA chest negative for PE, moderate right pleural effusion noted, continue diuretics as above a -With dementia and poor functional status she is not a candidate for invasive workup -Discussed palliative care with granddaughter, will send request at follow-up  Mild leukocytosis -Likely reactive, UA is unremarkable, Foley catheter placed for retention 6/9, follow-up urine culture -Check x-ray and procalcitonin   Hypothyroidism -Stable, continue Synthroid   PAF -Rate controlled, not on systemic anticoagulation -continue aspirin   Advanced dementia -Stable, at risk for  delirium  Debility   DVT prophylaxis: lovenox Code Status: Full Code per family Family Communication: No family at bedside, called and updated granddaughter 6/8 Disposition Plan: Back to SNF likely 1 to 2 days  Consultants:    Procedures:   Antimicrobials:    Objective: Vitals:   06/27/22 0005 06/27/22 0439 06/27/22 0729 06/27/22 1055  BP: (!) 102/48 (!) 105/52 (!) 117/53 (!) 111/97  Pulse: 87 60 85 93  Resp: 19 19 19 20   Temp: 99.2 F (37.3 C) 99 F (37.2 C) 97.7 F (36.5 C) (!) 97.4 F (36.3 C)  TempSrc: Axillary Axillary Axillary Axillary  SpO2: 94% 94% 95% 96%  Weight:  90.4 kg    Height:        Intake/Output Summary (Last 24 hours) at 06/27/2022 1128 Last data filed at 06/27/2022 1012 Gross per 24 hour  Intake 3091.67 ml  Output 525 ml  Net 2566.67 ml   Filed Weights   06/25/22 0441 06/26/22 0442 06/27/22 0439  Weight: 88.6 kg 88.3 kg 90.4 kg    Examination:  Elderly chronically ill female laying in bed, somnolent but easily arousable, oriented to self only, moderate cognitive deficits CVS: S1-S2, regular rhythm Lungs: Decreased breath sounds at the bases Abdomen: Soft, nontender, bowel sounds present Extremities: No edema      Data Reviewed:   CBC: Recent Labs  Lab 06/23/22 0155 06/24/22 0111 06/25/22 0115 06/26/22 0132 06/27/22 0107  WBC 12.3* 12.6* 11.1* 13.3* 14.2*  HGB 13.4 14.6 15.9* 15.7* 14.6  HCT 41.9 44.7 48.9* 48.4* 45.7  MCV 90.7 89.0 90.6 90.3 91.4  PLT 281 337 348 328 311   Basic Metabolic Panel: Recent Labs  Lab 06/22/22 0104 06/23/22 0155 06/24/22 0111 06/25/22 0115 06/26/22 0132 06/27/22 0107  NA 136 135 139  138 136 135  K 3.5 3.5 3.8 4.3 4.7 4.0  CL 95* 93* 96* 97* 98 98  CO2 25 27 30 26 25 25   GLUCOSE 105* 83 111* 86 124* 108*  BUN 20 25* 26* 21 40* 43*  CREATININE 1.05* 1.23* 1.19* 1.22* 2.05* 1.91*  CALCIUM 9.1 9.3 9.5 9.8 9.7 9.2  MG 1.9  --   --   --   --   --    GFR: Estimated Creatinine Clearance:  22 mL/min (A) (by C-G formula based on SCr of 1.91 mg/dL (H)). Liver Function Tests: No results for input(s): "AST", "ALT", "ALKPHOS", "BILITOT", "PROT", "ALBUMIN" in the last 168 hours.  No results for input(s): "LIPASE", "AMYLASE" in the last 168 hours. No results for input(s): "AMMONIA" in the last 168 hours. Coagulation Profile: No results for input(s): "INR", "PROTIME" in the last 168 hours. Cardiac Enzymes: No results for input(s): "CKTOTAL", "CKMB", "CKMBINDEX", "TROPONINI" in the last 168 hours. BNP (last 3 results) No results for input(s): "PROBNP" in the last 8760 hours. HbA1C: No results for input(s): "HGBA1C" in the last 72 hours. CBG: No results for input(s): "GLUCAP" in the last 168 hours.  Lipid Profile: No results for input(s): "CHOL", "HDL", "LDLCALC", "TRIG", "CHOLHDL", "LDLDIRECT" in the last 72 hours. Thyroid Function Tests: No results for input(s): "TSH", "T4TOTAL", "FREET4", "T3FREE", "THYROIDAB" in the last 72 hours. Anemia Panel: No results for input(s): "VITAMINB12", "FOLATE", "FERRITIN", "TIBC", "IRON", "RETICCTPCT" in the last 72 hours. Urine analysis:    Component Value Date/Time   COLORURINE YELLOW 06/26/2022 1250   APPEARANCEUR CLEAR 06/26/2022 1250   LABSPEC 1.017 06/26/2022 1250   PHURINE 5.0 06/26/2022 1250   GLUCOSEU NEGATIVE 06/26/2022 1250   HGBUR NEGATIVE 06/26/2022 1250   BILIRUBINUR NEGATIVE 06/26/2022 1250   KETONESUR NEGATIVE 06/26/2022 1250   PROTEINUR NEGATIVE 06/26/2022 1250   NITRITE NEGATIVE 06/26/2022 1250   LEUKOCYTESUR TRACE (A) 06/26/2022 1250   Sepsis Labs: @LABRCNTIP (procalcitonin:4,lacticidven:4)  ) Recent Results (from the past 240 hour(s))  SARS Coronavirus 2 by RT PCR (hospital order, performed in [facility] hospital lab) *cepheid single result test* Anterior Nasal Swab     Status: None   Collection Time: 06/20/22 11:26 AM   Specimen: Anterior Nasal Swab  Result Value Ref Range Status   SARS Coronavirus 2 by RT  PCR NEGATIVE NEGATIVE Final    Comment: Performed at [facility], [address], [place], [place] [address]     Radiology Studies: DG CHEST PORT 1 VIEW  Result Date: 06/27/2022 CLINICAL DATA:  Cough EXAM: PORTABLE CHEST 1 VIEW COMPARISON:  06/22/2022 CT FINDINGS: Cardiac shadow is enlarged. Aortic calcifications are noted. Previously seen right-sided pleural effusion is not well seen on this exam. No focal infiltrate is noted. IMPRESSION: No acute abnormality noted. Electronically Signed   By: Alcide Clever M.D.   On: 06/27/2022 11:09     Scheduled Meds:  acetaminophen  500 mg Oral Q8H   aspirin EC  81 mg Oral Daily   Chlorhexidine Gluconate Cloth  6 each Topical Daily   enoxaparin (LOVENOX) injection  40 mg Subcutaneous Q24H   feeding supplement  237 mL Oral BID BM   levothyroxine  100 mcg Oral Daily   melatonin  5 mg Oral QHS   memantine  5 mg Oral BID   metoprolol succinate  50 mg Oral Daily   sertraline  50 mg Oral q morning   sodium chloride flush  3 mL Intravenous Q12H   Continuous Infusions:  LOS: 7 days    Time spent:    Zannie Cove, MD [facility]   06/27/2022, 11:28 AM

## 2022-06-28 DIAGNOSIS — I5033 Acute on chronic diastolic (congestive) heart failure: Secondary | ICD-10-CM | POA: Diagnosis not present

## 2022-06-28 LAB — CBC
HCT: 44.3 % (ref 36.0–46.0)
Hemoglobin: 13.7 g/dL (ref 12.0–15.0)
MCH: 28.5 pg (ref 26.0–34.0)
MCHC: 30.9 g/dL (ref 30.0–36.0)
MCV: 92.1 fL (ref 80.0–100.0)
Platelets: 313 10*3/uL (ref 150–400)
RBC: 4.81 MIL/uL (ref 3.87–5.11)
RDW: 16.3 % — ABNORMAL HIGH (ref 11.5–15.5)
WBC: 13.3 10*3/uL — ABNORMAL HIGH (ref 4.0–10.5)
nRBC: 0 % (ref 0.0–0.2)

## 2022-06-28 LAB — BASIC METABOLIC PANEL
Anion gap: 10 (ref 5–15)
BUN: 29 mg/dL — ABNORMAL HIGH (ref 8–23)
CO2: 28 mmol/L (ref 22–32)
Calcium: 9.5 mg/dL (ref 8.9–10.3)
Chloride: 99 mmol/L (ref 98–111)
Creatinine, Ser: 1.3 mg/dL — ABNORMAL HIGH (ref 0.44–1.00)
GFR, Estimated: 41 mL/min — ABNORMAL LOW (ref >60)
Glucose, Bld: 127 mg/dL — ABNORMAL HIGH (ref 70–99)
Potassium: 4 mmol/L (ref 3.5–5.1)
Sodium: 137 mmol/L (ref 135–145)

## 2022-06-28 MED ORDER — FUROSEMIDE 40 MG PO TABS
40.0000 mg | ORAL_TABLET | Freq: Every day | ORAL | Status: DC
  Administered 2022-06-28 – 2022-06-29 (×2): 40 mg via ORAL
  Filled 2022-06-28 (×2): qty 1

## 2022-06-28 NOTE — Progress Notes (Signed)
    Referral received for Ann Cole :goals of care discussion. Chart reviewed in detail. Patient assessed and is unable to engage appropriately in discussions. She is oriented x1 to self. She reports left hand pain at venipuncture site. No other concerns.   Attempted to contact patient's granddaughter Ann Cole. Unable to reach. Voicemail left with contact information given.   PMT will re-attempt to contact family at a later time/date. Detailed note and recommendations to follow once GOC has been completed.   Thank you for your referral and allowing PMT to assist in Ms. Ann Cole's care.   Richardson Dopp, [facility] Palliative Medicine Team  Team Phone # [phone]   NO CHARGE

## 2022-06-28 NOTE — TOC Progression Note (Signed)
Transition of Care [facility]) - Progression Note    Patient Details  Name: Shenise Wolgamott MRN: [id number] Date of Birth: 01/30/1940  [facility] CM/SW Contact  Leander Rams, LCSW Phone Number: 06/28/2022, 12:54 PM  Clinical Narrative:    CSW is continuing to monitor pt medical status. Pt is not medically stable for dc back to [facility] yet.    TOC will continue to follow.   Expected Discharge Plan: Skilled Nursing Facility Barriers to Discharge: Continued Medical Work up  Expected Discharge Plan and Services       Living arrangements for the past 2 months: Assisted Living Facility                                       Social Determinants of Health (SDOH) Interventions SDOH Screenings   Food Insecurity: Patient Unable To Answer (06/25/2022)  Housing: Patient Unable To Answer (06/25/2022)  Transportation Needs: Patient Unable To Answer (06/25/2022)  Utilities: Patient Unable To Answer (06/25/2022)  Tobacco Use: Low Risk  (06/20/2022)    Readmission Risk Interventions     No data to display        Oletta Lamas, MSW, LCSWA, LCASA Transitions of Care  Clinical Social Worker I

## 2022-06-28 NOTE — Progress Notes (Signed)
PROGRESS NOTE    Ann Cole  ZOX:[id number] DOB: 15-Oct-1940 DOA: 06/20/2022 PCP: Deeann Saint, MD   82/F  y.o. female with medical history significant of advanced dementia, chronic HFpEF, PAF not on systemic anticoagulation, hypothyroidism, HTN, chronic normocytic anemia, sent from nursing home for evaluation of worsening of dyspnea and lower extremity swelling.  Granddaughter reported that the patient was last seen normal last week. ED Course: Afebrile, VSS,  Chest x-ray showed cardiomegaly and mild pulmonary edema.  Blood work showed creatinine 1.0 K4.2 bicarb 24, hemoglobin 11.8.  Patient was given DuoNebs, IV ceftriaxone and IV azithromycin and IV Lasix. -Improved with diuretics, hospital course then complicated by leukocytosis and lethargy, hypotension, AKI -Slowly improving again -Palliative consulted 6/11    Subjective: -Feels fair, denies any complaints, unable to communicate clearly even with iPad interpreter  Assessment and Plan:  Acute on chronic HFpEF -Echo with preserved EF, mildly reduced RV, severely elevated PA pressures -Diuresed well on IV Lasix she is 9 L negative, weight down 30 LB -With soft BPs and AKI held losartan Lasix and Aldactone -Resume Lasix today, likely Aldactone tomorrow -Poor candidate for SGLT2i with relative immobility and prior UTIs -palliative consult for goals of care  Severe pulmonary hypertension -CTA chest negative for PE, moderate right pleural effusion noted, continue diuretics as above a -With dementia and poor functional status she is not a candidate for invasive workup -Discussed palliative care with granddaughter, will request goals of care meeting  Mild leukocytosis -?  Related to Foley catheter, UA is unremarkable, will attempt to remove catheter and monitor for retention  -Procalcitonin is normal, x-rays unremarkable -Afebrile and nontoxic, monitor clinically   Hypothyroidism -Stable, continue Synthroid    PAF -Rate controlled, not on systemic anticoagulation -continue aspirin   Advanced dementia -Stable, at risk for delirium  Debility   DVT prophylaxis: lovenox Code Status: Full Code per family Family Communication: No family at bedside, called and updated granddaughter again Disposition Plan: Back to SNF likely 1 to 2 days  Consultants:    Procedures:   Antimicrobials:    Objective: Vitals:   06/28/22 0148 06/28/22 0429 06/28/22 0824 06/28/22 0944  BP: (!) 96/52 (!) 114/53 (!) 121/58 (!) 108/50  Pulse: 83 82  84  Resp: 20 11 15    Temp: (!) 97.4 F (36.3 C) 97.9 F (36.6 C) (!) 97.5 F (36.4 C)   TempSrc: Axillary Axillary Axillary   SpO2: 97% 96% 98%   Weight:  92.1 kg    Height:        Intake/Output Summary (Last 24 hours) at 06/28/2022 1123 Last data filed at 06/28/2022 1054 Gross per 24 hour  Intake 1263 ml  Output 1950 ml  Net -687 ml   Filed Weights   06/26/22 0442 06/27/22 0439 06/28/22 0429  Weight: 88.3 kg 90.4 kg 92.1 kg    Examination:  Elderly chronically ill female laying in bed, slightly more awake today, oriented to self only, moderate cognitive deficits CVS: S1-S2, regular rhythm Lungs: Improved air movement anteriorly, decreased at the bases Abdomen: Soft, nontender, bowel sounds present Extremities: No edema  Data Reviewed:   CBC: Recent Labs  Lab 06/25/22 0115 06/26/22 0132 06/27/22 0107 06/27/22 1220 06/28/22 0045  WBC 11.1* 13.3* 14.2* 13.8* 13.3*  HGB 15.9* 15.7* 14.6 14.1 13.7  HCT 48.9* 48.4* 45.7 44.9 44.3  MCV 90.6 90.3 91.4 92.8 92.1  PLT 348 328 311 315 313   Basic Metabolic Panel: Recent Labs  Lab 06/22/22 0104 06/23/22 0155  06/24/22 0111 06/25/22 0115 06/26/22 0132 06/27/22 0107 06/28/22 0045  NA 136   < > 139 138 136 135 137  K 3.5   < > 3.8 4.3 4.7 4.0 4.0  CL 95*   < > 96* 97* 98 98 99  CO2 25   < > 30 26 25 25 28   GLUCOSE 105*   < > 111* 86 124* 108* 127*  BUN 20   < > 26* 21 40* 43* 29*   CREATININE 1.05*   < > 1.19* 1.22* 2.05* 1.91* 1.30*  CALCIUM 9.1   < > 9.5 9.8 9.7 9.2 9.5  MG 1.9  --   --   --   --   --   --    < > = values in this interval not displayed.   GFR: Estimated Creatinine Clearance: 32.6 mL/min (A) (by C-G formula based on SCr of 1.3 mg/dL (H)). Liver Function Tests: No results for input(s): "AST", "ALT", "ALKPHOS", "BILITOT", "PROT", "ALBUMIN" in the last 168 hours.  No results for input(s): "LIPASE", "AMYLASE" in the last 168 hours. No results for input(s): "AMMONIA" in the last 168 hours. Coagulation Profile: No results for input(s): "INR", "PROTIME" in the last 168 hours. Cardiac Enzymes: No results for input(s): "CKTOTAL", "CKMB", "CKMBINDEX", "TROPONINI" in the last 168 hours. BNP (last 3 results) No results for input(s): "PROBNP" in the last 8760 hours. HbA1C: No results for input(s): "HGBA1C" in the last 72 hours. CBG: No results for input(s): "GLUCAP" in the last 168 hours.  Lipid Profile: No results for input(s): "CHOL", "HDL", "LDLCALC", "TRIG", "CHOLHDL", "LDLDIRECT" in the last 72 hours. Thyroid Function Tests: No results for input(s): "TSH", "T4TOTAL", "FREET4", "T3FREE", "THYROIDAB" in the last 72 hours. Anemia Panel: No results for input(s): "VITAMINB12", "FOLATE", "FERRITIN", "TIBC", "IRON", "RETICCTPCT" in the last 72 hours. Urine analysis:    Component Value Date/Time   COLORURINE YELLOW 06/26/2022 1250   APPEARANCEUR CLEAR 06/26/2022 1250   LABSPEC 1.017 06/26/2022 1250   PHURINE 5.0 06/26/2022 1250   GLUCOSEU NEGATIVE 06/26/2022 1250   HGBUR NEGATIVE 06/26/2022 1250   BILIRUBINUR NEGATIVE 06/26/2022 1250   KETONESUR NEGATIVE 06/26/2022 1250   PROTEINUR NEGATIVE 06/26/2022 1250   NITRITE NEGATIVE 06/26/2022 1250   LEUKOCYTESUR TRACE (A) 06/26/2022 1250   Sepsis Labs: @LABRCNTIP (procalcitonin:4,lacticidven:4)  ) Recent Results (from the past 240 hour(s))  SARS Coronavirus 2 by RT PCR (hospital order, performed in  [facility] hospital lab) *cepheid single result test* Anterior Nasal Swab     Status: None   Collection Time: 06/20/22 11:26 AM   Specimen: Anterior Nasal Swab  Result Value Ref Range Status   SARS Coronavirus 2 by RT PCR NEGATIVE NEGATIVE Final    Comment: Performed at [facility], [address] [place], [place] [address]  Urine Culture (for pregnant, neutropenic or urologic patients or patients with an indwelling urinary catheter)     Status: Abnormal (Preliminary result)   Collection Time: 06/26/22  6:47 AM   Specimen: Urine, Clean Catch  Result Value Ref Range Status   Specimen Description URINE, CLEAN CATCH  Final   Special Requests NONE  Final   Culture (A)  Final    10,000 COLONIES/mL ENTEROCOCCUS FAECALIS SUSCEPTIBILITIES TO FOLLOW Performed at [facility], [address], [place], [place] [address]    Report Status PENDING  Incomplete     Radiology Studies: DG CHEST PORT 1 VIEW  Result Date: 06/27/2022 CLINICAL DATA:  Cough EXAM: PORTABLE CHEST 1 VIEW  COMPARISON:  06/22/2022 CT FINDINGS: Cardiac shadow is enlarged. Aortic calcifications are noted. Previously seen right-sided pleural effusion is not well seen on this exam. No focal infiltrate is noted. IMPRESSION: No acute abnormality noted. Electronically Signed   By: Alcide Clever M.D.   On: 06/27/2022 11:09     Scheduled Meds:  acetaminophen  500 mg Oral Q8H   aspirin EC  81 mg Oral Daily   Chlorhexidine Gluconate Cloth  6 each Topical Daily   enoxaparin (LOVENOX) injection  30 mg Subcutaneous Q24H   feeding supplement  237 mL Oral BID BM   furosemide  40 mg Oral Daily   levothyroxine  100 mcg Oral Daily   melatonin  5 mg Oral QHS   memantine  5 mg Oral BID   metoprolol succinate  50 mg Oral Daily   sertraline  50 mg Oral q morning   sodium chloride flush  3 mL Intravenous Q12H   Continuous Infusions:     LOS: 8 days    Time spent:    Zannie Cove, MD [facility]   06/28/2022, 11:23 AM

## 2022-06-29 DIAGNOSIS — Z7189 Other specified counseling: Secondary | ICD-10-CM | POA: Diagnosis not present

## 2022-06-29 DIAGNOSIS — I5033 Acute on chronic diastolic (congestive) heart failure: Secondary | ICD-10-CM | POA: Diagnosis not present

## 2022-06-29 LAB — URINE CULTURE: Culture: 10000 — AB

## 2022-06-29 LAB — CBC
HCT: 45.4 % (ref 36.0–46.0)
Hemoglobin: 14.7 g/dL (ref 12.0–15.0)
MCH: 29.4 pg (ref 26.0–34.0)
MCHC: 32.4 g/dL (ref 30.0–36.0)
MCV: 90.8 fL (ref 80.0–100.0)
Platelets: 300 10*3/uL (ref 150–400)
RBC: 5 MIL/uL (ref 3.87–5.11)
RDW: 16 % — ABNORMAL HIGH (ref 11.5–15.5)
WBC: 13.1 10*3/uL — ABNORMAL HIGH (ref 4.0–10.5)
nRBC: 0 % (ref 0.0–0.2)

## 2022-06-29 LAB — BASIC METABOLIC PANEL
Anion gap: 9 (ref 5–15)
BUN: 26 mg/dL — ABNORMAL HIGH (ref 8–23)
CO2: 25 mmol/L (ref 22–32)
Calcium: 9.4 mg/dL (ref 8.9–10.3)
Chloride: 101 mmol/L (ref 98–111)
Creatinine, Ser: 1.2 mg/dL — ABNORMAL HIGH (ref 0.44–1.00)
GFR, Estimated: 45 mL/min — ABNORMAL LOW (ref >60)
Glucose, Bld: 115 mg/dL — ABNORMAL HIGH (ref 70–99)
Potassium: 4.2 mmol/L (ref 3.5–5.1)
Sodium: 135 mmol/L (ref 135–145)

## 2022-06-29 MED ORDER — SPIRONOLACTONE 25 MG PO TABS
25.0000 mg | ORAL_TABLET | Freq: Every day | ORAL | Status: DC
  Administered 2022-06-29: 25 mg via ORAL
  Filled 2022-06-29: qty 1

## 2022-06-29 MED ORDER — SPIRONOLACTONE 25 MG PO TABS: 25.0000 mg | ORAL_TABLET | Freq: Every day | ORAL | 0 refills | Status: AC

## 2022-06-29 MED ORDER — METOPROLOL SUCCINATE ER 50 MG PO TB24: 50.0000 mg | ORAL_TABLET | Freq: Every day | ORAL | 0 refills | Status: AC

## 2022-06-29 MED ORDER — FUROSEMIDE 40 MG PO TABS: 40.0000 mg | ORAL_TABLET | Freq: Every day | ORAL | Status: AC

## 2022-06-29 MED ORDER — AMOXICILLIN 250 MG PO CAPS
250.0000 mg | ORAL_CAPSULE | Freq: Three times a day (TID) | ORAL | Status: DC
  Administered 2022-06-29: 250 mg via ORAL
  Filled 2022-06-29 (×4): qty 1

## 2022-06-29 NOTE — Discharge Summary (Addendum)
Physician Discharge Summary  Ann Cole ZOX:[id number] DOB: Feb 02, 1940 DOA: 06/20/2022  PCP: Deeann Saint, MD  Admit date: 06/20/2022 Discharge date: 06/29/2022  Time spent: 45 minutes  Recommendations for Outpatient Follow-up:  Back to SNF for long-term care Outpatient palliative care follow-up   Discharge Diagnoses:  Principal Problem:   Acute on chronic diastolic CHF (congestive heart failure) (HCC) Severe pulmonary hypertension   Essential hypertension   Paroxysmal atrial fibrillation (HCC)   Dementia (HCC)   Class 3 obesity (HCC)   Hypothyroidism Significant debility  Discharge Condition: Stable  Diet recommendation: Low-sodium, heart healthy  Filed Weights   06/27/22 0439 06/28/22 0429 06/29/22 0445  Weight: 90.4 kg 92.1 kg 91.1 kg    History of present illness:   82/F  y.o. female with medical history significant of advanced dementia, chronic HFpEF, PAF not on systemic anticoagulation, hypothyroidism, HTN, chronic normocytic anemia, sent from nursing home for evaluation of worsening of dyspnea and lower extremity swelling.  Granddaughter reported that the patient was last seen normal last week. ED Course: Afebrile, VSS,  Chest x-ray showed cardiomegaly and mild pulmonary edema.  Blood work showed creatinine 1.0 K4.2 bicarb 24, hemoglobin 11.8.  Patient was given DuoNebs, IV ceftriaxone and IV azithromycin and IV Lasix. -Improved with diuretics, hospital course then complicated by leukocytosis and lethargy, hypotension, AKI -Slowly improving again -Palliative consulted 6/11   Hospital Course:   Acute on chronic HFpEF -Echo with preserved EF, mildly reduced RV, severely elevated PA pressures -Diuresed well on IV Lasix she is 8 L negative, weight down 25 LB -Then complicated by AKI and low blood pressure, held losartan Lasix and Aldactone -Now stable, resumed oral Lasix and Aldactone -Poor candidate for SGLT2i with relative immobility and prior  UTIs -palliative consulted for goals of care, will discharge back to SNF for long-term care   Severe pulmonary hypertension -CTA chest negative for PE, moderate right pleural effusion noted, continue diuretics as above a -With dementia and poor functional status she is not a candidate for invasive workup -Discussed palliative care with granddaughter, goals of care meeting completed, plan to continue current care with outpatient palliative care follow-up, will need further discussions   Mild leukocytosis Enterococcus UTI -?  Related to Foley catheter, urine culture grew 10,000 colonies of Enterococcus, on account of leukocytosis and mild lethargy few days ago we decided to treat with amoxicillin for 3 days, Foley catheter has been removed   Hypothyroidism -Stable, continue Synthroid   PAF -Rate controlled, not on systemic anticoagulation -continue aspirin   Advanced dementia -Stable, at risk for delirium   Debility  Discharge Exam: Vitals:   06/29/22 0445 06/29/22 0924  BP: (!) 129/50 (!) 123/51  Pulse: 80 82  Resp: 20 17  Temp:  (!) 96.6 F (35.9 C)  SpO2: 99% 99%   Elderly chronically ill female laying in bed, awake alert , oriented to self only, moderate cognitive deficits CVS: S1-S2, regular rhythm Lungs: Improved air movement anteriorly, decreased at the bases Abdomen: Soft, nontender, bowel sounds present Extremities: No edema    Discharge Instructions    Allergies as of 06/29/2022   No Known Allergies      Medication List     STOP taking these medications    alum & mag hydroxide-simeth 200-200-20 MG/5ML suspension Commonly known as: MAALOX/MYLANTA   guaifenesin 100 MG/5ML syrup Commonly known as: ROBITUSSIN   loperamide 2 MG capsule Commonly known as: IMODIUM       TAKE these medications  acetaminophen 500 MG tablet Commonly known as: TYLENOL Take 500 mg by mouth in the morning, at noon, and at bedtime.   aspirin EC 325 MG tablet Take 1  tablet (325 mg total) by mouth daily.   Cholecalciferol 50 MCG (2000 UT) Caps Take 2,000 Units by mouth daily.   ferrous sulfate 325 (65 FE) MG tablet Take 1 tablet (325 mg total) by mouth daily with breakfast.   furosemide 40 MG tablet Commonly known as: LASIX Take 1 tablet (40 mg total) by mouth daily. Start taking on: June 30, 2022   levothyroxine 25 MCG tablet Commonly known as: SYNTHROID Take 25 mcg by mouth See admin instructions. Take one tablet (25 mcg) by mouth on Mondays before breakfast (with a 100 mcg tablet for a total dose of 125 mcg). Take 100 mcg on all other days of the week What changed: Another medication with the same name was removed. Continue taking this medication, and follow the directions you see here.   levothyroxine 100 MCG tablet Commonly known as: SYNTHROID Take 100 mcg by mouth See admin instructions. Take one tablet (100 mcg) by mouth daily before breakfast (on Mondays also take 25 mcg tablet) What changed: Another medication with the same name was removed. Continue taking this medication, and follow the directions you see here.   magnesium hydroxide 400 MG/5ML suspension Commonly known as: MILK OF MAGNESIA Take 30 mLs by mouth at bedtime as needed (constipation).   melatonin 5 MG Tabs Take 5 mg by mouth at bedtime.   memantine 5 MG tablet Commonly known as: NAMENDA Take 1 tablet (5 mg total) by mouth 2 (two) times daily.   metoprolol succinate 50 MG 24 hr tablet Commonly known as: TOPROL-XL Take 1 tablet (50 mg total) by mouth daily. Take with or immediately following a meal. What changed:  medication strength how much to take   sertraline 50 MG tablet Commonly known as: ZOLOFT Take 50 mg by mouth every morning.   spironolactone 25 MG tablet Commonly known as: ALDACTONE Take 1 tablet (25 mg total) by mouth daily. Start taking on: June 30, 2022   vitamin C 250 MG tablet Commonly known as: ASCORBIC ACID Take 250 mg by mouth daily.        No Known Allergies    The results of significant diagnostics from this hospitalization (including imaging, microbiology, ancillary and laboratory) are listed below for reference.    Significant Diagnostic Studies: DG CHEST PORT 1 VIEW  Result Date: 06/27/2022 CLINICAL DATA:  Cough EXAM: PORTABLE CHEST 1 VIEW COMPARISON:  06/22/2022 CT FINDINGS: Cardiac shadow is enlarged. Aortic calcifications are noted. Previously seen right-sided pleural effusion is not well seen on this exam. No focal infiltrate is noted. IMPRESSION: No acute abnormality noted. Electronically Signed   By: Alcide Clever M.D.   On: 06/27/2022 11:09   VAS [place] LOWER EXTREMITY VENOUS (DVT)  Result Date: 06/22/2022  Lower Venous DVT Study Patient Name:  RIETA LEHN  Date of Exam:   06/22/2022 Medical Rec #: [id number]              Accession #:    [id number] Date of Birth: [birth year]              Patient Gender: F Patient Age:   63 years Exam Location:  [facility] Procedure:      VAS [place] LOWER EXTREMITY VENOUS (DVT) Referring Phys: Mikey College --------------------------------------------------------------------------------  Limitations: Altered mental status, patient pain and constant movement, repeated unsolicited  valsalva maneuvers, guarding. Comparison Study: 06-07-2022 Prior bilateral lower extremity venous study was                   negative for DVT. Performing Technologist: Jean Rosenthal RDMS, RVT  Examination Guidelines: A complete evaluation includes B-mode imaging, spectral Doppler, color Doppler, and power Doppler as needed of all accessible portions of each vessel. Bilateral testing is considered an integral part of a complete examination. Limited examinations for reoccurring indications may be performed as noted. The reflux portion of the exam is performed with the patient in reverse Trendelenburg.  +---------+---------------+---------+-----------+----------+-------------------+ RIGHT     CompressibilityPhasicitySpontaneityPropertiesThrombus Aging      +---------+---------------+---------+-----------+----------+-------------------+ CFV      Full           Yes      Yes                                      +---------+---------------+---------+-----------+----------+-------------------+ SFJ      Full                                                             +---------+---------------+---------+-----------+----------+-------------------+ FV Prox  Full                                                             +---------+---------------+---------+-----------+----------+-------------------+ FV Mid                  Yes      Yes                  Patent by color     +---------+---------------+---------+-----------+----------+-------------------+ FV Distal                                             Unable to visualize                                                       -see limitations    +---------+---------------+---------+-----------+----------+-------------------+ PFV      Full                                                             +---------+---------------+---------+-----------+----------+-------------------+ POP      Full           Yes      Yes                                      +---------+---------------+---------+-----------+----------+-------------------+  PTV      Full                                                             +---------+---------------+---------+-----------+----------+-------------------+ PERO     Full                                                             +---------+---------------+---------+-----------+----------+-------------------+   +----+---------------+---------+-----------+----------+--------------+ LEFTCompressibilityPhasicitySpontaneityPropertiesThrombus Aging +----+---------------+---------+-----------+----------+--------------+ CFV Full           Yes      Yes                                  +----+---------------+---------+-----------+----------+--------------+     Summary: RIGHT: - There is no evidence of deep vein thrombosis in the lower extremity. However, portions of this examination were limited- see technologist comments above.  - No cystic structure found in the popliteal fossa.  LEFT: - No evidence of common femoral vein obstruction.  *See table(s) above for measurements and observations. Electronically signed by Heath Lark on 06/22/2022 at 6:07:22 PM.    Final    CT Angio Chest Pulmonary Embolism (PE) W or WO Contrast  Result Date: 06/22/2022 CLINICAL DATA:  Dyspnea, suspected pulmonary embolism. EXAM: CT ANGIOGRAPHY CHEST WITH CONTRAST TECHNIQUE: Multidetector CT imaging of the chest was performed using the standard protocol during bolus administration of intravenous contrast. Multiplanar CT image reconstructions and MIPs were obtained to evaluate the vascular anatomy. RADIATION DOSE REDUCTION: This exam was performed according to the departmental dose-optimization program which includes automated exposure control, adjustment of the mA and/or kV according to patient size and/or use of iterative reconstruction technique. CONTRAST:  75mL OMNIPAQUE IOHEXOL 350 MG/ML SOLN COMPARISON:  August 23, 2018. FINDINGS: Cardiovascular: Satisfactory opacification of the pulmonary arteries to the segmental level. No evidence of pulmonary embolism. Mild cardiomegaly. No pericardial effusion. Mediastinum/Nodes: No enlarged mediastinal, hilar, or axillary lymph nodes. Thyroid gland, trachea, and esophagus demonstrate no significant findings. Lungs/Pleura: No pneumothorax is noted. Moderate size right pleural effusion is noted with adjacent subsegmental atelectasis of the right lower lobe. Left lung is unremarkable. Upper Abdomen: No acute abnormality. Musculoskeletal: No chest wall abnormality. No acute or significant osseous findings. Review of the MIP images confirms the above  findings. IMPRESSION: No definite evidence of pulmonary embolus. Moderate right pleural effusion is noted with adjacent subsegmental atelectasis of right lower lobe. Aortic Atherosclerosis (ICD10-I70.0). Electronically Signed   By: Lupita Raider M.D.   On: 06/22/2022 15:52   ECHOCARDIOGRAM COMPLETE  Result Date: 06/22/2022    ECHOCARDIOGRAM REPORT   Patient Name:   Ann Cole Date of Exam: 06/22/2022 Medical Rec #:  [id number]             Height:       60.0 in Accession #:    [id number]            Weight:       216.7 lb Date of Birth:  12/13/1940  BSA:          1.931 m Patient Age:    82 years              BP:           134/71 mmHg Patient Gender: F                     HR:           85 bpm. Exam Location:  Inpatient Procedure: 2D Echo, Cardiac Doppler and Color Doppler Indications:    acute diastolic chf  History:        Patient has prior history of Echocardiogram examinations, most                 recent 08/24/2018. CHF, Arrythmias:Atrial Fibrillation,                 Signs/Symptoms:Edema; Risk Factors:Hypertension and                 Dyslipidemia.  Sonographer:    Delcie Roch RDCS Referring Phys: [id number] [facility]  Sonographer Comments: Image acquisition challenging due to uncooperative patient, Image acquisition challenging due to patient body habitus and Dementia. Spanish speaking. IMPRESSIONS  1. Left ventricular ejection fraction, by estimation, is 60 to 65%. The left ventricle has normal function. The left ventricle has no regional wall motion abnormalities. Left ventricular diastolic function could not be evaluated.  2. Right ventricular systolic function is mildly reduced. The right ventricular size is normal. There is severely elevated pulmonary artery systolic pressure. The estimated right ventricular systolic pressure is 67.0 mmHg.  3. Left atrial size was moderately dilated.  4. Right atrial size was mild to moderately dilated.  5. The mitral valve is degenerative. Trivial  mitral valve regurgitation. No evidence of mitral stenosis.  6. Tricuspid valve regurgitation is mild to moderate.  7. The aortic valve is normal in structure. Aortic valve regurgitation is mild to moderate. Moderate aortic valve stenosis. Aortic regurgitation PHT measures 328 msec. Aortic valve area, by VTI measures 0.82 cm. Aortic valve mean gradient measures 13.0  mmHg. Aortic valve Vmax measures 2.47 m/s. Although the mean AVG and Vmax are in the mild range for AS, the SVI is low at 20 and DVI is 0.32. 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 The inferior vena cava is dilated in size with >50% respiratory variability, suggesting right atrial pressure of 8 mmHg. FINDINGS  Left Ventricle: Left ventricular ejection fraction, by estimation, is 60 to 65%. The left ventricle has normal function. The left ventricle has no regional wall motion abnormalities. The left ventricular internal cavity size was normal in size. There is  no left ventricular hypertrophy. Left ventricular diastolic function could not be evaluated. Right Ventricle: The right ventricular size is normal. No increase in right ventricular wall thickness. Right ventricular systolic function is mildly reduced. There is severely elevated pulmonary artery systolic pressure. The tricuspid regurgitant velocity is 3.84 m/s, and with an assumed right atrial pressure of 8 mmHg, the estimated right ventricular systolic pressure is 67.0 mmHg. Left Atrium: Left atrial size was moderately dilated. Right Atrium: Right atrial size was mild to moderately dilated. Pericardium: There is no evidence of pericardial effusion. Mitral Valve: The mitral valve is degenerative in appearance. There is mild calcification of the mitral valve leaflet(s). Mild mitral annular calcification. Trivial mitral valve regurgitation. No evidence of mitral valve stenosis. Tricuspid Valve: The tricuspid valve is normal  in structure. Tricuspid valve  regurgitation is mild to moderate. No evidence of tricuspid stenosis. Aortic Valve: The aortic valve is normal in structure. Aortic valve regurgitation is mild to moderate. Aortic regurgitation PHT measures 328 msec. Moderate aortic stenosis is present. Aortic valve mean gradient measures 13.0 mmHg. Aortic valve peak gradient measures 24.4 mmHg. Aortic valve area, by VTI measures 0.82 cm. Pulmonic Valve: The pulmonic valve was normal in structure. Pulmonic valve regurgitation is not visualized. No evidence of pulmonic stenosis. Aorta: The aortic root is normal in size and structure. Venous: The inferior vena cava is dilated in size with greater than 50% respiratory variability, suggesting right atrial pressure of 8 mmHg. IAS/Shunts: No atrial level shunt detected by color flow Doppler.  LEFT VENTRICLE PLAX 2D LVIDd:         4.00 cm LVIDs:         2.90 cm LV PW:         1.00 cm LV IVS:        0.90 cm LVOT diam:     1.80 cm LV SV:         38 LV SV Index:   20 LVOT Area:     2.54 cm  RIGHT VENTRICLE            IVC RV S prime:     9.14 cm/s  IVC diam: 2.30 cm TAPSE (M-mode): 1.5 cm LEFT ATRIUM             Index        RIGHT ATRIUM           Index LA diam:        3.40 cm 1.76 cm/m   RA Area:     20.20 cm LA Vol (A2C):   89.6 ml 46.39 ml/m  RA Volume:   60.40 ml  31.27 ml/m LA Vol (A4C):   76.4 ml 39.56 ml/m LA Biplane Vol: 82.6 ml 42.77 ml/m  AORTIC VALVE AV Area (Vmax):    0.76 cm AV Area (Vmean):   0.76 cm AV Area (VTI):     0.82 cm AV Vmax:           247.00 cm/s AV Vmean:          167.000 cm/s AV VTI:            0.462 m AV Peak Grad:      24.4 mmHg AV Mean Grad:      13.0 mmHg LVOT Vmax:         73.60 cm/s LVOT Vmean:        49.850 cm/s LVOT VTI:          0.149 m LVOT/AV VTI ratio: 0.32 AI PHT:            328 msec  AORTA Ao Root diam: 2.60 cm Ao Asc diam:  2.80 cm MR Peak grad: 126.8 mmHg  TRICUSPID VALVE MR Mean grad: 75.0 mmHg   TR Peak grad:   59.0 mmHg MR Vmax:      563.00 cm/s TR Vmax:        384.00  cm/s MR Vmean:     404.0 cm/s                           SHUNTS                           Systemic VTI:  0.15  m                           Systemic Diam: 1.80 cm Armanda Magic MD Electronically signed by Armanda Magic MD Signature Date/Time: 06/22/2022/11:13:58 AM    Final    DG Chest Port 1 View  Result Date: 06/20/2022 CLINICAL DATA:  Dyspnea EXAM: PORTABLE CHEST 1 VIEW COMPARISON:  Chest radiograph dated 06/07/2022 FINDINGS: Low lung volumes with bronchovascular crowding. Mild interstitial opacities. Bibasilar patchy opacities. No pleural effusion or pneumothorax. Similar enlarged cardiomediastinal silhouette. Similar deformity of the right humeral head. IMPRESSION: 1. Low lung volumes with bronchovascular crowding. Mild interstitial opacities, which may represent pulmonary edema. 2. Bibasilar patchy opacities, likely atelectasis. Electronically Signed   By: Agustin Cree M.D.   On: 06/20/2022 12:23   VAS [place] LOWER EXTREMITY VENOUS (DVT) (ONLY [facility] & [facility])  Result Date: 06/07/2022  Lower Venous DVT Study Patient Name:  STEPHANE CRISTEA  Date of Exam:   06/07/2022 Medical Rec #: [id number]              Accession #:    [id number] Date of Birth: June 28, 1940              Patient Gender: F Patient Age:   19 years Exam Location:  [facility] Procedure:      VAS [place] LOWER EXTREMITY VENOUS (DVT) Referring Phys: Almeta Monas COUNTRYMAN --------------------------------------------------------------------------------  Indications: Swelling, and Pain.  Limitations: Body habitus, poor ultrasound/tissue interface and Patient movement. Comparison Study: No previous study. Performing Technologist: McKayla Maag RVT, VT  Examination Guidelines: A complete evaluation includes B-mode imaging, spectral Doppler, color Doppler, and power Doppler as needed of all accessible portions of each vessel. Bilateral testing is considered an integral part of a complete examination. Limited examinations for reoccurring indications may be performed as  noted. The reflux portion of the exam is performed with the patient in reverse Trendelenburg.  +-------+---------------+---------+-----------+----------+----------------+ RIGHT  CompressibilityPhasicitySpontaneityPropertiesThrombus Aging   +-------+---------------+---------+-----------+----------+----------------+ CFV                   Yes      Yes                  Patent by color. +-------+---------------+---------+-----------+----------+----------------+ SFJ                   Yes      Yes                  Patent by color. +-------+---------------+---------+-----------+----------+----------------+ FV Prox               Yes      Yes                  Patent by color. +-------+---------------+---------+-----------+----------+----------------+ FV Mid                Yes      Yes                  Patent by color. +-------+---------------+---------+-----------+----------+----------------+ PFV                   Yes      Yes                  Patent by color. +-------+---------------+---------+-----------+----------+----------------+ POP    Full           Yes      Yes                                   +-------+---------------+---------+-----------+----------+----------------+  PTV                                                 Not visualized   +-------+---------------+---------+-----------+----------+----------------+ PERO                                                Not visualized   +-------+---------------+---------+-----------+----------+----------------+   +---------+---------------+---------+-----------+----------+----------------+ LEFT     CompressibilityPhasicitySpontaneityPropertiesThrombus Aging   +---------+---------------+---------+-----------+----------+----------------+ CFV                     Yes      Yes                  Patent by color. +---------+---------------+---------+-----------+----------+----------------+ SFJ                      Yes      Yes                  Patent by color. +---------+---------------+---------+-----------+----------+----------------+ FV Prox                 Yes      Yes                  Patent by color. +---------+---------------+---------+-----------+----------+----------------+ FV Mid                  Yes      Yes                  Patent by color. +---------+---------------+---------+-----------+----------+----------------+ FV Distal                                             Not visualized   +---------+---------------+---------+-----------+----------+----------------+ PFV                     Yes      Yes                  Patent by color. +---------+---------------+---------+-----------+----------+----------------+ POP                     Yes      Yes                  Patent by color. +---------+---------------+---------+-----------+----------+----------------+ PTV                                                   Not visualized   +---------+---------------+---------+-----------+----------+----------------+ PERO                                                  Not visualized   +---------+---------------+---------+-----------+----------+----------------+     Summary: RIGHT: - There is no evidence of deep vein thrombosis in the lower extremity. However, portions of this examination were limited- see technologist  comments above.  - No cystic structure found in the popliteal fossa.  LEFT: - There is no evidence of deep vein thrombosis in the lower extremity. However, portions of this examination were limited- see technologist comments above.  - No cystic structure found in the popliteal fossa.  *See table(s) above for measurements and observations. Electronically signed by Coral Else MD on 06/07/2022 at 7:19:03 PM.    Final    CT HEAD WO CONTRAST ( )  Result Date: 06/07/2022 CLINICAL DATA:  Head trauma altered EXAM: CT HEAD WITHOUT CONTRAST CT CERVICAL  SPINE WITHOUT CONTRAST TECHNIQUE: Multidetector CT imaging of the head and cervical spine was performed following the standard protocol without intravenous contrast. Multiplanar CT image reconstructions of the cervical spine were also generated. RADIATION DOSE REDUCTION: This exam was performed according to the departmental dose-optimization program which includes automated exposure control, adjustment of the mA and/or kV according to patient size and/or use of iterative reconstruction technique. COMPARISON:  None Available. FINDINGS: CT HEAD FINDINGS Brain: Degraded by artifact, motion, and suboptimal positioning. No gross territorial infarction, hemorrhage or intracranial mass. Moderate atrophy. Moderate chronic small vessel ischemic changes of the white matter. Prominent ventricles felt secondary to atrophy. Posterior fossa is incompletely included on head CT. Vascular: Carotid vascular calcification Skull: Normal. Negative for fracture or focal lesion. Sinuses/Orbits: No acute finding. Other: None CT CERVICAL SPINE FINDINGS Alignment: Straightening of the cervical spine. No subluxation. Facet alignment is within normal limits. Skull base and vertebrae: No acute fracture. No primary bone lesion or focal pathologic process. Soft tissues and spinal canal: No prevertebral fluid or swelling. No visible canal hematoma. Disc levels: Multilevel degenerative changes. Moderate disc space narrowing and degenerative change C4-C5, C5-C6 and C6-C7. Facet degenerative changes at multiple levels with foraminal narrowing Upper chest: Negative. Other: None IMPRESSION: Degraded exam secondary to artifact, motion and suboptimal positioning. No definite acute intracranial abnormality. Atrophy and chronic small vessel ischemic changes of the white matter. Straightening of the cervical spine with degenerative changes. No acute osseous abnormality. Electronically Signed   By: Jasmine Pang M.D.   On: 06/07/2022 17:19   CT CERVICAL  SPINE WO CONTRAST  Result Date: 06/07/2022 CLINICAL DATA:  Head trauma altered EXAM: CT HEAD WITHOUT CONTRAST CT CERVICAL SPINE WITHOUT CONTRAST TECHNIQUE: Multidetector CT imaging of the head and cervical spine was performed following the standard protocol without intravenous contrast. Multiplanar CT image reconstructions of the cervical spine were also generated. RADIATION DOSE REDUCTION: This exam was performed according to the departmental dose-optimization program which includes automated exposure control, adjustment of the mA and/or kV according to patient size and/or use of iterative reconstruction technique. COMPARISON:  None Available. FINDINGS: CT HEAD FINDINGS Brain: Degraded by artifact, motion, and suboptimal positioning. No gross territorial infarction, hemorrhage or intracranial mass. Moderate atrophy. Moderate chronic small vessel ischemic changes of the white matter. Prominent ventricles felt secondary to atrophy. Posterior fossa is incompletely included on head CT. Vascular: Carotid vascular calcification Skull: Normal. Negative for fracture or focal lesion. Sinuses/Orbits: No acute finding. Other: None CT CERVICAL SPINE FINDINGS Alignment: Straightening of the cervical spine. No subluxation. Facet alignment is within normal limits. Skull base and vertebrae: No acute fracture. No primary bone lesion or focal pathologic process. Soft tissues and spinal canal: No prevertebral fluid or swelling. No visible canal hematoma. Disc levels: Multilevel degenerative changes. Moderate disc space narrowing and degenerative change C4-C5, C5-C6 and C6-C7. Facet degenerative changes at multiple levels with foraminal narrowing Upper chest: Negative. Other: None IMPRESSION: Degraded  exam secondary to artifact, motion and suboptimal positioning. No definite acute intracranial abnormality. Atrophy and chronic small vessel ischemic changes of the white matter. Straightening of the cervical spine with degenerative  changes. No acute osseous abnormality. Electronically Signed   By: Jasmine Pang M.D.   On: 06/07/2022 17:19   DG Pelvis Portable  Result Date: 06/07/2022 CLINICAL DATA:  Fall with leg swelling EXAM: PORTABLE PELVIS 1 VIEWS COMPARISON:  Left hips and pelvis radiograph dated 08/23/2018 FINDINGS: There is no evidence of pelvic fracture or diastasis. No pelvic bone lesions are seen. IMPRESSION: No acute fracture or dislocation. Electronically Signed   By: Agustin Cree M.D.   On: 06/07/2022 16:37   DG Chest Portable 1 View  Result Date: 06/07/2022 CLINICAL DATA:  Fall with leg swelling EXAM: PORTABLE CHEST 1 VIEW COMPARISON:  Chest radiograph dated 04/26/2021 FINDINGS: Normal lung volumes. No focal consolidations. No pleural effusion or pneumothorax. Similar cardiomediastinal silhouette. Unchanged deformity of the proximal right humerus. IMPRESSION: No active disease. Electronically Signed   By: Agustin Cree M.D.   On: 06/07/2022 16:35    Microbiology: Recent Results (from the past 240 hour(s))  SARS Coronavirus 2 by RT PCR (hospital order, performed in [facility] hospital lab) *cepheid single result test* Anterior Nasal Swab     Status: None   Collection Time: 06/20/22 11:26 AM   Specimen: Anterior Nasal Swab  Result Value Ref Range Status   SARS Coronavirus 2 by RT PCR NEGATIVE NEGATIVE Final    Comment: Performed at [place] [facility], [address], [place], [place] [address]  Urine Culture (for pregnant, neutropenic or urologic patients or patients with an indwelling urinary catheter)     Status: Abnormal   Collection Time: 06/26/22  6:47 AM   Specimen: Urine, Clean Catch  Result Value Ref Range Status   Specimen Description URINE, CLEAN CATCH  Final   Special Requests   Final    NONE Performed at [facility], [address], [place], [place] [address]    Culture 10,000 COLONIES/mL ENTEROCOCCUS FAECALIS (A)  Final   Report Status 06/29/2022 FINAL  Final   Organism ID, Bacteria  ENTEROCOCCUS FAECALIS (A)  Final      Susceptibility   Enterococcus faecalis - MIC*    AMPICILLIN <=2 SENSITIVE Sensitive     NITROFURANTOIN <=16 SENSITIVE Sensitive     VANCOMYCIN 1 SENSITIVE Sensitive     * 10,000 COLONIES/mL ENTEROCOCCUS FAECALIS     Labs: Basic Metabolic Panel: Recent Labs  Lab 06/25/22 0115 06/26/22 0132 06/27/22 0107 06/28/22 0045 06/29/22 0017  NA 138 136 135 137 135  K 4.3 4.7 4.0 4.0 4.2  CL 97* 98 98 99 101  CO2 26 25 25 28 25   GLUCOSE 86 124* 108* 127* 115*  BUN 21 40* 43* 29* 26*  CREATININE 1.22* 2.05* 1.91* 1.30* 1.20*  CALCIUM 9.8 9.7 9.2 9.5 9.4   Liver Function Tests: No results for input(s): "AST", "ALT", "ALKPHOS", "BILITOT", "PROT", "ALBUMIN" in the last 168 hours. No results for input(s): "LIPASE", "AMYLASE" in the last 168 hours. No results for input(s): "AMMONIA" in the last 168 hours. CBC: Recent Labs  Lab 06/26/22 0132 06/27/22 0107 06/27/22 1220 06/28/22 0045 06/29/22 0017  WBC 13.3* 14.2* 13.8* 13.3* 13.1*  HGB 15.7* 14.6 14.1 13.7 14.7  HCT 48.4* 45.7 44.9 44.3 45.4  MCV 90.3 91.4 92.8 92.1 90.8  PLT 328 311 315 313 300   Cardiac Enzymes: No results for input(s): "CKTOTAL", "CKMB", "  CKMBINDEX", "TROPONINI" in the last 168 hours. BNP: BNP (last 3 results) Recent Labs    06/07/22 1645 06/20/22 1105  BNP 373.1* 327.4*    ProBNP (last 3 results) No results for input(s): "PROBNP" in the last 8760 hours.  CBG: No results for input(s): "GLUCAP" in the last 168 hours.     Signed:  Zannie Cove MD.  [facility] 06/29/2022, 10:06 AM

## 2022-06-29 NOTE — Consult Note (Signed)
Consultation Note Date: 06/29/2022   Patient Name: Ann Cole  DOB: 1940-05-28  MRN: [id number]  Age / Sex: 82 y.o., female  PCP: Deeann Saint, MD Referring Physician: Zannie Cove, MD  Reason for Consultation: Establishing goals of care  HPI/Patient Profile: 82 y.o. female  with past medical history of  advanced dementia, chronic HFpEF, PAF not on systemic anticoagulation, hypothyroidism, HTN, chronic normocytic anemia  admitted on 06/20/2022 with worsening of dyspnea and lower extremity swelling.   Patient admitted with acute on chronic HFpEF, UTI. Hospitalization was complicated by AKI and hypotension. PMT has been consulted to assist with goals of care conversation.  Clinical Assessment and Goals of Care:  I have reviewed medical records including EPIC notes, labs and imaging, assessed the patient and then had a phone conversation with patient's granddaughter [name] to discuss diagnosis prognosis, GOC, EOL wishes, disposition and options.  I introduced Palliative Medicine as specialized medical care for people living with serious illness. It focuses on providing relief from the symptoms and stress of a serious illness. The goal is to improve quality of life for both the patient and the family.  We discussed a brief life review of the patient and then focused on their current illness.   I attempted to elicit values and goals of care important to the patient.    Medical History Review and Understanding:  Patient's granddaughter seems to have a good understanding of the severity of patient's illness. She reports having open and honest conversation about patient's disease progression with her primary attending.  Social History: Patient has resided at LTC memory care for nearly 5 years. She is widowed. She has a son who is not readily available and a daughter who passed away. Her granddaughter is  her primary support system. She previously enjoyed cooking, sewing, and crossword puzzles.   Functional and Nutritional State: Patient's granddaughter reports gradual decline that has progressed faster over the past few months with frequent falls, refusal to participate in OT, difficulty with using a walker and mostly using a wheelchair. Albumin of 3.1 noted. Granddaughter also reports cognitive decline, as patient still believes she is around 82 years old and does not believe any updates regarding new medical diagnoses since then.  Palliative Symptoms: Dyspnea, hand pain  Advance Directives: A detailed discussion regarding advanced directives was had. Patient never completed any HCPOA/living will documentation.  Code Status: Concepts specific to code status, artifical feeding and hydration, and rehospitalization were considered and discussed.  Explored granddaughter's thoughts about DNR recommendations and she states that this makes sense to her.  She does not seem ready to make a final decision yet and I encouraged her to consider ongoing discussions in the outpatient setting.  Discussion: Emotional support and therapeutic listening was provided as granddaughter shared how she misses patient's cooking and the ways patient used to enjoy several hobbies.  Patient has been described as stubborn and not willing to participate in therapies to maintain her strength.  She has never discussed her end-of-life care preferences with [name], other than sharing that she wishes to be buried with her husband when her time comes. [name] has never had conversations about patient's prognosis with her providers before this hospitalization.  She is appreciative of the information received from Dr. Jomarie Longs.  We reviewed patient's disease progression and worsening medical issues including progressive heart failure and dementia. We discussed plans for patient's return to long-term care today and [name] confirms this is  the best solution at this time.  She is not sure of any long-term or short-term goals that she or patient have at this time.  She does want patient to be as comfortable as possible.  She would be agreeable to outpatient palliative care follow-up for continued conversation about patient's care, quality of life, and medical decision making.  Clarified that it is uncertain how frequent outpatient palliative care visits would occur, as there are different community based programs with different availabilities and schedules.   The difference between aggressive medical intervention and comfort care was considered in light of the patient's goals of care. Hospice and Palliative Care services outpatient were explained and offered.   Discussed the importance of continued conversation with family and the medical providers regarding overall plan of care and treatment options, ensuring decisions are within the context of the patient's values and GOCs.   Questions and concerns were addressed.  The family was encouraged to call with questions or concerns.  PMT will continue to support holistically.   SUMMARY OF RECOMMENDATIONS   -Continue full code/full scope treatment for now -Outpatient palliative care referral for ongoing goals of care discussions surrounding CODE STATUS and other care preferences -Patient's granddaughter would like to prioritize her quality of life and comfort.  She has a good understanding of the severity of patient's illness -Psychosocial and emotional support provided -PMT remains available as needed   Prognosis:  Unable to determine  Discharge Planning: Skilled Nursing Facility for rehab with Palliative care service follow-up      Primary Diagnoses: Present on Admission:  Acute on chronic diastolic CHF (congestive heart failure) (HCC)  Dementia (HCC)  Essential hypertension  Paroxysmal atrial fibrillation (HCC)  Hypothyroidism   Physical Exam Vitals and nursing note  reviewed.  Constitutional:      General: She is not in acute distress. Cardiovascular:     Rate and Rhythm: Normal rate.  Pulmonary:     Effort: Pulmonary effort is normal. No respiratory distress.  Neurological:     Mental Status: She is alert.    Vital Signs: BP (!) 123/51 (BP Location: Right Arm)   Pulse 82   Temp (!) 96.6 F (35.9 C) (Axillary)   Resp 17   Ht 5' (1.524 m)   Wt 91.1 kg   SpO2 99%   BMI 39.22 kg/m  Pain Scale: PAINAD   Pain Score: 0-No pain   SpO2: SpO2: 99 % O2 Device:SpO2: 99 % O2 Flow Rate: .    Palliative Assessment/Data: 40%     MDM: high    Rashon Rezek Jeni Salles, PA-C  Palliative Medicine Team Team phone # [phone]  Thank you for allowing the Palliative Medicine Team to assist in the care of this patient. Please utilize secure chat with additional questions, if there is no response within 30 minutes please call the above phone number.  Palliative Medicine Team providers are available by phone from 7am to 7pm daily and can be reached through the team cell phone.  Should this patient require assistance outside of these hours, please call the patient's attending physician.

## 2022-06-29 NOTE — TOC Transition Note (Signed)
Transition of Care [facility]) - CM/SW Discharge Note   Patient Details  Name: Ann Cole MRN: [id number] Date of Birth: 06/05/1940  Transition of Care [facility]) CM/SW Contact:  Leander Rams, LCSW Phone Number: 06/29/2022, 4:26 PM   Clinical Narrative:    Patient will DC to: [facility]  Anticipated DC date: 06/29/2022 Family notified: VM left with [name] Transport by: Sharin Mons   Per MD patient ready for DC to [facility]. RN, patient, patient's family, and facility notified of DC. Discharge Summary and FL2 sent to facility. RN to call report prior to discharge [phone]. DC packet on chart. Ambulance transport requested for patient.   CSW will sign off for now as social work intervention is no longer needed. Please consult [place] again if new needs arise.    Final next level of care: Assisted Living Barriers to Discharge: No Barriers Identified   Patient Goals and CMS Choice      Discharge Placement                Patient chooses bed at: [facility] Patient to be transferred to facility by: [facility] Name of family member notified: VM left with [name] Patient and family notified of of transfer: 06/29/22  Discharge Plan and Services Additional resources added to the After Visit Summary for                                       Social Determinants of Health (SDOH) Interventions SDOH Screenings   Food Insecurity: Patient Unable To Answer (06/25/2022)  Housing: Patient Unable To Answer (06/25/2022)  Transportation Needs: Patient Unable To Answer (06/25/2022)  Utilities: Patient Unable To Answer (06/25/2022)  Tobacco Use: Low Risk  (06/20/2022)     Readmission Risk Interventions     No data to display          Oletta Lamas, MSW, LCSWA, LCASA Transitions of Care  Clinical Social Worker I

## 2022-06-29 NOTE — TOC Progression Note (Addendum)
Transition of Care [facility]) - Progression Note    Patient Details  Name: Rondell Frick MRN: [id number] Date of Birth: 21-Jul-1940  Transition of Care [facility]) CM/SW Contact  Leander Rams, LCSW Phone Number: 06/29/2022, 2:49 PM  Clinical Narrative:    CSW spoke with Porfirio Mylar at [facility]. Miyako states that pt must be evaluated before she can return to ALF. ALF has sent someone out to the hospital today to evaluate pt and inform CSW on if pt can return or will need to dc to SNF.  Idelle expresses they can provide therapy at ALF but need to know what level of care pt will need before she is able to return. CSW faxed over dc summary. CSW awaiting response on if ALF can admit pt back to facility today.   TOC will continue to follow.   3:30PM CSW called [facility] to check on status of pt returning. Pt can return and receive therapy at facility.   Expected Discharge Plan: Skilled Nursing Facility Barriers to Discharge: Continued Medical Work up  Expected Discharge Plan and Services       Living arrangements for the past 2 months: Assisted Living Facility Expected Discharge Date: 06/29/22                                     Social Determinants of Health (SDOH) Interventions SDOH Screenings   Food Insecurity: Patient Unable To Answer (06/25/2022)  Housing: Patient Unable To Answer (06/25/2022)  Transportation Needs: Patient Unable To Answer (06/25/2022)  Utilities: Patient Unable To Answer (06/25/2022)  Tobacco Use: Low Risk  (06/20/2022)    Readmission Risk Interventions     No data to display         Oletta Lamas, MSW, LCSWA, LCASA Transitions of Care  Clinical Social Worker I

## 2022-08-16 ENCOUNTER — Emergency Department (HOSPITAL_COMMUNITY): Payer: Medicare Other

## 2022-08-16 ENCOUNTER — Other Ambulatory Visit: Payer: Self-pay

## 2022-08-16 ENCOUNTER — Inpatient Hospital Stay (HOSPITAL_COMMUNITY)
Admission: EM | Admit: 2022-08-16 | Discharge: 2022-08-25 | DRG: 871 | Disposition: A | Discharge status: Skilled Nursing Facility | Payer: Medicare Other | Attending: Internal Medicine | Admitting: Internal Medicine

## 2022-08-16 DIAGNOSIS — I272 Pulmonary hypertension, unspecified: Secondary | ICD-10-CM | POA: Diagnosis present

## 2022-08-16 DIAGNOSIS — L89892 Pressure ulcer of other site, stage 2: Secondary | ICD-10-CM | POA: Diagnosis not present

## 2022-08-16 DIAGNOSIS — R8281 Pyuria: Secondary | ICD-10-CM | POA: Diagnosis present

## 2022-08-16 DIAGNOSIS — I4892 Unspecified atrial flutter: Secondary | ICD-10-CM | POA: Diagnosis present

## 2022-08-16 DIAGNOSIS — Z7982 Long term (current) use of aspirin: Secondary | ICD-10-CM

## 2022-08-16 DIAGNOSIS — I110 Hypertensive heart disease with heart failure: Secondary | ICD-10-CM | POA: Diagnosis present

## 2022-08-16 DIAGNOSIS — T68XXXA Hypothermia, initial encounter: Secondary | ICD-10-CM

## 2022-08-16 DIAGNOSIS — Z79899 Other long term (current) drug therapy: Secondary | ICD-10-CM | POA: Diagnosis not present

## 2022-08-16 DIAGNOSIS — Z79890 Hormone replacement therapy (postmenopausal): Secondary | ICD-10-CM

## 2022-08-16 DIAGNOSIS — I480 Paroxysmal atrial fibrillation: Secondary | ICD-10-CM | POA: Diagnosis present

## 2022-08-16 DIAGNOSIS — G9341 Metabolic encephalopathy: Secondary | ICD-10-CM | POA: Diagnosis not present

## 2022-08-16 DIAGNOSIS — E039 Hypothyroidism, unspecified: Secondary | ICD-10-CM | POA: Diagnosis present

## 2022-08-16 DIAGNOSIS — R6520 Severe sepsis without septic shock: Secondary | ICD-10-CM | POA: Diagnosis present

## 2022-08-16 DIAGNOSIS — I5032 Chronic diastolic (congestive) heart failure: Secondary | ICD-10-CM | POA: Diagnosis present

## 2022-08-16 DIAGNOSIS — Z8249 Family history of ischemic heart disease and other diseases of the circulatory system: Secondary | ICD-10-CM | POA: Diagnosis not present

## 2022-08-16 DIAGNOSIS — F0390 Unspecified dementia without behavioral disturbance: Secondary | ICD-10-CM | POA: Diagnosis present

## 2022-08-16 DIAGNOSIS — A419 Sepsis, unspecified organism: Secondary | ICD-10-CM | POA: Diagnosis not present

## 2022-08-16 DIAGNOSIS — Z6839 Body mass index (BMI) 39.0-39.9, adult: Secondary | ICD-10-CM

## 2022-08-16 DIAGNOSIS — E785 Hyperlipidemia, unspecified: Secondary | ICD-10-CM | POA: Diagnosis present

## 2022-08-16 DIAGNOSIS — R680 Hypothermia, not associated with low environmental temperature: Secondary | ICD-10-CM | POA: Diagnosis present

## 2022-08-16 DIAGNOSIS — I440 Atrioventricular block, first degree: Secondary | ICD-10-CM | POA: Diagnosis present

## 2022-08-16 DIAGNOSIS — Z603 Acculturation difficulty: Secondary | ICD-10-CM | POA: Diagnosis present

## 2022-08-16 DIAGNOSIS — N3000 Acute cystitis without hematuria: Secondary | ICD-10-CM | POA: Diagnosis present

## 2022-08-16 DIAGNOSIS — B9620 Unspecified Escherichia coli [E. coli] as the cause of diseases classified elsewhere: Secondary | ICD-10-CM | POA: Diagnosis present

## 2022-08-16 LAB — CBC WITH DIFFERENTIAL/PLATELET
Abs Immature Granulocytes: 0.03 10*3/uL (ref 0.00–0.07)
Basophils Absolute: 0.1 10*3/uL (ref 0.0–0.1)
Basophils Relative: 1 %
Eosinophils Absolute: 0.1 10*3/uL (ref 0.0–0.5)
Eosinophils Relative: 1 %
HCT: 47.7 % — ABNORMAL HIGH (ref 36.0–46.0)
Hemoglobin: 14.5 g/dL (ref 12.0–15.0)
Immature Granulocytes: 0 %
Lymphocytes Relative: 22 %
Lymphs Abs: 1.8 10*3/uL (ref 0.7–4.0)
MCH: 29.3 pg (ref 26.0–34.0)
MCHC: 30.4 g/dL (ref 30.0–36.0)
MCV: 96.4 fL (ref 80.0–100.0)
Monocytes Absolute: 0.8 10*3/uL (ref 0.1–1.0)
Monocytes Relative: 9 %
Neutro Abs: 5.6 10*3/uL (ref 1.7–7.7)
Neutrophils Relative %: 67 %
Platelets: 240 10*3/uL (ref 150–400)
RBC: 4.95 MIL/uL (ref 3.87–5.11)
RDW: 17 % — ABNORMAL HIGH (ref 11.5–15.5)
WBC: 8.3 10*3/uL (ref 4.0–10.5)
nRBC: 0 % (ref 0.0–0.2)

## 2022-08-16 LAB — URINALYSIS, ROUTINE W REFLEX MICROSCOPIC
Bilirubin Urine: NEGATIVE
Glucose, UA: NEGATIVE mg/dL
Hgb urine dipstick: NEGATIVE
Ketones, ur: NEGATIVE mg/dL
Nitrite: POSITIVE — AB
Protein, ur: NEGATIVE mg/dL
Specific Gravity, Urine: 1.006 (ref 1.005–1.030)
WBC, UA: >50 WBC/hpf (ref 0–5)
pH: 5 (ref 5.0–8.0)

## 2022-08-16 LAB — COMPREHENSIVE METABOLIC PANEL
ALT: 27 U/L (ref 0–44)
AST: 32 U/L (ref 15–41)
Albumin: 3.3 g/dL — ABNORMAL LOW (ref 3.5–5.0)
Alkaline Phosphatase: 142 U/L — ABNORMAL HIGH (ref 38–126)
Anion gap: 13 (ref 5–15)
BUN: 21 mg/dL (ref 8–23)
CO2: 23 mmol/L (ref 22–32)
Calcium: 9.7 mg/dL (ref 8.9–10.3)
Chloride: 102 mmol/L (ref 98–111)
Creatinine, Ser: 1.07 mg/dL — ABNORMAL HIGH (ref 0.44–1.00)
GFR, Estimated: 51 mL/min — ABNORMAL LOW (ref >60)
Glucose, Bld: 84 mg/dL (ref 70–99)
Potassium: 4.7 mmol/L (ref 3.5–5.1)
Sodium: 138 mmol/L (ref 135–145)
Total Bilirubin: 0.3 mg/dL (ref 0.3–1.2)
Total Protein: 7.5 g/dL (ref 6.5–8.1)

## 2022-08-16 LAB — TSH: TSH: 9.896 u[IU]/mL — ABNORMAL HIGH (ref 0.350–4.500)

## 2022-08-16 LAB — LACTIC ACID, PLASMA: Lactic Acid, Venous: 2.1 mmol/L (ref 0.5–1.9)

## 2022-08-16 LAB — CBG MONITORING, ED: Glucose-Capillary: 73 mg/dL (ref 70–99)

## 2022-08-16 MED ORDER — ONDANSETRON HCL 4 MG/2ML IJ SOLN: 4.0000 mg | Freq: Four times a day (QID) | INTRAMUSCULAR | Status: DC | PRN

## 2022-08-16 MED ORDER — ACETAMINOPHEN 650 MG RE SUPP: 650.0000 mg | Freq: Four times a day (QID) | RECTAL | Status: DC | PRN

## 2022-08-16 MED ORDER — ACETAMINOPHEN 325 MG PO TABS
650.0000 mg | ORAL_TABLET | Freq: Four times a day (QID) | ORAL | Status: DC | PRN
  Administered 2022-08-20 – 2022-08-25 (×2): 650 mg via ORAL
  Filled 2022-08-16 (×2): qty 2

## 2022-08-16 MED ORDER — SODIUM CHLORIDE 0.9 % IV SOLN
1.0000 g | INTRAVENOUS | Status: DC
  Administered 2022-08-17 – 2022-08-18 (×2): 1 g via INTRAVENOUS
  Filled 2022-08-16 (×2): qty 10

## 2022-08-16 MED ORDER — MELATONIN 3 MG PO TABS
3.0000 mg | ORAL_TABLET | Freq: Every evening | ORAL | Status: DC | PRN
  Administered 2022-08-18 – 2022-08-21 (×2): 3 mg via ORAL
  Filled 2022-08-16 (×2): qty 1

## 2022-08-16 MED ORDER — SODIUM CHLORIDE 0.9 % IV SOLN
1.0000 g | Freq: Once | INTRAVENOUS | Status: AC
  Administered 2022-08-16: 1 g via INTRAVENOUS
  Filled 2022-08-16: qty 10

## 2022-08-16 NOTE — ED Provider Notes (Signed)
[facility] EMERGENCY DEPARTMENT AT [facility] Provider Note   CSN: [id number] Arrival date & time: 08/16/22  1735     History  Chief Complaint  Patient presents with   Drowsy     Ann Cole is a 82 y.o. female.  Pt is a 82 yo female with pmhx significant for hld, hypothyroidism, htn, advanced dementia, afib (not on thinners), and chf.  Pt presents to the ED today with drowsiness.  Pt is unable to give any hx.    We attempted hx via Spanish video interpreter, but pt was unable to communicate with the interpreter.       Home Medications Prior to Admission medications   Medication Sig Start Date End Date Taking? Authorizing Provider  acetaminophen (TYLENOL) 500 MG tablet Take 500 mg by mouth in the morning, at noon, and at bedtime.    [provider]  aspirin EC 325 MG EC tablet Take 1 tablet (325 mg total) by mouth daily. Patient not taking: Reported on 04/27/2021 08/25/18   Osvaldo Shipper, MD  Cholecalciferol 50 MCG (2000 UT) CAPS Take 2,000 Units by mouth daily.    [provider]  ferrous sulfate 325 (65 FE) MG tablet Take 1 tablet (325 mg total) by mouth daily with breakfast. 04/29/21 06/21/22  Lynwood Dawley S, DO  furosemide (LASIX) 40 MG tablet Take 1 tablet (40 mg total) by mouth daily. 06/30/22   Zannie Cove, MD  levothyroxine (SYNTHROID) 112 MCG tablet Take 112 mcg by mouth daily. 05/02/22   [provider]  magnesium hydroxide (MILK OF MAGNESIA) 400 MG/5ML suspension Take 30 mLs by mouth at bedtime as needed (constipation). Patient not taking: Reported on 06/21/2022    [provider]  Melatonin 5 MG TABS Take 5 mg by mouth at bedtime.    [provider]  memantine (NAMENDA) 5 MG tablet Take 1 tablet (5 mg total) by mouth 2 (two) times daily. 11/29/17   Deeann Saint, MD  metoprolol succinate (TOPROL-XL) 50 MG 24 hr tablet Take 1 tablet (50 mg total) by mouth daily. Take with or immediately following a  meal. 06/29/22 07/29/22  Zannie Cove, MD  sertraline (ZOLOFT) 50 MG tablet Take 50 mg by mouth every morning.    [provider]  spironolactone (ALDACTONE) 25 MG tablet Take 1 tablet (25 mg total) by mouth daily. 06/30/22   Zannie Cove, MD  vitamin C (ASCORBIC ACID) 250 MG tablet Take 250 mg by mouth daily.    [provider]      Allergies    Patient has no known allergies.    Review of Systems   Review of Systems  Unable to perform ROS: Dementia  All other systems reviewed and are negative.   Physical Exam Updated Vital Signs BP (!) 138/98 (BP Location: Left Arm)   Pulse (!) 54   Temp (!) 91.8 F (33.2 C) (Rectal)   Resp 14   SpO2 100%  Physical Exam Vitals and nursing note reviewed.  Constitutional:      Appearance: Normal appearance. She is obese.  HENT:     Head: Normocephalic and atraumatic.     Right Ear: External ear normal.     Left Ear: External ear normal.     Nose: Nose normal.     Mouth/Throat:     Mouth: Mucous membranes are dry.  Eyes:     Extraocular Movements: Extraocular movements intact.     Conjunctiva/sclera: Conjunctivae normal.     Pupils:  Pupils are equal, round, and reactive to light.  Cardiovascular:     Rate and Rhythm: Normal rate and regular rhythm.     Pulses: Normal pulses.     Heart sounds: Normal heart sounds.  Pulmonary:     Effort: Pulmonary effort is normal.     Breath sounds: Normal breath sounds.  Abdominal:     General: Abdomen is flat. Bowel sounds are normal.     Palpations: Abdomen is soft.  Musculoskeletal:     Cervical back: Normal range of motion and neck supple.     Comments: Left wrist deformity (old per epic review)  Skin:    General: Skin is warm.     Capillary Refill: Capillary refill takes less than 2 seconds.  Neurological:     Mental Status: She is alert.     Comments: Pt able to tell me her name.  She is moving all 4 extremities.  Grip strength on left weaker than on right (from fx vs  cva?)     ED Results / Procedures / Treatments   Labs (all labs ordered are listed, but only abnormal results are displayed) Labs Reviewed  CBC WITH DIFFERENTIAL/PLATELET - Abnormal; Notable for the following components:      Result Value   HCT 47.7 (*)    RDW 17.0 (*)    All other components within normal limits  COMPREHENSIVE METABOLIC PANEL - Abnormal; Notable for the following components:   Creatinine, Ser 1.07 (*)    Albumin 3.3 (*)    Alkaline Phosphatase 142 (*)    GFR, Estimated 51 (*)    All other components within normal limits  URINALYSIS, ROUTINE W REFLEX MICROSCOPIC - Abnormal; Notable for the following components:   APPearance HAZY (*)    Nitrite POSITIVE (*)    Leukocytes,Ua LARGE (*)    Bacteria, UA MANY (*)    All other components within normal limits  LACTIC ACID, PLASMA - Abnormal; Notable for the following components:   Lactic Acid, Venous 2.1 (*)    All other components within normal limits  TSH - Abnormal; Notable for the following components:   TSH 9.896 (*)    All other components within normal limits  URINE CULTURE  CBG MONITORING, ED    EKG EKG Interpretation Date/Time:  Tuesday August 16 2022 18:05:00 EDT Ventricular Rate:  55 PR Interval:    QRS Duration:  101 QT Interval:  458 QTC Calculation: 439 R Axis:   -42  Text Interpretation: Atrial flutter with predominant 4:1 AV block Left anterior fascicular block Repol abnrm suggests ischemia, diffuse leads No significant change since last tracing Confirmed by Jacalyn Lefevre [phone]) on 08/16/2022 6:18:36 PM  Radiology MR BRAIN WO CONTRAST  Result Date: 08/16/2022 CLINICAL DATA:  Initial evaluation for neuro deficit, stroke. EXAM: MRI HEAD WITHOUT CONTRAST TECHNIQUE: Multiplanar, multiecho pulse sequences of the brain and surrounding structures were obtained without intravenous contrast. COMPARISON:  CT from earlier the same day. FINDINGS: Brain: Moderately advanced cerebral atrophy, most  pronounced about the frontal and temporal lobes bilaterally. Underlying mild-to-moderate chronic microvascular ischemic disease. No evidence for acute or subacute ischemia. Gray-white matter differentiation maintained. No areas of chronic cortical infarction. No acute or chronic intracranial blood products. No mass lesion, midline shift or mass effect. No hydrocephalus or extra-axial fluid collection. Empty sella noted. Vascular: Major intracranial vascular flow voids are maintained. Skull and upper cervical spine: Craniocervical junction within normal limits. Bone marrow signal intensity normal. No scalp soft tissue abnormality. Sinuses/Orbits:  Globes orbital soft tissues within normal limits. Paranasal sinuses are largely clear. No significant mastoid effusion. Other: None. IMPRESSION: 1. No acute intracranial abnormality. 2. Moderate frontotemporal predominant cerebral atrophy. 3. Underlying mild-to-moderate chronic microvascular ischemic disease. 4. Empty sella. Electronically Signed   By: Rise Mu M.D.   On: 08/16/2022 23:10   CT Head Wo Contrast  Result Date: 08/16/2022 CLINICAL DATA:  Neuro deficit EXAM: CT HEAD WITHOUT CONTRAST TECHNIQUE: Contiguous axial images were obtained from the base of the skull through the vertex without intravenous contrast. RADIATION DOSE REDUCTION: This exam was performed according to the departmental dose-optimization program which includes automated exposure control, adjustment of the mA and/or kV according to patient size and/or use of iterative reconstruction technique. COMPARISON:  CT brain 06/07/2022 FINDINGS: Brain: No acute territorial infarction, hemorrhage or intracranial mass. Moderate atrophy. Moderate chronic small vessel ischemic changes of the white matter. Stable ventricle size. Vascular: No hyperdense vessels.  Carotid vascular calcification Skull: Normal. Negative for fracture or focal lesion. Sinuses/Orbits: No acute finding. Other: None  IMPRESSION: 1. No CT evidence for acute intracranial abnormality. 2. Atrophy and chronic small vessel ischemic changes of the white matter. Electronically Signed   By: Jasmine Pang M.D.   On: 08/16/2022 20:01   DG Chest Portable 1 View  Result Date: 08/16/2022 CLINICAL DATA:  Altered mental status EXAM: PORTABLE CHEST 1 VIEW COMPARISON:  06/27/2022 x-ray.  CTA 06/22/2022 FINDINGS: Stable enlarged cardiopericardial silhouette. Calcified tortuous aorta. No consolidation, pneumothorax or effusion. No edema. Mild interstitial prominence. Overlapping cardiac leads. Chronic deformity of the right shoulder. Degenerative changes along the spine. IMPRESSION: Enlarged heart.  No consolidation or effusion. Electronically Signed   By: Karen Kays M.D.   On: 08/16/2022 18:37    Procedures Procedures    Medications Ordered in ED Medications  cefTRIAXone (ROCEPHIN) 1 g in sodium chloride 0.9 % 100 mL IVPB (0 g Intravenous Stopped 08/16/22 2016)    ED Course/ Medical Decision Making/ A&P                                 Medical Decision Making Amount and/or Complexity of Data Reviewed Labs: ordered. Radiology: ordered.  Risk Decision regarding hospitalization.   This patient presents to the ED for concern of ams, this involves an extensive number of treatment options, and is a complaint that carries with it a high risk of complications and morbidity.  The differential diagnosis includes infection, electrolyte abn, anemia, cva   Co morbidities that complicate the patient evaluation  hld, hypothyroidism, htn, advanced dementia, afib (not on thinners), and chf.   Additional history obtained:  Additional history obtained from epic chart review External records from outside source obtained and reviewed including EMS report   Lab Tests:  I Ordered, and personally interpreted labs.  The pertinent results include:  cbc nl, cmp nl, TSH 9.896, UA + for nitrite, leuk, WBC >50   Imaging Studies  ordered:  I ordered imaging studies including cxr, ct head  I independently visualized and interpreted imaging which showed  CXR: Enlarged heart.  No consolidation or effusion.  CT head: No CT evidence for acute intracranial abnormality.  2. Atrophy and chronic small vessel ischemic changes of the white  matter.  MRI brain:  No acute intracranial abnormality.  2. Moderate frontotemporal predominant cerebral atrophy.  3. Underlying mild-to-moderate chronic microvascular ischemic  disease.  4. Empty sella.   I agree with the radiologist  interpretation   Cardiac Monitoring:  The patient was maintained on a cardiac monitor.  I personally viewed and interpreted the cardiac monitored which showed an underlying rhythm of: a flutter   Medicines ordered and prescription drug management:  I ordered medication including rocephin for uti  Reevaluation of the patient after these medicines showed that the patient improved I have reviewed the patients home medicines and have made adjustments as needed   Test Considered:  mri   Critical Interventions:  abx     Problem List / ED Course:  UTI with encephalopathy and hypothermia:  rocephin given.  IVFs held as she has some fluid overload. Pt d/w Dr. Arlean Hopping ([facility]) for admission   Reevaluation:  After the interventions noted above, I reevaluated the patient and found that they have :improved   Social Determinants of Health:  Lives in snf   Dispostion:  After consideration of the diagnostic results and the patients response to treatment, I feel that the patent would benefit from admission.          Final Clinical Impression(s) / ED Diagnoses Final diagnoses:  Acute cystitis without hematuria  Acute metabolic encephalopathy  Hypothermia, initial encounter    Rx / DC Orders ED Discharge Orders     None         Jacalyn Lefevre, MD 08/16/22 2335

## 2022-08-16 NOTE — ED Notes (Signed)
MD notified of pt temperature. Bair Hugger applied

## 2022-08-16 NOTE — ED Triage Notes (Signed)
Patient BIB EMS from memory care unit with c/o drowsiness, 116/60, A-fitter, CBG 100, 97%ra, 20grhand, left wrist with deformity from past fall. Denies any pain

## 2022-08-16 NOTE — H&P (Signed)
History and Physical      Ann Cole XNA:[id number] DOB: 1940/07/30 DOA: 08/16/2022; DOS: 08/16/2022  PCP: Deeann Saint, MD *** Patient coming from: home ***  I have personally briefly reviewed patient's old medical records in [facility]  Chief Complaint: ***  HPI: Ann Cole is a 82 y.o. female with medical history significant for *** who is admitted to [facility] on 08/16/2022 with *** after presenting from home*** to [facility] ED complaining of ***.    ***       ***   ED Course:  Vital signs in the ED were notable for the following: ***  Labs were notable for the following: ***  Per my interpretation, EKG in ED demonstrated the following:  ***  Imaging in the ED, per corresponding formal radiology read, was notable for the following:  ***  While in the ED, the following were administered: ***  Subsequently, the patient was admitted  ***  ***red    Review of Systems: As per HPI otherwise 10 point review of systems negative.   Past Medical History:  Diagnosis Date   Chronic diastolic heart failure (HCC)    Dementia (HCC)    Hyperlipidemia    Hypertension    Paroxysmal atrial fibrillation (HCC)    Thyroid disease     No past surgical history on file.  Social History:  reports that she has never smoked. She has never used smokeless tobacco. She reports that she does not drink alcohol and does not use drugs.   No Known Allergies  Family History  Problem Relation Age of Onset   Heart disease Mother    Mental illness Mother    Stroke Father    Mental illness Brother    Heart disease Daughter    Alcohol abuse Son     Family history reviewed and not pertinent ***   Prior to Admission medications   Medication Sig Start Date End Date Taking? Authorizing Provider  acetaminophen (TYLENOL) 500 MG tablet Take 500 mg by mouth in the morning, at noon, and at bedtime.    [provider]  aspirin EC 325 MG EC  tablet Take 1 tablet (325 mg total) by mouth daily. Patient not taking: Reported on 04/27/2021 08/25/18   Osvaldo Shipper, MD  Cholecalciferol 50 MCG (2000 UT) CAPS Take 2,000 Units by mouth daily.    [provider]  ferrous sulfate 325 (65 FE) MG tablet Take 1 tablet (325 mg total) by mouth daily with breakfast. 04/29/21 06/21/22  Lynwood Dawley S, DO  furosemide (LASIX) 40 MG tablet Take 1 tablet (40 mg total) by mouth daily. 06/30/22   Zannie Cove, MD  levothyroxine (SYNTHROID) 112 MCG tablet Take 112 mcg by mouth daily. 05/02/22   [provider]  magnesium hydroxide (MILK OF MAGNESIA) 400 MG/5ML suspension Take 30 mLs by mouth at bedtime as needed (constipation). Patient not taking: Reported on 06/21/2022    [provider]  Melatonin 5 MG TABS Take 5 mg by mouth at bedtime.    [provider]  memantine (NAMENDA) 5 MG tablet Take 1 tablet (5 mg total) by mouth 2 (two) times daily. 11/29/17   Deeann Saint, MD  metoprolol succinate (TOPROL-XL) 50 MG 24 hr tablet Take 1 tablet (50 mg total) by mouth daily. Take with or immediately following a meal. 06/29/22 07/29/22  Zannie Cove, MD  sertraline (ZOLOFT) 50 MG tablet Take 50 mg by mouth every morning.    [provider]  spironolactone (ALDACTONE) 25 MG tablet Take 1 tablet (25 mg total) by mouth daily. 06/30/22   Zannie Cove, MD  vitamin C (ASCORBIC ACID) 250 MG tablet Take 250 mg by mouth daily.    [provider]     Objective    Physical Exam: Vitals:   08/16/22 1833 08/16/22 1915 08/16/22 2016 08/16/22 2042  BP:  131/74 (!) 138/98   Pulse:  (!) 53 (!) 54   Resp:  14 14   Temp: 97.8 F (36.6 C)   (!) 91.8 F (33.2 C)  TempSrc: Oral   Rectal  SpO2:  100% 100%     General: appears to be stated age; alert, oriented Skin: warm, dry, no rash Head:  AT/[place] Mouth:  Oral mucosa membranes appear moist, normal dentition Neck: supple; trachea midline Heart:  RRR; did not  appreciate any M/R/G Lungs: CTAB, did not appreciate any wheezes, rales, or rhonchi Abdomen: + BS; soft, ND, NT Vascular: 2+ pedal pulses b/l; 2+ radial pulses b/l Extremities: no peripheral edema, no muscle wasting Neuro: strength and sensation intact in upper and lower extremities b/l ***   *** Neuro: 5/5 strength of the proximal and distal flexors and extensors of the upper and lower extremities bilaterally; sensation intact in upper and lower extremities b/l; cranial nerves II through XII grossly intact; no pronator drift; no evidence suggestive of slurred speech, dysarthria, or facial droop; Normal muscle tone. No tremors.  *** Neuro: In the setting of the patient's current mental status and associated inability to follow instructions, unable to perform full neurologic exam at this time.  As such, assessment of strength, sensation, and cranial nerves is limited at this time. Patient noted to spontaneously move all 4 extremities. No tremors.  ***    Labs on Admission: I have personally reviewed following labs and imaging studies  CBC: Recent Labs  Lab 08/16/22 1828  WBC 8.3  NEUTROABS 5.6  HGB 14.5  HCT 47.7*  MCV 96.4  PLT 240   Basic Metabolic Panel: Recent Labs  Lab 08/16/22 1828  NA 138  K 4.7  CL 102  CO2 23  GLUCOSE 84  BUN 21  CREATININE 1.07*  CALCIUM 9.7   GFR: CrCl cannot be calculated (Unknown ideal weight.). Liver Function Tests: Recent Labs  Lab 08/16/22 1828  AST 32  ALT 27  ALKPHOS 142*  BILITOT 0.3  PROT 7.5  ALBUMIN 3.3*   No results for input(s): "LIPASE", "AMYLASE" in the last 168 hours. No results for input(s): "AMMONIA" in the last 168 hours. Coagulation Profile: No results for input(s): "INR", "PROTIME" in the last 168 hours. Cardiac Enzymes: No results for input(s): "CKTOTAL", "CKMB", "CKMBINDEX", "TROPONINI" in the last 168 hours. BNP (last 3 results) No results for input(s): "PROBNP" in the last 8760 hours. HbA1C: No  results for input(s): "HGBA1C" in the last 72 hours. CBG: Recent Labs  Lab 08/16/22 1843  GLUCAP 73   Lipid Profile: No results for input(s): "CHOL", "HDL", "LDLCALC", "TRIG", "CHOLHDL", "LDLDIRECT" in the last 72 hours. Thyroid Function Tests: Recent Labs    08/16/22 1828  TSH 9.896*   Anemia Panel: No results for input(s): "VITAMINB12", "FOLATE", "FERRITIN", "TIBC", "IRON", "RETICCTPCT" in the last 72 hours. Urine analysis:    Component Value Date/Time   COLORURINE YELLOW 08/16/2022 1825   APPEARANCEUR HAZY (A) 08/16/2022 1825   LABSPEC 1.006 08/16/2022 1825   PHURINE 5.0 08/16/2022 1825   GLUCOSEU NEGATIVE 08/16/2022 1825   HGBUR NEGATIVE 08/16/2022 1825  BILIRUBINUR NEGATIVE 08/16/2022 1825   KETONESUR NEGATIVE 08/16/2022 1825   PROTEINUR NEGATIVE 08/16/2022 1825   NITRITE POSITIVE (A) 08/16/2022 1825   LEUKOCYTESUR LARGE (A) 08/16/2022 1825    Radiological Exams on Admission: MR BRAIN WO CONTRAST  Result Date: 08/16/2022 CLINICAL DATA:  Initial evaluation for neuro deficit, stroke. EXAM: MRI HEAD WITHOUT CONTRAST TECHNIQUE: Multiplanar, multiecho pulse sequences of the brain and surrounding structures were obtained without intravenous contrast. COMPARISON:  CT from earlier the same day. FINDINGS: Brain: Moderately advanced cerebral atrophy, most pronounced about the frontal and temporal lobes bilaterally. Underlying mild-to-moderate chronic microvascular ischemic disease. No evidence for acute or subacute ischemia. Gray-white matter differentiation maintained. No areas of chronic cortical infarction. No acute or chronic intracranial blood products. No mass lesion, midline shift or mass effect. No hydrocephalus or extra-axial fluid collection. Empty sella noted. Vascular: Major intracranial vascular flow voids are maintained. Skull and upper cervical spine: Craniocervical junction within normal limits. Bone marrow signal intensity normal. No scalp soft tissue abnormality.  Sinuses/Orbits: Globes orbital soft tissues within normal limits. Paranasal sinuses are largely clear. No significant mastoid effusion. Other: None. IMPRESSION: 1. No acute intracranial abnormality. 2. Moderate frontotemporal predominant cerebral atrophy. 3. Underlying mild-to-moderate chronic microvascular ischemic disease. 4. Empty sella. Electronically Signed   By: Rise Mu M.D.   On: 08/16/2022 23:10   CT Head Wo Contrast  Result Date: 08/16/2022 CLINICAL DATA:  Neuro deficit EXAM: CT HEAD WITHOUT CONTRAST TECHNIQUE: Contiguous axial images were obtained from the base of the skull through the vertex without intravenous contrast. RADIATION DOSE REDUCTION: This exam was performed according to the departmental dose-optimization program which includes automated exposure control, adjustment of the mA and/or kV according to patient size and/or use of iterative reconstruction technique. COMPARISON:  CT brain 06/07/2022 FINDINGS: Brain: No acute territorial infarction, hemorrhage or intracranial mass. Moderate atrophy. Moderate chronic small vessel ischemic changes of the white matter. Stable ventricle size. Vascular: No hyperdense vessels.  Carotid vascular calcification Skull: Normal. Negative for fracture or focal lesion. Sinuses/Orbits: No acute finding. Other: None IMPRESSION: 1. No CT evidence for acute intracranial abnormality. 2. Atrophy and chronic small vessel ischemic changes of the white matter. Electronically Signed   By: Jasmine Pang M.D.   On: 08/16/2022 20:01   DG Chest Portable 1 View  Result Date: 08/16/2022 CLINICAL DATA:  Altered mental status EXAM: PORTABLE CHEST 1 VIEW COMPARISON:  06/27/2022 x-ray.  CTA 06/22/2022 FINDINGS: Stable enlarged cardiopericardial silhouette. Calcified tortuous aorta. No consolidation, pneumothorax or effusion. No edema. Mild interstitial prominence. Overlapping cardiac leads. Chronic deformity of the right shoulder. Degenerative changes along the  spine. IMPRESSION: Enlarged heart.  No consolidation or effusion. Electronically Signed   By: Karen Kays M.D.   On: 08/16/2022 18:37      Assessment/Plan   Principal Problem:   Acute metabolic encephalopathy   ***            ***                ***                 ***               ***               ***               ***                ***               ***               ***               ***               ***              ***          ***  DVT prophylaxis: SCD's ***  Code Status: Full code*** Family Communication: none*** Disposition Plan: Per Rounding Team Consults called: none***;  Admission status: ***     I SPENT GREATER THAN 75 *** MINUTES IN CLINICAL CARE TIME/MEDICAL DECISION-MAKING IN COMPLETING THIS ADMISSION.      Chaney Born Burnie Hank DO [facility]  From 7PM - 7AM   08/16/2022, 11:37 PM   ***

## 2022-08-17 ENCOUNTER — Encounter (HOSPITAL_COMMUNITY): Payer: Self-pay | Admitting: Internal Medicine

## 2022-08-17 DIAGNOSIS — T68XXXA Hypothermia, initial encounter: Secondary | ICD-10-CM

## 2022-08-17 DIAGNOSIS — G9341 Metabolic encephalopathy: Secondary | ICD-10-CM | POA: Diagnosis not present

## 2022-08-17 DIAGNOSIS — R6520 Severe sepsis without septic shock: Secondary | ICD-10-CM | POA: Diagnosis present

## 2022-08-17 DIAGNOSIS — A419 Sepsis, unspecified organism: Secondary | ICD-10-CM | POA: Diagnosis present

## 2022-08-17 LAB — VITAMIN B12: Vitamin B-12: 827 pg/mL (ref 180–914)

## 2022-08-17 LAB — LACTIC ACID, PLASMA: Lactic Acid, Venous: 1.1 mmol/L (ref 0.5–1.9)

## 2022-08-17 MED ORDER — LEVOTHYROXINE SODIUM 112 MCG PO TABS
112.0000 ug | ORAL_TABLET | Freq: Every day | ORAL | Status: DC
  Administered 2022-08-17 – 2022-08-25 (×9): 112 ug via ORAL
  Filled 2022-08-17 (×9): qty 1

## 2022-08-17 MED ORDER — SODIUM CHLORIDE 0.9 % IV SOLN
3.0000 g | Freq: Three times a day (TID) | INTRAVENOUS | Status: DC
  Administered 2022-08-17 – 2022-08-19 (×6): 3 g via INTRAVENOUS
  Filled 2022-08-17 (×6): qty 8

## 2022-08-17 NOTE — ED Notes (Signed)
Pt was placed on a bedpan and wasn't able to void at this time.

## 2022-08-17 NOTE — Progress Notes (Signed)
Tried to call [facility] to get admission information. No answer. Voicemail left for callback.

## 2022-08-17 NOTE — Hospital Course (Addendum)
51 yof w/ dementia, recent left wrist fracture, chronic heart failure with preserved EF, paroxysmal atrial fibrillation not anticoagulated due to concern for increased fall risk, hypothyroidism, dementia who presented from facility for evaluation of confusion. At baseline patient gets out of bed and ambulates, but has not been out of bed over the course of the last day Patient has been confused relative to her baseline mental status with dementia and more somnolent over the last day. In ED:VS:Rectal temperature noted to be 91.8, subsequently increasing to 98.1 following initiation of Bair hugger; heart rates in the 50s to 70s; systolic blood pressures in the low 100s to 130s; respiratory rate 15-27, oxygen saturation 96 to 100% on room air.Labs>creatinine 1.07.wbc 8300.UA:wbc>50, leukocytes large nitrate positive,Lactic acid 2.1> subsequently improved 1.1 EKG- atrial flutter with predominant 4 1 AV block and no evidence of T wave or ST changes CT head showed no evidence of acute intracranial process\ MRI brain showed no evidence of acute intracranial process CXR showed no evidence of acute cardiopulmonary process Patient admitted for further management

## 2022-08-17 NOTE — Plan of Care (Signed)

## 2022-08-17 NOTE — Progress Notes (Signed)
PROGRESS NOTE Ann Cole  NWG:[id number] DOB: 06/07/1940 DOA: 08/16/2022 PCP: Deeann Saint, MD  Brief Narrative/Hospital Course: 89 yof w/ dementia, recent left wrist fracture, chronic heart failure with preserved EF, paroxysmal atrial fibrillation not anticoagulated due to concern for increased fall risk, hypothyroidism, dementia who presented from facility for evaluation of confusion. At baseline patient gets out of bed and ambulates, but has not been out of bed over the course of the last day Patient has been confused relative to her baseline mental status with dementia and more somnolent over the last day. In ED:VS:Rectal temperature noted to be 91.8, subsequently increasing to 98.1 following initiation of Bair hugger; heart rates in the 50s to 70s; systolic blood pressures in the low 100s to 130s; respiratory rate 15-27, oxygen saturation 96 to 100% on room air.Labs>creatinine 1.07.wbc 8300.UA:wbc>50, leukocytes large nitrate positive,Lactic acid 2.1> subsequently improved 1.1 EKG- atrial flutter with predominant 4 1 AV block and no evidence of T wave or ST changes CT head showed no evidence of acute intracranial process\ MRI brain showed no evidence of acute intracranial process CXR showed no evidence of acute cardiopulmonary process Patient admitted for further management        Subjective: Seen and examined in the ED.   She was sleeping but did wake up answer some questions denies pain  Assessment and Plan: Principal Problem:   Acute metabolic encephalopathy Active Problems:   Chronic diastolic heart failure (HCC)   Paroxysmal atrial fibrillation (HCC)   Acquired hypothyroidism   Acute cystitis   Severe sepsis (HCC)   Hypothermia    Acute metabolic encephalopathy in the setting of UTI Dementia: CT head MRI brain no acute finding.  She is able to wake up and answer some questions this morning. continue underlying UTI treatment, supportive care fall precaution PT  OT as tolerated.  Minimize psychotropic medication  Severe sepsis due to UTI UTI: Initial lactic acidosis resolved, hemodynamically stable, admitted on ceftriaxone. Urine culture-not collected in the ED. last urine culture showed Enterococcus faecalis in June-add unasyn until urine culture is done-nursing reports no sample to send for urine culture  Recent Labs  Lab 08/16/22 1757 08/16/22 1828 08/17/22 0336 08/17/22 0611  WBC  --  8.3 7.2  --   LATICACIDVEN 2.1*  --   --  1.1    Chronic CHF with preserved EF 65% back in June Severe pulmonary hypertension history: Respiratory status stable.  Resume home diuretic regimen once med rec completed  PAF: Beta-blocker on hold, will resume slowly .  Not on anticoagulation due to fall risk  Hypothyroidism continue Synthroid.  DVT prophylaxis: SCDs Start: 08/16/22 2336 Code Status:   Code Status: Full Code Family Communication: plan of care discussed with patient at bedside. Patient status is: Inpatient because of altered mental status and sepsis Level of care: Progressive   Dispo: The patient is from: Nursing facility            Anticipated disposition: Nursing facility in 1 to 2 days Objective: Vitals last 24 hrs: Vitals:   08/17/22 1030 08/17/22 1045 08/17/22 1100 08/17/22 1115  BP: 121/61 (!) 102/51 133/64 (!) 126/59  Pulse: 77 62 61 (!) 47  Resp: 16 14 15 17   Temp:      TempSrc:      SpO2: 98% 96% 97% 99%   Weight change:   Physical Examination: General exam: Was sleeping but did wake up answer some questions denies pain  HEENT:Oral mucosa moist, Ear/Nose WNL grossly Respiratory system:  Bilaterally clear BS,no use of accessory muscle Cardiovascular system: S1 & S2 +, No JVD. Gastrointestinal system: Abdomen soft,NT,ND, BS+ Nervous System: moving all extremities,and following commands. Extremities: LE edema neg,distal peripheral pulses palpable and warm.  Skin: No rashes,no icterus. MSK: Normal muscle bulk,tone, power     Medications reviewed:  Scheduled Meds:  levothyroxine  112 mcg Oral Q0600   Continuous Infusions:  cefTRIAXone (ROCEPHIN)  IV        Diet Order             Diet regular Room service appropriate? Yes; Fluid consistency: Thin  Diet effective now                            Intake/Output Summary (Last 24 hours) at 08/17/2022 1136 Last data filed at 08/16/2022 2016 Gross per 24 hour  Intake 100 ml  Output --  Net 100 ml   Net IO Since Admission: 100 mL [08/17/22 1136]  Wt Readings from Last 3 Encounters:  06/29/22 91.1 kg  06/07/22 104.3 kg  04/28/21 97.2 kg     Unresulted Labs (From admission, onward)     Start     Ordered   08/17/22 0732  Urine Culture (for pregnant, neutropenic or urologic patients or patients with an indwelling urinary catheter)  (Urine Labs)  Add-on,   AD       Question:  Indication  Answer:  Dysuria   08/17/22 0731   08/17/22 0545  Vitamin B12  Once,   R        08/17/22 0545   08/17/22 0531  Culture, OB Urine  Add-on,   AD        08/17/22 0531   08/16/22 1928  Urine Culture  Once,   URGENT       Question:  Indication  Answer:  Altered mental status (if no other cause identified)   08/16/22 1928          Data Reviewed: I have personally reviewed following labs and imaging studies CBC: Recent Labs  Lab 08/16/22 1828 08/17/22 0336  WBC 8.3 7.2  NEUTROABS 5.6 4.9  HGB 14.5 13.6  HCT 47.7* 43.5  MCV 96.4 91.4  PLT 240 233   Basic Metabolic Panel: Recent Labs  Lab 08/16/22 1828 08/17/22 0336  NA 138 138  K 4.7 4.5  CL 102 101  CO2 23 25  GLUCOSE 84 59*  BUN 21 20  CREATININE 1.07* 1.05*  CALCIUM 9.7 9.1  MG  --  1.8   GFR: CrCl cannot be calculated (Unknown ideal weight.). Liver Function Tests: Recent Labs  Lab 08/16/22 1828 08/17/22 0336  AST 32 27  ALT 27 23  ALKPHOS 142* 130*  BILITOT 0.3 0.4  PROT 7.5 6.8  ALBUMIN 3.3* 2.9*  CBG: Recent Labs  Lab 08/16/22 1843  GLUCAP 73   Lipid Profile: No  results for input(s): "CHOL", "HDL", "LDLCALC", "TRIG", "CHOLHDL", "LDLDIRECT" in the last 72 hours. Thyroid Function Tests: Recent Labs    08/16/22 1828  TSH 9.896*   Sepsis Labs: Recent Labs  Lab 08/16/22 1757 08/17/22 0611  LATICACIDVEN 2.1* 1.1    No results found for this or any previous visit (from the past 240 hour(s)).  Antimicrobials: Anti-infectives (From admission, onward)    Start     Dose/Rate Route Frequency Ordered Stop   08/17/22 1700  cefTRIAXone (ROCEPHIN) 1 g in sodium chloride 0.9 % 100 mL IVPB  1 g 200 mL/hr over 30 Minutes Intravenous Every 24 hours 08/16/22 2337     08/16/22 1930  cefTRIAXone (ROCEPHIN) 1 g in sodium chloride 0.9 % 100 mL IVPB        1 g 200 mL/hr over 30 Minutes Intravenous  Once 08/16/22 1928 08/16/22 2016      Culture/Microbiology    Component Value Date/Time   SDES URINE, CLEAN CATCH 06/26/2022 0647   SPECREQUEST  06/26/2022 0647    NONE Performed at [facility], [address] [place], [place] [address]    CULT 10,000 COLONIES/mL ENTEROCOCCUS FAECALIS (A) 06/26/2022 0647   REPTSTATUS 06/29/2022 FINAL 06/26/2022 0647   Radiology Studies: MR BRAIN WO CONTRAST  Result Date: 08/16/2022 CLINICAL DATA:  Initial evaluation for neuro deficit, stroke. EXAM: MRI HEAD WITHOUT CONTRAST TECHNIQUE: Multiplanar, multiecho pulse sequences of the brain and surrounding structures were obtained without intravenous contrast. COMPARISON:  CT from earlier the same day. FINDINGS: Brain: Moderately advanced cerebral atrophy, most pronounced about the frontal and temporal lobes bilaterally. Underlying mild-to-moderate chronic microvascular ischemic disease. No evidence for acute or subacute ischemia. Gray-white matter differentiation maintained. No areas of chronic cortical infarction. No acute or chronic intracranial blood products. No mass lesion, midline shift or mass effect. No hydrocephalus or extra-axial fluid collection. Empty sella  noted. Vascular: Major intracranial vascular flow voids are maintained. Skull and upper cervical spine: Craniocervical junction within normal limits. Bone marrow signal intensity normal. No scalp soft tissue abnormality. Sinuses/Orbits: Globes orbital soft tissues within normal limits. Paranasal sinuses are largely clear. No significant mastoid effusion. Other: None. IMPRESSION: 1. No acute intracranial abnormality. 2. Moderate frontotemporal predominant cerebral atrophy. 3. Underlying mild-to-moderate chronic microvascular ischemic disease. 4. Empty sella. Electronically Signed   By: Rise Mu M.D.   On: 08/16/2022 23:10   CT Head Wo Contrast  Result Date: 08/16/2022 CLINICAL DATA:  Neuro deficit EXAM: CT HEAD WITHOUT CONTRAST TECHNIQUE: Contiguous axial images were obtained from the base of the skull through the vertex without intravenous contrast. RADIATION DOSE REDUCTION: This exam was performed according to the departmental dose-optimization program which includes automated exposure control, adjustment of the mA and/or kV according to patient size and/or use of iterative reconstruction technique. COMPARISON:  CT brain 06/07/2022 FINDINGS: Brain: No acute territorial infarction, hemorrhage or intracranial mass. Moderate atrophy. Moderate chronic small vessel ischemic changes of the white matter. Stable ventricle size. Vascular: No hyperdense vessels.  Carotid vascular calcification Skull: Normal. Negative for fracture or focal lesion. Sinuses/Orbits: No acute finding. Other: None IMPRESSION: 1. No CT evidence for acute intracranial abnormality. 2. Atrophy and chronic small vessel ischemic changes of the white matter. Electronically Signed   By: Jasmine Pang M.D.   On: 08/16/2022 20:01   DG Chest Portable 1 View  Result Date: 08/16/2022 CLINICAL DATA:  Altered mental status EXAM: PORTABLE CHEST 1 VIEW COMPARISON:  06/27/2022 x-ray.  CTA 06/22/2022 FINDINGS: Stable enlarged cardiopericardial  silhouette. Calcified tortuous aorta. No consolidation, pneumothorax or effusion. No edema. Mild interstitial prominence. Overlapping cardiac leads. Chronic deformity of the right shoulder. Degenerative changes along the spine. IMPRESSION: Enlarged heart.  No consolidation or effusion. Electronically Signed   By: Karen Kays M.D.   On: 08/16/2022 18:37     LOS: 1 day   Lanae Boast, MD [facility]  08/17/2022, 11:36 AM

## 2022-08-17 NOTE — Progress Notes (Signed)
Pharmacy Antibiotic Note  Ann Cole is a 82 y.o. female admitted on 08/16/2022 with UTI.  Pharmacy has been consulted for unasyn dosing. Pt is afebrile and WBC is WNL. Lactic acid is WNL. Ceftriaxone per MD also started.   Plan: Unasyn 3g IV Q8H F/u renal fxn, C&S, clinical status De-escalate antibiotics as able  Height: 5' (152.4 cm) Weight: 90.7 kg (200 lb) IBW/kg (Calculated) : 45.5  Temp (24hrs), Avg:95.7 F (35.4 C), Min:91.8 F (33.2 C), Max:99 F (37.2 C)  Recent Labs  Lab 08/16/22 1757 08/16/22 1828 08/17/22 0336 08/17/22 0611  WBC  --  8.3 7.2  --   CREATININE  --  1.07* 1.05*  --   LATICACIDVEN 2.1*  --   --  1.1    Estimated Creatinine Clearance: 40 mL/min (A) (by C-G formula based on SCr of 1.05 mg/dL (H)).    No Known Allergies  Antimicrobials this admission: Unasyn 7/31>> CTX 7/30>>  Dose adjustments this admission: N/A  Microbiology results: Pending  Thank you for allowing pharmacy to be a part of this patient's care.  Lizbeth Feijoo, Drake Leach 08/17/2022 12:23 PM

## 2022-08-17 NOTE — ED Notes (Signed)
Pt to MRI

## 2022-08-17 NOTE — ED Notes (Signed)
ED TO INPATIENT HANDOFF REPORT  ED Nurse Name and Phone #: Julius Boniface [phone]  S Name/Age/Gender Ann Cole 82 y.o. female Room/Bed: 040C/040C  Code Status   Code Status: Full Code  Home/SNF/Other Nursing Home Patient oriented to: self Is this baseline? No   Triage Complete: Triage complete  Chief Complaint Acute metabolic encephalopathy [G93.41]  Triage Note Patient BIB EMS from memory care unit with c/o drowsiness, 116/60, A-fitter, CBG 100, 97%ra, 20grhand, left wrist with deformity from past fall. Denies any pain    Allergies No Known Allergies  Level of Care/Admitting Diagnosis ED Disposition     ED Disposition  Admit   Condition  --   Comment  Hospital Area: [facility] [[id number]]  Level of Care: Progressive [102]  Admit to Progressive based on following criteria: MULTISYSTEM THREATS such as stable sepsis, metabolic/electrolyte imbalance with or without encephalopathy that is responding to early treatment.  May admit patient to [facility] or [facility] if equivalent level of care is available:: No  Covid Evaluation: Asymptomatic - no recent exposure (last 10 days) testing not required  Diagnosis: Acute metabolic encephalopathy [[id number]]  Admitting Physician: Angie Fava [[id number]]  Attending Physician: Angie Fava [[id number]]  Certification:: I certify this patient will need inpatient services for at least 2 midnights  Estimated Length of Stay: 2          B Medical/Surgery History Past Medical History:  Diagnosis Date   Chronic diastolic heart failure (HCC)    Dementia (HCC)    Hyperlipidemia    Hypertension    Paroxysmal atrial fibrillation (HCC)    Thyroid disease    History reviewed. No pertinent surgical history.   A IV Location/Drains/Wounds Patient Lines/Drains/Airways Status     Active Line/Drains/Airways     Name Placement date Placement time Site Days   Peripheral IV 08/16/22 20 G Posterior;Right  Hand 08/16/22  1830  Hand  1   Wound / Incision (Open or Dehisced) 06/21/22 Other (Comment) Thigh Posterior;Proximal;Right 2 small stage 2 06/21/22  1000  Thigh  57   Wound / Incision (Open or Dehisced) 06/26/22 Other (Comment) Buttocks Right;Left Blanchable redness 06/26/22  0800  Buttocks  52            Intake/Output Last 24 hours  Intake/Output Summary (Last 24 hours) at 08/17/2022 1128 Last data filed at 08/16/2022 2016 Gross per 24 hour  Intake 100 ml  Output --  Net 100 ml    Labs/Imaging Results for orders placed or performed during the hospital encounter of 08/16/22 (from the past 48 hour(s))  Lactic acid, plasma     Status: Abnormal   Collection Time: 08/16/22  5:57 PM  Result Value Ref Range   Lactic Acid, Venous 2.1 (HH) 0.5 - 1.9 mmol/L    Comment: CRITICAL RESULT CALLED TO, READ BACK BY AND VERIFIED WITH FERNANDA F.,RN @1923  08/16/2022 [email or web address].J Performed at [facility], [address] [place], [place] [address]   Urinalysis, Routine w reflex microscopic -Urine, Clean Catch     Status: Abnormal   Collection Time: 08/16/22  6:25 PM  Result Value Ref Range   Color, Urine YELLOW YELLOW   APPearance HAZY (A) CLEAR   Specific Gravity, Urine 1.006 1.005 - 1.030   pH 5.0 5.0 - 8.0   Glucose, UA NEGATIVE NEGATIVE mg/dL   Hgb urine dipstick NEGATIVE NEGATIVE   Bilirubin Urine NEGATIVE NEGATIVE   Ketones, ur NEGATIVE NEGATIVE mg/dL   Protein, ur NEGATIVE NEGATIVE  mg/dL   Nitrite POSITIVE (A) NEGATIVE   Leukocytes,Ua LARGE (A) NEGATIVE   RBC / HPF 0-5 0 - 5 RBC/hpf   WBC, UA >50 0 - 5 WBC/hpf   Bacteria, UA MANY (A) NONE SEEN   Squamous Epithelial / HPF 0-5 0 - 5 /HPF   WBC Clumps PRESENT    Mucus PRESENT     Comment: Performed at [facility], [address], [place], [place] [address]  CBC with Differential     Status: Abnormal   Collection Time: 08/16/22  6:28 PM  Result Value Ref Range   WBC 8.3 4.0 - 10.5 K/uL   RBC 4.95 3.87 - 5.11 MIL/uL    Hemoglobin 14.5 12.0 - 15.0 g/dL   HCT 60.4 (H) 54.0 - 98.1 %   MCV 96.4 80.0 - 100.0 fL   MCH 29.3 26.0 - 34.0 pg   MCHC 30.4 30.0 - 36.0 g/dL   RDW 19.1 (H) 47.8 - 29.5 %   Platelets 240 150 - 400 K/uL   nRBC 0.0 0.0 - 0.2 %   Neutrophils Relative % 67 %   Neutro Abs 5.6 1.7 - 7.7 K/uL   Lymphocytes Relative 22 %   Lymphs Abs 1.8 0.7 - 4.0 K/uL   Monocytes Relative 9 %   Monocytes Absolute 0.8 0.1 - 1.0 K/uL   Eosinophils Relative 1 %   Eosinophils Absolute 0.1 0.0 - 0.5 K/uL   Basophils Relative 1 %   Basophils Absolute 0.1 0.0 - 0.1 K/uL   Immature Granulocytes 0 %   Abs Immature Granulocytes 0.03 0.00 - 0.07 K/uL    Comment: Performed at [facility], [address], [place], [place] [address]  Comprehensive metabolic panel     Status: Abnormal   Collection Time: 08/16/22  6:28 PM  Result Value Ref Range   Sodium 138 135 - 145 mmol/L   Potassium 4.7 3.5 - 5.1 mmol/L   Chloride 102 98 - 111 mmol/L   CO2 23 22 - 32 mmol/L   Glucose, Bld 84 70 - 99 mg/dL    Comment: Glucose reference range applies only to samples taken after fasting for at least 8 hours.   BUN 21 8 - 23 mg/dL   Creatinine, Ser 8.65 (H) 0.44 - 1.00 mg/dL   Calcium 9.7 8.9 - 78.4 mg/dL   Total Protein 7.5 6.5 - 8.1 g/dL   Albumin 3.3 (L) 3.5 - 5.0 g/dL   AST 32 15 - 41 U/L   ALT 27 0 - 44 U/L   Alkaline Phosphatase 142 (H) 38 - 126 U/L   Total Bilirubin 0.3 0.3 - 1.2 mg/dL   GFR, Estimated 51 (L) >60 mL/min    Comment: (NOTE) Calculated using the CKD-EPI Creatinine Equation (2021)    Anion gap 13 5 - 15    Comment: Performed at [facility], [address] [place], [place] [address]  TSH     Status: Abnormal   Collection Time: 08/16/22  6:28 PM  Result Value Ref Range   TSH 9.896 (H) 0.350 - 4.500 uIU/mL    Comment: Performed by a 3rd Generation assay with a functional sensitivity of <=0.01 uIU/mL. Performed at [facility], [address], [place], [place] [address]   CBG  monitoring, ED     Status: None   Collection Time: 08/16/22  6:43 PM  Result Value Ref Range   Glucose-Capillary 73 70 - 99 mg/dL    Comment: Glucose  reference range applies only to samples taken after fasting for at least 8 hours.  CBC with Differential/Platelet     Status: Abnormal   Collection Time: 08/17/22  3:36 AM  Result Value Ref Range   WBC 7.2 4.0 - 10.5 K/uL   RBC 4.76 3.87 - 5.11 MIL/uL   Hemoglobin 13.6 12.0 - 15.0 g/dL   HCT 41.6 60.6 - 30.1 %   MCV 91.4 80.0 - 100.0 fL   MCH 28.6 26.0 - 34.0 pg   MCHC 31.3 30.0 - 36.0 g/dL   RDW 60.1 (H) 09.3 - 23.5 %   Platelets 233 150 - 400 K/uL   nRBC 0.0 0.0 - 0.2 %   Neutrophils Relative % 68 %   Neutro Abs 4.9 1.7 - 7.7 K/uL   Lymphocytes Relative 20 %   Lymphs Abs 1.4 0.7 - 4.0 K/uL   Monocytes Relative 10 %   Monocytes Absolute 0.7 0.1 - 1.0 K/uL   Eosinophils Relative 1 %   Eosinophils Absolute 0.1 0.0 - 0.5 K/uL   Basophils Relative 1 %   Basophils Absolute 0.1 0.0 - 0.1 K/uL   Immature Granulocytes 0 %   Abs Immature Granulocytes 0.03 0.00 - 0.07 K/uL    Comment: Performed at [facility], [address] [place], [place] [address]  Comprehensive metabolic panel     Status: Abnormal   Collection Time: 08/17/22  3:36 AM  Result Value Ref Range   Sodium 138 135 - 145 mmol/L   Potassium 4.5 3.5 - 5.1 mmol/L   Chloride 101 98 - 111 mmol/L   CO2 25 22 - 32 mmol/L   Glucose, Bld 59 (L) 70 - 99 mg/dL    Comment: Glucose reference range applies only to samples taken after fasting for at least 8 hours.   BUN 20 8 - 23 mg/dL   Creatinine, Ser 0.25 (H) 0.44 - 1.00 mg/dL   Calcium 9.1 8.9 - 42.7 mg/dL   Total Protein 6.8 6.5 - 8.1 g/dL   Albumin 2.9 (L) 3.5 - 5.0 g/dL   AST 27 15 - 41 U/L   ALT 23 0 - 44 U/L   Alkaline Phosphatase 130 (H) 38 - 126 U/L   Total Bilirubin 0.4 0.3 - 1.2 mg/dL   GFR, Estimated 52 (L) >60 mL/min    Comment: (NOTE) Calculated using the CKD-EPI Creatinine Equation (2021)    Anion gap  12 5 - 15    Comment: Performed at [facility], [address], [place], [place] [address]  Magnesium     Status: None   Collection Time: 08/17/22  3:36 AM  Result Value Ref Range   Magnesium 1.8 1.7 - 2.4 mg/dL    Comment: Performed at [facility], [address] [place], [place] [address]  Lactic acid, plasma     Status: None   Collection Time: 08/17/22  6:11 AM  Result Value Ref Range   Lactic Acid, Venous 1.1 0.5 - 1.9 mmol/L    Comment: Performed at [facility], [address] [place], [place] [address]   MR BRAIN WO CONTRAST  Result Date: 08/16/2022 CLINICAL DATA:  Initial evaluation for neuro deficit, stroke. EXAM: MRI HEAD WITHOUT CONTRAST TECHNIQUE: Multiplanar, multiecho pulse sequences of the brain and surrounding structures were obtained without intravenous contrast. COMPARISON:  CT from earlier the same day. FINDINGS: Brain: Moderately advanced cerebral atrophy, most pronounced about the frontal and temporal lobes bilaterally. Underlying mild-to-moderate chronic microvascular  ischemic disease. No evidence for acute or subacute ischemia. Gray-white matter differentiation maintained. No areas of chronic cortical infarction. No acute or chronic intracranial blood products. No mass lesion, midline shift or mass effect. No hydrocephalus or extra-axial fluid collection. Empty sella noted. Vascular: Major intracranial vascular flow voids are maintained. Skull and upper cervical spine: Craniocervical junction within normal limits. Bone marrow signal intensity normal. No scalp soft tissue abnormality. Sinuses/Orbits: Globes orbital soft tissues within normal limits. Paranasal sinuses are largely clear. No significant mastoid effusion. Other: None. IMPRESSION: 1. No acute intracranial abnormality. 2. Moderate frontotemporal predominant cerebral atrophy. 3. Underlying mild-to-moderate chronic microvascular ischemic disease. 4. Empty sella. Electronically Signed   By: Rise Mu M.D.   On: 08/16/2022 23:10   CT Head Wo Contrast  Result Date: 08/16/2022 CLINICAL DATA:  Neuro deficit EXAM: CT HEAD WITHOUT CONTRAST TECHNIQUE: Contiguous axial images were obtained from the base of the skull through the vertex without intravenous contrast. RADIATION DOSE REDUCTION: This exam was performed according to the departmental dose-optimization program which includes automated exposure control, adjustment of the mA and/or kV according to patient size and/or use of iterative reconstruction technique. COMPARISON:  CT brain 06/07/2022 FINDINGS: Brain: No acute territorial infarction, hemorrhage or intracranial mass. Moderate atrophy. Moderate chronic small vessel ischemic changes of the white matter. Stable ventricle size. Vascular: No hyperdense vessels.  Carotid vascular calcification Skull: Normal. Negative for fracture or focal lesion. Sinuses/Orbits: No acute finding. Other: None IMPRESSION: 1. No CT evidence for acute intracranial abnormality. 2. Atrophy and chronic small vessel ischemic changes of the white matter. Electronically Signed   By: Jasmine Pang M.D.   On: 08/16/2022 20:01   DG Chest Portable 1 View  Result Date: 08/16/2022 CLINICAL DATA:  Altered mental status EXAM: PORTABLE CHEST 1 VIEW COMPARISON:  06/27/2022 x-ray.  CTA 06/22/2022 FINDINGS: Stable enlarged cardiopericardial silhouette. Calcified tortuous aorta. No consolidation, pneumothorax or effusion. No edema. Mild interstitial prominence. Overlapping cardiac leads. Chronic deformity of the right shoulder. Degenerative changes along the spine. IMPRESSION: Enlarged heart.  No consolidation or effusion. Electronically Signed   By: Karen Kays M.D.   On: 08/16/2022 18:37    Pending Labs Unresulted Labs (From admission, onward)     Start     Ordered   08/17/22 0732  Urine Culture (for pregnant, neutropenic or urologic patients or patients with an indwelling urinary catheter)  (Urine Labs)  Add-on,   AD        Question:  Indication  Answer:  Dysuria   08/17/22 0731   08/17/22 0545  Vitamin B12  Once,   R        08/17/22 0545   08/17/22 0531  Culture, OB Urine  Add-on,   AD        08/17/22 0531   08/16/22 1928  Urine Culture  Once,   URGENT       Question:  Indication  Answer:  Altered mental status (if no other cause identified)   08/16/22 1928            Vitals/Pain Today's Vitals   08/17/22 1030 08/17/22 1045 08/17/22 1100 08/17/22 1115  BP: 121/61 (!) 102/51 133/64 (!) 126/59  Pulse: 77 62 61 (!) 47  Resp: 16 14 15 17   Temp:      TempSrc:      SpO2: 98% 96% 97% 99%    Isolation Precautions No active isolations  Medications Medications  acetaminophen (TYLENOL) tablet 650 mg (has no administration in time range)  Or  acetaminophen (TYLENOL) suppository 650 mg (has no administration in time range)  melatonin tablet 3 mg (has no administration in time range)  ondansetron (ZOFRAN) injection 4 mg (has no administration in time range)  cefTRIAXone (ROCEPHIN) 1 g in sodium chloride 0.9 % 100 mL IVPB (has no administration in time range)  levothyroxine (SYNTHROID) tablet 112 mcg (112 mcg Oral Given 08/17/22 0617)  cefTRIAXone (ROCEPHIN) 1 g in sodium chloride 0.9 % 100 mL IVPB (0 g Intravenous Stopped 08/16/22 2016)    Mobility       Focused Assessments Neuro Assessment Handoff:  Swallow screen pass? Yes          Neuro Assessment: Exceptions to WDL (Per SNF, pt has been more drowsy than usually. Pt's baseline is confusion due to dementia, being oriented only to self) Neuro Checks:      Has TPA been given? No If patient is a Neuro Trauma and patient is going to OR before floor call report to 4N Charge nurse: [phone] or [phone]   R Recommendations: See Admitting Provider Note  Report given to:   Additional Notes:

## 2022-08-17 NOTE — ED Notes (Signed)
Pt feeling cool to touch. Rectal temperature checked. MD notified

## 2022-08-17 NOTE — Progress Notes (Signed)
Patient is A/Ox1 to self only. Unable to complete admission assessment in its entirety.

## 2022-08-18 DIAGNOSIS — G9341 Metabolic encephalopathy: Secondary | ICD-10-CM | POA: Diagnosis not present

## 2022-08-18 LAB — T4, FREE: Free T4: 1.14 ng/dL — ABNORMAL HIGH (ref 0.61–1.12)

## 2022-08-18 LAB — VITAMIN D 25 HYDROXY (VIT D DEFICIENCY, FRACTURES): Vit D, 25-Hydroxy: 52.17 ng/mL (ref 30–100)

## 2022-08-18 LAB — AMMONIA: Ammonia: 31 umol/L (ref 9–35)

## 2022-08-18 MED ORDER — METOPROLOL TARTRATE 5 MG/5ML IV SOLN: 5.0000 mg | INTRAVENOUS | Status: DC | PRN

## 2022-08-18 MED ORDER — MEMANTINE HCL 10 MG PO TABS
5.0000 mg | ORAL_TABLET | Freq: Two times a day (BID) | ORAL | Status: DC
  Administered 2022-08-18 – 2022-08-25 (×15): 5 mg via ORAL
  Filled 2022-08-18 (×15): qty 1

## 2022-08-18 MED ORDER — SERTRALINE HCL 50 MG PO TABS
50.0000 mg | ORAL_TABLET | Freq: Every morning | ORAL | Status: DC
  Administered 2022-08-18 – 2022-08-25 (×8): 50 mg via ORAL
  Filled 2022-08-18 (×8): qty 1

## 2022-08-18 MED ORDER — FERROUS SULFATE 325 (65 FE) MG PO TABS
325.0000 mg | ORAL_TABLET | Freq: Every day | ORAL | Status: DC
  Administered 2022-08-19 – 2022-08-25 (×7): 325 mg via ORAL
  Filled 2022-08-18 (×7): qty 1

## 2022-08-18 MED ORDER — SENNOSIDES-DOCUSATE SODIUM 8.6-50 MG PO TABS: 1.0000 | ORAL_TABLET | Freq: Every evening | ORAL | Status: DC | PRN

## 2022-08-18 MED ORDER — HYDRALAZINE HCL 20 MG/ML IJ SOLN: 10.0000 mg | INTRAMUSCULAR | Status: DC | PRN

## 2022-08-18 MED ORDER — IPRATROPIUM-ALBUTEROL 0.5-2.5 (3) MG/3ML IN SOLN: 3.0000 mL | RESPIRATORY_TRACT | Status: DC | PRN

## 2022-08-18 MED ORDER — TRAZODONE HCL 50 MG PO TABS
50.0000 mg | ORAL_TABLET | Freq: Every evening | ORAL | Status: DC | PRN
  Administered 2022-08-18: 50 mg via ORAL
  Filled 2022-08-18: qty 1

## 2022-08-18 NOTE — Plan of Care (Signed)
  Problem: Education: Goal: Knowledge of General Education information will improve Description Including pain rating scale, medication(s)/side effects and non-pharmacologic comfort measures Outcome: Progressing   Problem: Health Behavior/Discharge Planning: Goal: Ability to manage health-related needs will improve Outcome: Progressing   

## 2022-08-18 NOTE — Progress Notes (Signed)
PROGRESS NOTE    Ann Cole  UEA:[id number] DOB: [birth year] DOA: 08/16/2022 PCP: Ann Saint, MD   Brief Narrative:  82 year old with dementia, recent left wrist fracture, chronic CHF with preserved EF, paroxysmal A-fib not on anticoagulation due to increased risk of falls, hypothyroidism, depression comes to the hospital for evaluation of confusion and generalized weakness.  In the ER patient was noted to have urinary tract infection.  CT head and MRI brain were negative for acute pathology.  She was started on IV Rocephin.   Assessment & Plan:  Principal Problem:   Acute metabolic encephalopathy Active Problems:   Chronic diastolic heart failure (HCC)   Paroxysmal atrial fibrillation (HCC)   Acquired hypothyroidism   Acute cystitis   Severe sepsis (HCC)   Hypothermia   Sepsis (HCC)      Acute metabolic encephalopathy in the setting of UTI Dementia: Likely from underlying infection but also has dementia.  This has been exacerbated by urinary tract infection.  Getting IV antibiotics.  Will need PT/OT.   Severe sepsis due to UTI UTI: Sepsis physiology has improved.  Previous cultures reviewed which had shown Enterococcus faecalis.  Currently on empiric Rocephin and Unasyn. Follow culture data  Chronic CHF with preserved EF 65% back in June Severe pulmonary hypertension history: No obvious evidence of chest pain or respiratory distress at this time.  Continue to monitor.  Will resume home meds   PAF:  Beta-blocker on hold, will resume slowly .  Currently heart rate is in 60s not on anticoagulation due to fall risk   Hypothyroidism  continue Synthroid.  Elevated TSH, check free T4   DVT prophylaxis: SCDs Start: 08/16/22 2336 Code Status:   Code Status: Full Code Family Communication: Spoke with Ann Cole. She requested a call back later if possible Remains weak and on IV antibiotics.  Continue hospital stay      Diet Orders (From admission, onward)      Start     Ordered   08/16/22 2337  Diet regular Room service appropriate? Yes; Fluid consistency: Thin  Diet effective now       Question Answer Comment  Room service appropriate? Yes   Fluid consistency: Thin      08/16/22 2336            Subjective: Interpreter used.  Interpreter ID [id number] Patient was able to answer only very basic questions with the interpreter but did not participate in any meaningful conversation.  She kept staring at me despite of my instruction to look at the monitor and answer questions that were being asked by the interpreter. Eventually ended up calling the granddaughter who requested that I call back at a later time to get further history.  Examination:  General exam: Appears calm and comfortable  Respiratory system: Clear to auscultation. Respiratory effort normal. Cardiovascular system: S1 & S2 heard, RRR. No JVD, murmurs, rubs, gallops or clicks. No pedal edema. Gastrointestinal system: Abdomen is nondistended, soft and nontender. No organomegaly or masses felt. Normal bowel sounds heard. Central nervous system: Alert and oriented to name only.  No focal neurodeficits Extremities: Symmetric 5 x 5 power. Skin: No rashes, lesions or ulcers Psychiatry: Poor judgment and insight  Objective: Vitals:   08/17/22 2000 08/17/22 2334 08/18/22 0329 08/18/22 0500  BP: 133/69 (!) 146/67 (!) 162/81   Pulse: 80 79 62   Resp:   18   Temp: (!) 97.5 F (36.4 C) 97.7 F (36.5 C) 98.5 F (36.9 C)   TempSrc:  Axillary Axillary    SpO2: 99% 98% 98%   Weight:    93.4 kg  Height:        Intake/Output Summary (Last 24 hours) at 08/18/2022 0825 Last data filed at 08/18/2022 0400 Gross per 24 hour  Intake 364.54 ml  Output --  Net 364.54 ml   Filed Weights   08/17/22 1115 08/18/22 0500  Weight: 90.7 kg 93.4 kg    Scheduled Meds:  levothyroxine  112 mcg Oral Q0600   Continuous Infusions:  ampicillin-sulbactam (UNASYN) IV 3 g (08/18/22 0340)   cefTRIAXone  (ROCEPHIN)  IV 1 g (08/17/22 1817)    Nutritional status     Body mass index is 40.21 kg/m.  Data Reviewed:   CBC: Recent Labs  Lab 08/16/22 1828 08/17/22 0336  WBC 8.3 7.2  NEUTROABS 5.6 4.9  HGB 14.5 13.6  HCT 47.7* 43.5  MCV 96.4 91.4  PLT 240 233   Basic Metabolic Panel: Recent Labs  Lab 08/16/22 1828 08/17/22 0336  NA 138 138  K 4.7 4.5  CL 102 101  CO2 23 25  GLUCOSE 84 59*  BUN 21 20  CREATININE 1.07* 1.05*  CALCIUM 9.7 9.1  MG  --  1.8   GFR: Estimated Creatinine Clearance: 40.7 mL/min (A) (by C-G formula based on SCr of 1.05 mg/dL (H)). Liver Function Tests: Recent Labs  Lab 08/16/22 1828 08/17/22 0336  AST 32 27  ALT 27 23  ALKPHOS 142* 130*  BILITOT 0.3 0.4  PROT 7.5 6.8  ALBUMIN 3.3* 2.9*   No results for input(s): "LIPASE", "AMYLASE" in the last 168 hours. No results for input(s): "AMMONIA" in the last 168 hours. Coagulation Profile: No results for input(s): "INR", "PROTIME" in the last 168 hours. Cardiac Enzymes: No results for input(s): "CKTOTAL", "CKMB", "CKMBINDEX", "TROPONINI" in the last 168 hours. BNP (last 3 results) No results for input(s): "PROBNP" in the last 8760 hours. HbA1C: No results for input(s): "HGBA1C" in the last 72 hours. CBG: Recent Labs  Lab 08/16/22 1843  GLUCAP 73   Lipid Profile: No results for input(s): "CHOL", "HDL", "LDLCALC", "TRIG", "CHOLHDL", "LDLDIRECT" in the last 72 hours. Thyroid Function Tests: Recent Labs    08/16/22 1828  TSH 9.896*   Anemia Panel: Recent Labs    08/17/22 1642  VITAMINB12 827   Sepsis Labs: Recent Labs  Lab 08/16/22 1757 08/17/22 0611  LATICACIDVEN 2.1* 1.1    No results found for this or any previous visit (from the past 240 hour(s)).       Radiology Studies: MR BRAIN WO CONTRAST  Result Date: 08/16/2022 CLINICAL DATA:  Initial evaluation for neuro deficit, stroke. EXAM: MRI HEAD WITHOUT CONTRAST TECHNIQUE: Multiplanar, multiecho pulse sequences of  the brain and surrounding structures were obtained without intravenous contrast. COMPARISON:  CT from earlier the same day. FINDINGS: Brain: Moderately advanced cerebral atrophy, most pronounced about the frontal and temporal lobes bilaterally. Underlying mild-to-moderate chronic microvascular ischemic disease. No evidence for acute or subacute ischemia. Gray-white matter differentiation maintained. No areas of chronic cortical infarction. No acute or chronic intracranial blood products. No mass lesion, midline shift or mass effect. No hydrocephalus or extra-axial fluid collection. Empty sella noted. Vascular: Major intracranial vascular flow voids are maintained. Skull and upper cervical spine: Craniocervical junction within normal limits. Bone marrow signal intensity normal. No scalp soft tissue abnormality. Sinuses/Orbits: Globes orbital soft tissues within normal limits. Paranasal sinuses are largely clear. No significant mastoid effusion. Other: None. IMPRESSION: 1. No acute intracranial abnormality. 2.  Moderate frontotemporal predominant cerebral atrophy. 3. Underlying mild-to-moderate chronic microvascular ischemic disease. 4. Empty sella. Electronically Signed   By: Rise Mu M.D.   On: 08/16/2022 23:10   CT Head Wo Contrast  Result Date: 08/16/2022 CLINICAL DATA:  Neuro deficit EXAM: CT HEAD WITHOUT CONTRAST TECHNIQUE: Contiguous axial images were obtained from the base of the skull through the vertex without intravenous contrast. RADIATION DOSE REDUCTION: This exam was performed according to the departmental dose-optimization program which includes automated exposure control, adjustment of the mA and/or kV according to patient size and/or use of iterative reconstruction technique. COMPARISON:  CT brain 06/07/2022 FINDINGS: Brain: No acute territorial infarction, hemorrhage or intracranial mass. Moderate atrophy. Moderate chronic small vessel ischemic changes of the white matter. Stable  ventricle size. Vascular: No hyperdense vessels.  Carotid vascular calcification Skull: Normal. Negative for fracture or focal lesion. Sinuses/Orbits: No acute finding. Other: None IMPRESSION: 1. No CT evidence for acute intracranial abnormality. 2. Atrophy and chronic small vessel ischemic changes of the white matter. Electronically Signed   By: Jasmine Pang M.D.   On: 08/16/2022 20:01   DG Chest Portable 1 View  Result Date: 08/16/2022 CLINICAL DATA:  Altered mental status EXAM: PORTABLE CHEST 1 VIEW COMPARISON:  06/27/2022 x-ray.  CTA 06/22/2022 FINDINGS: Stable enlarged cardiopericardial silhouette. Calcified tortuous aorta. No consolidation, pneumothorax or effusion. No edema. Mild interstitial prominence. Overlapping cardiac leads. Chronic deformity of the right shoulder. Degenerative changes along the spine. IMPRESSION: Enlarged heart.  No consolidation or effusion. Electronically Signed   By: Karen Kays M.D.   On: 08/16/2022 18:37           LOS: 2 days   Time spent= 35 mins    Nadezhda Pollitt Joline Maxcy, MD [facility]  If 7PM-7AM, please contact night-coverage  08/18/2022, 8:25 AM

## 2022-08-18 NOTE — Plan of Care (Signed)
Pt responds to name. Minimal speech. Spanish speaking unable to follow direction. Hx dementia. Vitals stable. Temp stable.  Problem: Education: Goal: Knowledge of General Education information will improve Description: Including pain rating scale, medication(s)/side effects and non-pharmacologic comfort measures Outcome: Progressing   Problem: Health Behavior/Discharge Planning: Goal: Ability to manage health-related needs will improve Outcome: Progressing   Problem: Clinical Measurements: Goal: Ability to maintain clinical measurements within normal limits will improve Outcome: Progressing Goal: Will remain free from infection Outcome: Progressing Goal: Diagnostic test results will improve Outcome: Progressing Goal: Respiratory complications will improve Outcome: Progressing Goal: Cardiovascular complication will be avoided Outcome: Progressing   Problem: Activity: Goal: Risk for activity intolerance will decrease Outcome: Progressing   Problem: Nutrition: Goal: Adequate nutrition will be maintained Outcome: Progressing   Problem: Coping: Goal: Level of anxiety will decrease Outcome: Progressing   Problem: Elimination: Goal: Will not experience complications related to bowel motility Outcome: Progressing Goal: Will not experience complications related to urinary retention Outcome: Progressing   Problem: Pain Managment: Goal: General experience of comfort will improve Outcome: Progressing   Problem: Safety: Goal: Ability to remain free from injury will improve Outcome: Progressing   Problem: Skin Integrity: Goal: Risk for impaired skin integrity will decrease Outcome: Progressing

## 2022-08-19 DIAGNOSIS — G9341 Metabolic encephalopathy: Secondary | ICD-10-CM | POA: Diagnosis not present

## 2022-08-19 MED ORDER — SODIUM CHLORIDE 0.9 % IV SOLN
3.0000 g | Freq: Three times a day (TID) | INTRAVENOUS | Status: DC
  Administered 2022-08-19 – 2022-08-21 (×7): 3 g via INTRAVENOUS
  Filled 2022-08-19 (×7): qty 8

## 2022-08-19 NOTE — Plan of Care (Signed)

## 2022-08-19 NOTE — Progress Notes (Signed)
PROGRESS NOTE    Ann Cole  RUE:[id number] DOB: [birth year] DOA: 08/16/2022 PCP: Deeann Saint, MD   Brief Narrative:  82 year old with dementia, recent left wrist fracture, chronic CHF with preserved EF, paroxysmal A-fib not on anticoagulation due to increased risk of falls, hypothyroidism, depression comes to the hospital for evaluation of confusion and generalized weakness.  In the ER patient was noted to have urinary tract infection.  CT head and MRI brain were negative for acute pathology.  She was started on IV Rocephin.  Eventually cultures grew E. coli which was sensitive to Unasyn.   Assessment & Plan:  Principal Problem:   Acute metabolic encephalopathy Active Problems:   Chronic diastolic heart failure (HCC)   Paroxysmal atrial fibrillation (HCC)   Acquired hypothyroidism   Acute cystitis   Severe sepsis (HCC)   Hypothermia   Sepsis (HCC)      Acute metabolic encephalopathy in the setting of UTI Dementia: Likely from underlying infection but also has dementia.  Eventually her urine cultures grew E. coli which is sensitive to Unasyn therefore we will continue that.  It is not sensitive to Rocephin.   Severe sepsis due to UTI UTI: Sepsis physiology has improved.  Previous cultures reviewed which had shown Enterococcus faecalis.  Currently on empiric Rocephin and Unasyn. Follow culture data  Chronic CHF with preserved EF 65% back in June Severe pulmonary hypertension history: No obvious evidence of chest pain or respiratory distress at this time.  Continue to monitor.  Will resume home meds   PAF:  Beta-blocker on hold, will resume slowly .  Currently heart rate is in 60s not on anticoagulation due to fall risk   Hypothyroidism  continue Synthroid.  Elevated TSH, check free T4   DVT prophylaxis: SCDs Start: 08/16/22 2336 Code Status:   Code Status: Full Code Family Communication: attempted to call [name] but no answer.  Remains weak and on IV  antibiotics.  Continue hospital stay      Diet Orders (From admission, onward)     Start     Ordered   08/16/22 2337  Diet regular Room service appropriate? Yes; Fluid consistency: Thin  Diet effective now       Question Answer Comment  Room service appropriate? Yes   Fluid consistency: Thin      08/16/22 2336            Subjective: Bedside hospital Interpreter used.  Ptn has no compliants Tells me her name and says feels good but confused otherwise. Follows only basic commands.   Examination:  Constitutional: Not in acute distress Respiratory: Clear to auscultation bilaterally Cardiovascular: Normal sinus rhythm, no rubs Abdomen: Nontender nondistended good bowel sounds Musculoskeletal: No edema noted Skin: No rashes seen Neurologic: CN 2-12 grossly intact.  And nonfocal Psychiatric: Poor judgment and insight. Alert and oriented x name only   Objective: Vitals:   08/19/22 0421 08/19/22 0426 08/19/22 0822 08/19/22 1127  BP: (!) 140/64  139/66 (!) 149/74  Pulse: 82  83 83  Resp:   16 17  Temp: 97.7 F (36.5 C)  97.7 F (36.5 C) 97.9 F (36.6 C)  TempSrc: Axillary   Oral  SpO2: 96%  97% 100%  Weight:  93.6 kg    Height:        Intake/Output Summary (Last 24 hours) at 08/19/2022 1237 Last data filed at 08/19/2022 0847 Gross per 24 hour  Intake 334.54 ml  Output 1100 ml  Net -765.46 ml   [employer]  08/17/22 1115 08/18/22 0500 08/19/22 0426  Weight: 90.7 kg 93.4 kg 93.6 kg    Scheduled Meds:  ferrous sulfate  325 mg Oral Q breakfast   levothyroxine  112 mcg Oral Q0600   memantine  5 mg Oral BID   sertraline  50 mg Oral q morning   Continuous Infusions:  ampicillin-sulbactam (UNASYN) IV      Nutritional status     Body mass index is 40.3 kg/m.  Data Reviewed:   CBC: Recent Labs  Lab 08/16/22 1828 08/17/22 0336  WBC 8.3 7.2  NEUTROABS 5.6 4.9  HGB 14.5 13.6  HCT 47.7* 43.5  MCV 96.4 91.4  PLT 240 233   Basic Metabolic  Panel: Recent Labs  Lab 08/16/22 1828 08/17/22 0336 08/19/22 0306  NA 138 138 139  K 4.7 4.5 3.8  CL 102 101 104  CO2 23 25 23   GLUCOSE 84 59* 81  BUN 21 20 12   CREATININE 1.07* 1.05* 1.07*  CALCIUM 9.7 9.1 9.1  MG  --  1.8 1.9   GFR: Estimated Creatinine Clearance: 40 mL/min (A) (by C-G formula based on SCr of 1.07 mg/dL (H)). Liver Function Tests: Recent Labs  Lab 08/16/22 1828 08/17/22 0336  AST 32 27  ALT 27 23  ALKPHOS 142* 130*  BILITOT 0.3 0.4  PROT 7.5 6.8  ALBUMIN 3.3* 2.9*   No results for input(s): "LIPASE", "AMYLASE" in the last 168 hours. Recent Labs  Lab 08/18/22 0854  AMMONIA 31   Coagulation Profile: No results for input(s): "INR", "PROTIME" in the last 168 hours. Cardiac Enzymes: No results for input(s): "CKTOTAL", "CKMB", "CKMBINDEX", "TROPONINI" in the last 168 hours. BNP (last 3 results) No results for input(s): "PROBNP" in the last 8760 hours. HbA1C: No results for input(s): "HGBA1C" in the last 72 hours. CBG: Recent Labs  Lab 08/16/22 1843  GLUCAP 73   Lipid Profile: No results for input(s): "CHOL", "HDL", "LDLCALC", "TRIG", "CHOLHDL", "LDLDIRECT" in the last 72 hours. Thyroid Function Tests: Recent Labs    08/16/22 1828 08/18/22 0854  TSH 9.896*  --   FREET4  --  1.14*   Anemia Panel: Recent Labs    08/17/22 1642  VITAMINB12 827   Sepsis Labs: Recent Labs  Lab 08/16/22 1757 08/17/22 0611  LATICACIDVEN 2.1* 1.1    Recent Results (from the past 240 hour(s))  Urine Culture     Status: Abnormal   Collection Time: 08/16/22  6:25 PM   Specimen: Urine, Catheterized  Result Value Ref Range Status   Specimen Description URINE, CATHETERIZED  Final   Special Requests   Final    NONE Performed at [facility], [address], [place], [place] [address]    Culture (A)  Final    >=100,000 COLONIES/mL ESCHERICHIA COLI Confirmed Extended Spectrum Beta-Lactamase Producer (ESBL).  In bloodstream infections from ESBL  organisms, carbapenems are preferred over piperacillin/tazobactam. They are shown to have a lower risk of mortality.    Report Status 08/19/2022 FINAL  Final   Organism ID, Bacteria ESCHERICHIA COLI (A)  Final      Susceptibility   Escherichia coli - MIC*    AMPICILLIN >=32 RESISTANT Resistant     CEFAZOLIN >=64 RESISTANT Resistant     CEFEPIME 0.5 SENSITIVE Sensitive     CEFTRIAXONE >=64 RESISTANT Resistant     CIPROFLOXACIN >=4 RESISTANT Resistant     GENTAMICIN <=1 SENSITIVE Sensitive     IMIPENEM <=0.25 SENSITIVE Sensitive     NITROFURANTOIN <=16 SENSITIVE Sensitive  TRIMETH/SULFA <=20 SENSITIVE Sensitive     AMPICILLIN/SULBACTAM <=2 SENSITIVE Sensitive     PIP/TAZO <=4 SENSITIVE Sensitive     * >=100,000 COLONIES/mL ESCHERICHIA COLI         Radiology Studies: No results found.         LOS: 3 days   Time spent= 35 mins    Yohan Samons Joline Maxcy, MD [facility]  If 7PM-7AM, please contact night-coverage  08/19/2022, 12:37 PM

## 2022-08-19 NOTE — Care Management Important Message (Signed)
Important Message  Patient Details  Name: Ann Cole MRN: [id number] Date of Birth: 1940/04/15   Medicare Important Message Given:  Yes     Dorena Bodo 08/19/2022, 2:16 PM

## 2022-08-19 NOTE — Evaluation (Signed)
Physical Therapy Evaluation Patient Details Name: Ann Cole MRN: [id number] DOB: 24-Oct-1940 Today's Date: 08/19/2022  History of Present Illness  Pt is an 82 y/o female presenting 7/30 with acute metabolic encephalopathy and sepsis in setting of UTI. PMH: CHF, dementia, PAF, hypothyroidism, "recent" L wrist fx.  Clinical Impression  Pt admitted with above diagnosis. Max-Total assist +2 for bed mobility and sit<>stand transitions from bed. Inconsistent with simple commands. In-house interpreter allowed for better engagement from patient today. Heavy posterior lean intermittently at EOB, and at all times in standing. Did progress to min guard seated EOB. Per prior notes it appears pt was ambulatory this year. May benefit from PT follow-up at memory care if family agreeable and facility able to provide in this setting. Pt currently with functional limitations due to the deficits listed below (see PT Problem List). Pt will benefit from acute skilled PT to increase their independence and safety with mobility to allow discharge.           If plan is discharge home, recommend the following: Other (comment) (Not appropriate for home, from memory care)   Can travel by private vehicle   No    Equipment Recommendations None recommended by PT  Recommendations for Other Services       Functional Status Assessment Patient has had a recent decline in their functional status and demonstrates the ability to make significant improvements in function in a reasonable and predictable amount of time.     Precautions / Restrictions Precautions Precautions: Fall Restrictions Weight Bearing Restrictions: No Other Position/Activity Restrictions: recent L wrist fx per chart; obvious deformity to L wrist though no WB orders present      Mobility  Bed Mobility Overal bed mobility: Needs Assistance Bed Mobility: Supine to Sit, Sit to Supine     Supine to sit: Total assist, +2 for physical  assistance, +2 for safety/equipment, HOB elevated Sit to supine: Total assist, +2 for physical assistance, +2 for safety/equipment, HOB elevated   General bed mobility comments: agreeable for EOB though did not initiate task requiring Total A x 2 for all bed mobility. Assisted LEs and trunk to EOB, bed pad to scoot laterally along bed.    Transfers Overall transfer level: Needs assistance Equipment used: 2 person hand held assist, Rolling walker (2 wheels) Transfers: Sit to/from Stand Sit to Stand: Total assist, +2 physical assistance, +2 safety/equipment           General transfer comment: Total A x2  for standing attempt at bedside with heavy posterior lean, requiring bed pad to maintain support/posture, no knee buckling noted. Total A x 2 for scooting along bedside. Trialed RW with RUE grip sustained but did not improve facilitation of forward lean.    Ambulation/Gait                  Stairs            Wheelchair Mobility     Tilt Bed    Modified Rankin (Stroke Patients Only)       Balance Overall balance assessment: Needs assistance Sitting-balance support: No upper extremity supported, Feet supported Sitting balance-Leahy Scale: Poor Sitting balance - Comments: initially heavy posterior lean with Mod/Max A to correct but able to progress to min guard. Postural control: Posterior lean Standing balance support: During functional activity, Single extremity supported Standing balance-Leahy Scale: Zero Standing balance comment: Pushing posteriorly  Pertinent Vitals/Pain Pain Assessment Pain Assessment: Faces Faces Pain Scale: Hurts little more Pain Location: L wrist Pain Descriptors / Indicators: Grimacing Pain Intervention(s): Monitored during session, Repositioned    Home Living Family/patient expects to be discharged to:: Other (Comment)                   Additional Comments: Pt from memory care.     Prior Function Prior Level of Function : Patient poor historian/Family not available             Mobility Comments: per chart, pt ambulatory in April 2024 but sharp decline and nonambulatory since admission June 2024 ADLs Comments: unsure of ADL baseline     Hand Dominance   Dominant Hand: Right    Extremity/Trunk Assessment   Upper Extremity Assessment Upper Extremity Assessment: Defer to OT evaluation LUE Deficits / Details: L wrist deformity evident - no current brace or WB orders present LUE Coordination: decreased fine motor    Lower Extremity Assessment Lower Extremity Assessment: Generalized weakness;Difficult to assess due to impaired cognition (Able to bear weight in standing bil, however heavy posterior lean.)    Cervical / Trunk Assessment Cervical / Trunk Assessment: Normal  Communication   Communication: No difficulties;Interpreter utilized;Other (comment) (in person interpreter)  Cognition Arousal/Alertness: Awake/alert Behavior During Therapy: Flat affect Overall Cognitive Status: History of cognitive impairments - at baseline                                 General Comments: hx of dementia, fairly appropriate responses with in person interpreter though inconsistent command following w/ multimodal cues to initiate tasks needed.        General Comments General comments (skin integrity, edema, etc.): In house interpreter used.    Exercises     Assessment/Plan    PT Assessment Patient needs continued PT services  PT Problem List Decreased strength;Decreased range of motion;Decreased balance;Decreased activity tolerance;Decreased mobility;Decreased cognition;Decreased coordination;Decreased knowledge of use of DME;Decreased safety awareness;Decreased knowledge of precautions;Obesity;Pain       PT Treatment Interventions DME instruction;Gait training;Functional mobility training;Therapeutic activities;Therapeutic exercise;Balance  training;Neuromuscular re-education;Cognitive remediation;Patient/family education    PT Goals (Current goals can be found in the Care Plan section)  Acute Rehab PT Goals Patient Stated Goal: n/a PT Goal Formulation: Patient unable to participate in goal setting Time For Goal Achievement: 09/02/22 Potential to Achieve Goals: Fair    Frequency Min 1X/week     Co-evaluation PT/OT/SLP Co-Evaluation/Treatment: Yes Reason for Co-Treatment: To address functional/ADL transfers;For patient/therapist safety;Necessary to address cognition/behavior during functional activity PT goals addressed during session: Mobility/safety with mobility;Balance;Proper use of DME;Strengthening/ROM OT goals addressed during session: ADL's and self-care;Strengthening/ROM       AM-PAC PT "6 Clicks" Mobility  Outcome Measure Help needed turning from your back to your side while in a flat bed without using bedrails?: A Lot Help needed moving from lying on your back to sitting on the side of a flat bed without using bedrails?: Total Help needed moving to and from a bed to a chair (including a wheelchair)?: Total Help needed standing up from a chair using your arms (e.g., wheelchair or bedside chair)?: Total Help needed to walk in hospital room?: Total Help needed climbing 3-5 steps with a railing? : Total 6 Click Score: 7    End of Session Equipment Utilized During Treatment: Gait belt Activity Tolerance: Other (comment) (limited by impaired cognitive status.) Patient left: in bed;with  call bell/phone within reach;with bed alarm set;with nursing/sitter in room;with SCD's reapplied Nurse Communication: Mobility status;Need for lift equipment PT Visit Diagnosis: Unsteadiness on feet (R26.81);Muscle weakness (generalized) (M62.81);History of falling (Z91.81);Difficulty in walking, not elsewhere classified (R26.2);Other symptoms and signs involving the nervous system (R29.898);Pain Pain - Right/Left: Left Pain -  part of body: Hand    Time: 3086-5784 PT Time Calculation (min) (ACUTE ONLY): 20 min   Charges:   PT Evaluation $PT Eval Moderate Complexity: 1 Mod   PT General Charges $$ ACUTE PT VISIT: 1 Visit         Kathlyn Sacramento, PT, DPT [facility]  Rehabilitation Services Physical Therapist Office: [phone] Website: [email or web address]   Berton Mount 08/19/2022, 1:03 PM

## 2022-08-19 NOTE — TOC Initial Note (Signed)
Transition of Care [facility]) - Initial/Assessment Note    Patient Details  Name: Ann Cole MRN: [id number] Date of Birth: October 18, 1940  [facility] CM/SW Contact:    Ann Cole, [place] Phone Number: 08/19/2022, 10:10 AM  Clinical Narrative:  Pt admitted from [facility] ALF/Memory Care. Spoke to pt's granddtr Ann Cole who confirmed plan is for return at dc. Per Ann Cole at [facility] [phone], pt is able to return but not over weekend. Will need updated FL2 at dc. SW will follow.   Ann Cole, MSW, LCSW [phone] (coverage)                   Expected Discharge Plan: Assisted Living Barriers to Discharge: Continued Medical Work up   Patient Goals and CMS Choice            Expected Discharge Plan and Services       Living arrangements for the past 2 months: Assisted Living Facility                                      Prior Living Arrangements/Services Living arrangements for the past 2 months: Assisted Living Facility Lives with:: Facility Resident Patient language and need for interpreter reviewed:: Yes Do you feel safe going back to the place where you live?: Yes      Need for Family Participation in Patient Care: Yes (Comment) Care giver support system in place?: Yes (comment)   Criminal Activity/Legal Involvement Pertinent to Current Situation/Hospitalization: No - Comment as needed  Activities of Daily Living      Permission Sought/Granted Permission sought to share information with : Oceanographer granted to share information with : Yes, Verbal Permission Granted              Emotional Assessment       Orientation: : Fluctuating Orientation (Suspected and/or reported Sundowners) Alcohol / Substance Use: Not Applicable Psych Involvement: No (comment)  Admission diagnosis:  Acute cystitis without hematuria [N30.00] Hypothermia, initial encounter [T68.XXXA] Sepsis  (HCC) [A41.9] Acute metabolic encephalopathy [G93.41] Patient Active Problem List   Diagnosis Date Noted   Severe sepsis (HCC) 08/17/2022   Hypothermia 08/17/2022   Sepsis (HCC) 08/17/2022   Acute metabolic encephalopathy 08/16/2022   Class 3 obesity (HCC) 06/21/2022   CHF (congestive heart failure) (HCC) 06/20/2022   Acute cystitis 04/27/2021   Acute on chronic anemia 04/27/2021   Humeral head fracture, right, closed, initial encounter 04/27/2021   Acquired hypothyroidism    Paroxysmal atrial fibrillation (HCC)    Chronic diastolic heart failure (HCC)    CAP (community acquired pneumonia) 04/26/2021   Syncope 08/23/2018   COVID-19 virus infection 08/23/2018   Dementia (HCC) 01/05/2018   Essential hypertension 01/05/2018   PCP:  Deeann Saint, MD Pharmacy:  No Pharmacies Listed    Social Determinants of Health (SDOH) Social History: SDOH Screenings   Food Insecurity: Patient Unable To Answer (06/25/2022)  Housing: Patient Unable To Answer (06/25/2022)  Transportation Needs: Patient Unable To Answer (06/25/2022)  Utilities: Patient Unable To Answer (06/25/2022)  Tobacco Use: Low Risk  (08/17/2022)   SDOH Interventions:     Readmission Risk Interventions     No data to display

## 2022-08-19 NOTE — Evaluation (Signed)
Occupational Therapy Evaluation Patient Details Name: Ann Cole MRN: [id number] DOB: 06/05/1940 Today's Date: 08/19/2022   History of Present Illness Pt is an 82 y/o female presenting with acute metabolic encephalopathy and sepsis in setting of UTI. PMH: CHF, dementia, PAF, hypothyroidism, "recent" L wrist fx.   Clinical Impression   PTA, pt from [facility] memory care, ambulatory around April-May 2024 with sharp functional decline per chart. Pt presents now with deficits in cognition, speech, strength and sitting/standing balance. Utilized in-person interpreter with improved interactions noted though inconsistent command following noted. Overall, pt requires Total A x 2 for bed mobility, brief standing attempts with heavy posterior bias and requires Max-Total A for ADLs. Pt endorses some pain in L wrist (recent wrist fx per chart and noted deformity). Will follow on a trial basis for basic ADLs, sitting balance and command following. Recommend DC back to memory care once medically stable.     Recommendations for follow up therapy are one component of a multi-disciplinary discharge planning process, led by the attending physician.  Recommendations may be updated based on patient status, additional functional criteria and insurance authorization.   Assistance Recommended at Discharge Frequent or constant Supervision/Assistance  Patient can return home with the following Two people to help with walking and/or transfers;A lot of help with bathing/dressing/bathroom;Two people to help with bathing/dressing/bathroom;Assistance with feeding    Functional Status Assessment  Patient has had a recent decline in their functional status and demonstrates the ability to make significant improvements in function in a reasonable and predictable amount of time.  Equipment Recommendations  None recommended by OT    Recommendations for Other Services       Precautions / Restrictions  Precautions Precautions: Fall Restrictions Weight Bearing Restrictions: No Other Position/Activity Restrictions: recent L wrist fx per chart; obvious deformity to L wrist though no WB orders present      Mobility Bed Mobility Overal bed mobility: Needs Assistance Bed Mobility: Supine to Sit, Sit to Supine     Supine to sit: Total assist, +2 for physical assistance, +2 for safety/equipment, HOB elevated Sit to supine: Total assist, +2 for physical assistance, +2 for safety/equipment, HOB elevated   General bed mobility comments: agreeable for EOB though did not initiate task requiring Total A x 2 for all bed mobility.    Transfers Overall transfer level: Needs assistance Equipment used: 2 person hand held assist Transfers: Sit to/from Stand Sit to Stand: Total assist, +2 physical assistance, +2 safety/equipment           General transfer comment: Total A x2  for standing attempt at bedside with heavy posterior lean, requiring bed pad to maintain support/posture, no knee buckling noted. Total A x 2 for scooting along bedside      Balance Overall balance assessment: Needs assistance Sitting-balance support: No upper extremity supported, Feet supported Sitting balance-Leahy Scale: Poor Sitting balance - Comments: initially heavy posterior lean with Mod/Max A to correct but able to progress to min guard. Postural control: Posterior lean Standing balance support: During functional activity, Single extremity supported Standing balance-Leahy Scale: Poor                             ADL either performed or assessed with clinical judgement   ADL Overall ADL's : Needs assistance/impaired Eating/Feeding: Maximal assistance;Bed level Eating/Feeding Details (indicate cue type and reason): nursing reported assisting pt with breakfast; food particles noted in mouth; able to sip from straw  with assist Grooming: Maximal assistance;Bed level   Upper Body Bathing: Total  assistance;Bed level   Lower Body Bathing: Total assistance;Bed level   Upper Body Dressing : Maximal assistance;Bed level   Lower Body Dressing: Total assistance;Bed level       Toileting- Clothing Manipulation and Hygiene: Total assistance;Bed level               Vision Ability to See in Adequate Light: 0 Adequate (appearsWFL) Patient Visual Report: No change from baseline;Other (comment) (appears [facility]) Vision Assessment?: No apparent visual deficits     Perception     Praxis      Pertinent Vitals/Pain Pain Assessment Pain Assessment: Faces Faces Pain Scale: Hurts little more Pain Location: L wrist Pain Descriptors / Indicators: Grimacing Pain Intervention(s): Monitored during session, Limited activity within patient's tolerance, Repositioned     Hand Dominance Right   Extremity/Trunk Assessment Upper Extremity Assessment Upper Extremity Assessment: LUE deficits/detail LUE Deficits / Details: L wrist deformity evident - no current brace or WB orders present LUE Coordination: decreased fine motor   Lower Extremity Assessment Lower Extremity Assessment: Defer to PT evaluation   Cervical / Trunk Assessment Cervical / Trunk Assessment: Normal   Communication Communication Communication: No difficulties;Interpreter utilized;Other (comment) (in person interpreter)   Cognition Arousal/Alertness: Awake/alert Behavior During Therapy: Flat affect Overall Cognitive Status: History of cognitive impairments - at baseline                                 General Comments: hx of dementia, fairly appropriate responses with in person interpreter though inconsistent command following w/ multimodal cues to initiate tasks needed.     General Comments       Exercises     Shoulder Instructions      Home Living Family/patient expects to be discharged to:: Other (Comment)                                 Additional Comments: Pt from memory  care.      Prior Functioning/Environment Prior Level of Function : Patient poor historian/Family not available             Mobility Comments: per chart, pt ambulatory in April 2024 but sharp decline and nonambulatory since admission June 2024 ADLs Comments: unsure of ADL baseline        OT Problem List: Decreased strength;Decreased activity tolerance;Impaired balance (sitting and/or standing);Decreased coordination;Decreased cognition;Impaired UE functional use;Pain      OT Treatment/Interventions: Self-care/ADL training;Therapeutic exercise;Energy conservation;DME and/or AE instruction;Therapeutic activities    OT Goals(Current goals can be found in the care plan section) Acute Rehab OT Goals Patient Stated Goal: none stated OT Goal Formulation: Patient unable to participate in goal setting Time For Goal Achievement: 09/02/22 Potential to Achieve Goals: Fair ADL Goals Pt Will Perform Eating: with min assist;sitting;bed level Pt Will Perform Grooming: with min assist;bed level;sitting Additional ADL Goal #1: Pt to follow > 50% of one step commands during ADLs/transfers Additional ADL Goal #2: Pt to demo ability to maintain sitting balance > 10 min with no more than Supervision during functional tasks  OT Frequency: Min 1X/week    Co-evaluation PT/OT/SLP Co-Evaluation/Treatment: Yes Reason for Co-Treatment: To address functional/ADL transfers;For patient/therapist safety;Necessary to address cognition/behavior during functional activity   OT goals addressed during session: ADL's and self-care;Strengthening/ROM      AM-PAC OT "6  Clicks" Daily Activity     Outcome Measure Help from another person eating meals?: A Lot Help from another person taking care of personal grooming?: A Lot Help from another person toileting, which includes using toliet, bedpan, or urinal?: Total Help from another person bathing (including washing, rinsing, drying)?: A Lot Help from another person  to put on and taking off regular upper body clothing?: A Lot Help from another person to put on and taking off regular lower body clothing?: Total 6 Click Score: 10   End of Session Equipment Utilized During Treatment: Gait belt Nurse Communication: Mobility status (nursing present)  Activity Tolerance: Patient tolerated treatment well Patient left: in bed;with call bell/phone within reach;with bed alarm set;with nursing/sitter in room  OT Visit Diagnosis: Unsteadiness on feet (R26.81);Other abnormalities of gait and mobility (R26.89);Muscle weakness (generalized) (M62.81)                Time: 7829-5621 OT Time Calculation (min): 20 min Charges:  OT General Charges $OT Visit: 1 Visit OT Evaluation $OT Eval Moderate Complexity: 1 Mod  Bradd Canary, OTR/L Acute Rehab Services Office: [phone]   Lorre Munroe 08/19/2022, 12:22 PM

## 2022-08-20 DIAGNOSIS — G9341 Metabolic encephalopathy: Secondary | ICD-10-CM | POA: Diagnosis not present

## 2022-08-20 LAB — BASIC METABOLIC PANEL
Anion gap: 11 (ref 5–15)
BUN: 11 mg/dL (ref 8–23)
CO2: 23 mmol/L (ref 22–32)
Calcium: 9.2 mg/dL (ref 8.9–10.3)
Chloride: 103 mmol/L (ref 98–111)
Creatinine, Ser: 1.21 mg/dL — ABNORMAL HIGH (ref 0.44–1.00)
GFR, Estimated: 44 mL/min — ABNORMAL LOW (ref >60)
Glucose, Bld: 82 mg/dL (ref 70–99)
Potassium: 4.1 mmol/L (ref 3.5–5.1)
Sodium: 137 mmol/L (ref 135–145)

## 2022-08-20 LAB — MAGNESIUM: Magnesium: 1.8 mg/dL (ref 1.7–2.4)

## 2022-08-20 MED ORDER — SODIUM CHLORIDE 0.9 % IV BOLUS
500.0000 mL | Freq: Once | INTRAVENOUS | Status: AC
  Administered 2022-08-20: 500 mL via INTRAVENOUS

## 2022-08-20 NOTE — Progress Notes (Signed)
PROGRESS NOTE    Ann Cole  ZOX:[id number] DOB: [birth year] DOA: 08/16/2022 PCP: Deeann Saint, MD   Brief Narrative:  82 year old with dementia, recent left wrist fracture, chronic CHF with preserved EF, paroxysmal A-fib not on anticoagulation due to increased risk of falls, hypothyroidism, depression comes to the hospital for evaluation of confusion and generalized weakness.  In the ER patient was noted to have urinary tract infection.  CT head and MRI brain were negative for acute pathology.  She was started on IV Rocephin.  Eventually cultures grew E. coli which was sensitive to Unasyn.   Assessment & Plan:  Principal Problem:   Acute metabolic encephalopathy Active Problems:   Chronic diastolic heart failure (HCC)   Paroxysmal atrial fibrillation (HCC)   Acquired hypothyroidism   Acute cystitis   Severe sepsis (HCC)   Hypothermia   Sepsis (HCC)      Acute metabolic encephalopathy in the setting of UTI Dementia: Likely from underlying infection but also has dementia.  Eventually her urine cultures grew E. coli which is sensitive to Unasyn therefore we will continue that.  It is not sensitive to Rocephin. Appears to be slightly more awake today but still overall confused   Severe sepsis due to UTI UTI: Sepsis physiology has improved.  Previous cultures reviewed which had shown Enterococcus faecalis.  Currently on empiric Rocephin and Unasyn. Follow culture data  Chronic CHF with preserved EF 65% back in June Severe pulmonary hypertension history: No obvious evidence of chest pain or respiratory distress at this time.  Continue to monitor.  Will resume home meds   PAF:  Beta-blocker on hold, will resume slowly .  Currently heart rate is in 60s not on anticoagulation due to fall risk   Hypothyroidism  continue Synthroid.  Elevated TSH, check free T4   DVT prophylaxis: SCDs Start: 08/16/22 2336 Code Status:   Code Status: Full Code Family Communication:  attempted to call [name] but no answer.  Remains weak and on IV antibiotics.  Continue hospital stay      Diet Orders (From admission, onward)     Start     Ordered   08/16/22 2337  Diet regular Room service appropriate? Yes; Fluid consistency: Thin  Diet effective now       Question Answer Comment  Room service appropriate? Yes   Fluid consistency: Thin      08/16/22 2336            Subjective: Seen and examined at bedside.  Able to track me across the room.  Answers in very basic sentences saying yes and no.  She was able to tell me her name and tell me she is in the hospital.  She also told me that she feels well today.  No complaints.  Examination:  Constitutional: Not in acute distress Respiratory: Clear to auscultation bilaterally Cardiovascular: Normal sinus rhythm, no rubs Abdomen: Nontender nondistended good bowel sounds Musculoskeletal: No edema noted Skin: No rashes seen Neurologic: CN 2-12 grossly intact.  And nonfocal Psychiatric: Poor judgment and insight.  Alert to name  Objective: Vitals:   08/19/22 2344 08/20/22 0416 08/20/22 0500 08/20/22 0859  BP: (!) 141/82 (!) 152/76  (!) 152/84  Pulse: 84 83  83  Resp: 18 18  18   Temp: 98.2 F (36.8 C) 97.7 F (36.5 C)  97.7 F (36.5 C)  TempSrc:    Axillary  SpO2: 96% 93%  97%  Weight:   91.2 kg   Height:  Intake/Output Summary (Last 24 hours) at 08/20/2022 1204 Last data filed at 08/19/2022 2346 Gross per 24 hour  Intake 120 ml  Output 350 ml  Net -230 ml   Filed Weights   08/18/22 0500 08/19/22 0426 08/20/22 0500  Weight: 93.4 kg 93.6 kg 91.2 kg    Scheduled Meds:  ferrous sulfate  325 mg Oral Q breakfast   levothyroxine  112 mcg Oral Q0600   memantine  5 mg Oral BID   sertraline  50 mg Oral q morning   Continuous Infusions:  ampicillin-sulbactam (UNASYN) IV 3 g (08/20/22 0537)    Nutritional status     Body mass index is 39.27 kg/m.  Data Reviewed:   CBC: Recent Labs  Lab  08/16/22 1828 08/17/22 0336  WBC 8.3 7.2  NEUTROABS 5.6 4.9  HGB 14.5 13.6  HCT 47.7* 43.5  MCV 96.4 91.4  PLT 240 233   Basic Metabolic Panel: Recent Labs  Lab 08/16/22 1828 08/17/22 0336 08/19/22 0306 08/20/22 0551  NA 138 138 139 137  K 4.7 4.5 3.8 4.1  CL 102 101 104 103  CO2 23 25 23 23   GLUCOSE 84 59* 81 82  BUN 21 20 12 11   CREATININE 1.07* 1.05* 1.07* 1.21*  CALCIUM 9.7 9.1 9.1 9.2  MG  --  1.8 1.9 1.8   GFR: Estimated Creatinine Clearance: 34.9 mL/min (A) (by C-G formula based on SCr of 1.21 mg/dL (H)). Liver Function Tests: Recent Labs  Lab 08/16/22 1828 08/17/22 0336  AST 32 27  ALT 27 23  ALKPHOS 142* 130*  BILITOT 0.3 0.4  PROT 7.5 6.8  ALBUMIN 3.3* 2.9*   No results for input(s): "LIPASE", "AMYLASE" in the last 168 hours. Recent Labs  Lab 08/18/22 0854  AMMONIA 31   Coagulation Profile: No results for input(s): "INR", "PROTIME" in the last 168 hours. Cardiac Enzymes: No results for input(s): "CKTOTAL", "CKMB", "CKMBINDEX", "TROPONINI" in the last 168 hours. BNP (last 3 results) No results for input(s): "PROBNP" in the last 8760 hours. HbA1C: No results for input(s): "HGBA1C" in the last 72 hours. CBG: Recent Labs  Lab 08/16/22 1843  GLUCAP 73   Lipid Profile: No results for input(s): "CHOL", "HDL", "LDLCALC", "TRIG", "CHOLHDL", "LDLDIRECT" in the last 72 hours. Thyroid Function Tests: Recent Labs    08/18/22 0854  FREET4 1.14*   Anemia Panel: Recent Labs    08/17/22 1642  VITAMINB12 827   Sepsis Labs: Recent Labs  Lab 08/16/22 1757 08/17/22 0611  LATICACIDVEN 2.1* 1.1    Recent Results (from the past 240 hour(s))  Urine Culture     Status: Abnormal   Collection Time: 08/16/22  6:25 PM   Specimen: Urine, Catheterized  Result Value Ref Range Status   Specimen Description URINE, CATHETERIZED  Final   Special Requests   Final    NONE Performed at [facility], [address] [place], [place] [address]     Culture (A)  Final    >=100,000 COLONIES/mL ESCHERICHIA COLI Confirmed Extended Spectrum Beta-Lactamase Producer (ESBL).  In bloodstream infections from ESBL organisms, carbapenems are preferred over piperacillin/tazobactam. They are shown to have a lower risk of mortality.    Report Status 08/19/2022 FINAL  Final   Organism ID, Bacteria ESCHERICHIA COLI (A)  Final      Susceptibility   Escherichia coli - MIC*    AMPICILLIN >=32 RESISTANT Resistant     CEFAZOLIN >=64 RESISTANT Resistant     CEFEPIME 0.5 SENSITIVE Sensitive  CEFTRIAXONE >=64 RESISTANT Resistant     CIPROFLOXACIN >=4 RESISTANT Resistant     GENTAMICIN <=1 SENSITIVE Sensitive     IMIPENEM <=0.25 SENSITIVE Sensitive     NITROFURANTOIN <=16 SENSITIVE Sensitive     TRIMETH/SULFA <=20 SENSITIVE Sensitive     AMPICILLIN/SULBACTAM <=2 SENSITIVE Sensitive     PIP/TAZO <=4 SENSITIVE Sensitive     * >=100,000 COLONIES/mL ESCHERICHIA COLI         Radiology Studies: No results found.         LOS: 4 days   Time spent= 35 mins    Margretta Zamorano Joline Maxcy, MD [facility]  If 7PM-7AM, please contact night-coverage  08/20/2022, 12:04 PM

## 2022-08-20 NOTE — Progress Notes (Signed)
Pharmacy Antibiotic Note  Ann Cole is a 82 y.o. female admitted on 08/16/2022 with UTI.  Pharmacy was consulted for Unasyn dosing on 08/17/22. Ceftriaxone per MD also started on 7/30 then later discontinued on 08/19/22.   Pt is afebrile.  WBC is WNL and lactic acid down from 2.1 to  WNL on 7/31.   Scr 1.21 today has trended up.  Current dose Unasyn is 3g IV q8 hours which remains appropriate for renal function, CrCl 34.9 ml/min.     Plan: Continue Unasyn 3g IV Q8H F/u renal fxn, C&S, clinical status De-escalate antibiotics as able  Height: 5' (152.4 cm) Weight: 91.2 kg (201 lb 1 oz) IBW/kg (Calculated) : 45.5  Temp (24hrs), Avg:98 F (36.7 C), Min:97.7 F (36.5 C), Max:98.2 F (36.8 C)  Recent Labs  Lab 08/16/22 1757 08/16/22 1828 08/17/22 0336 08/17/22 0611 08/19/22 0306 08/20/22 0551  WBC  --  8.3 7.2  --   --   --   CREATININE  --  1.07* 1.05*  --  1.07* 1.21*  LATICACIDVEN 2.1*  --   --  1.1  --   --     Estimated Creatinine Clearance: 34.9 mL/min (A) (by C-G formula based on SCr of 1.21 mg/dL (H)).    No Known Allergies  Antimicrobials this admission: Unasyn 7/31>> CTX 7/30>>8/2  Dose adjustments this admission: N/A  Microbiology results: 7/30 urine cx: > 100k colonies E. Coli ESBLl, but sensitive to unasyn, bactrim and cipro  Thank you for allowing pharmacy to be a part of this patient's care.  Noah Delaine, RPh Clinical Pharmacist 08/20/2022 11:15 AM

## 2022-08-20 NOTE — Plan of Care (Signed)

## 2022-08-21 DIAGNOSIS — G9341 Metabolic encephalopathy: Secondary | ICD-10-CM | POA: Diagnosis not present

## 2022-08-21 LAB — BASIC METABOLIC PANEL WITH GFR
Anion gap: 12 (ref 5–15)
BUN: 11 mg/dL (ref 8–23)
CO2: 22 mmol/L (ref 22–32)
Calcium: 9.4 mg/dL (ref 8.9–10.3)
Chloride: 100 mmol/L (ref 98–111)
Creatinine, Ser: 0.98 mg/dL (ref 0.44–1.00)
GFR, Estimated: 57 mL/min — ABNORMAL LOW (ref >60)
Glucose, Bld: 79 mg/dL (ref 70–99)
Potassium: 4.1 mmol/L (ref 3.5–5.1)
Sodium: 134 mmol/L — ABNORMAL LOW (ref 135–145)

## 2022-08-21 LAB — MAGNESIUM: Magnesium: 1.9 mg/dL (ref 1.7–2.4)

## 2022-08-21 MED ORDER — NITROFURANTOIN MONOHYD MACRO 100 MG PO CAPS
100.0000 mg | ORAL_CAPSULE | Freq: Two times a day (BID) | ORAL | Status: DC
  Administered 2022-08-21 – 2022-08-25 (×8): 100 mg via ORAL
  Filled 2022-08-21 (×8): qty 1

## 2022-08-21 NOTE — Progress Notes (Signed)
PROGRESS NOTE    Ann Cole  NWG:[id number] DOB: January 15, 1941 DOA: 08/16/2022 PCP: Deeann Saint, MD   Brief Narrative:  82 year old with dementia, recent left wrist fracture, chronic CHF with preserved EF, paroxysmal A-fib not on anticoagulation due to increased risk of falls, hypothyroidism, depression comes to the hospital for evaluation of confusion and generalized weakness.  In the ER patient was noted to have urinary tract infection.  CT head and MRI brain were negative for acute pathology.  She was started on IV Rocephin.  Eventually cultures grew E. coli which was sensitive to Unasyn.   Assessment & Plan:  Principal Problem:   Acute metabolic encephalopathy Active Problems:   Chronic diastolic heart failure (HCC)   Paroxysmal atrial fibrillation (HCC)   Acquired hypothyroidism   Acute cystitis   Severe sepsis (HCC)   Hypothermia   Sepsis (HCC)      Acute metabolic encephalopathy in the setting of UTI, improving Dementia: Mentation slowly improving.  Likely from underlying infection but also has dementia.  Eventually her urine cultures grew E. coli which is sensitive to Unasyn therefore we will continue that.  It is not sensitive to Rocephin.   Severe sepsis due to UTI UTI: Sepsis physiology has improved.  Previous cultures reviewed which had shown Enterococcus faecalis.  Currently on empiric Rocephin and Unasyn. Follow culture data  Chronic CHF with preserved EF 65% back in June Severe pulmonary hypertension history: No obvious evidence of chest pain or respiratory distress at this time.  Continue to monitor.  Will resume home meds   PAF:  Beta-blocker on hold, will resume slowly .  Currently heart rate is in 60s not on anticoagulation due to fall risk   Hypothyroidism  continue Synthroid.  Elevated TSH, check free T4   DVT prophylaxis: SCDs Start: 08/16/22 2336 Code Status:   Code Status: Full Code Family Communication:  Pending placement   Diet  Orders (From admission, onward)     Start     Ordered   08/16/22 2337  Diet regular Room service appropriate? Yes; Fluid consistency: Thin  Diet effective now       Question Answer Comment  Room service appropriate? Yes   Fluid consistency: Thin      08/16/22 2336            Subjective: Mentation slowly improving.  Able to answer basic questions without any complaints  Examination:  Constitutional: Not in acute distress Respiratory: Clear to auscultation bilaterally Cardiovascular: Normal sinus rhythm, no rubs Abdomen: Nontender nondistended good bowel sounds Musculoskeletal: No edema noted Skin: No rashes seen Neurologic: CN 2-12 grossly intact.  And nonfocal Psychiatric: Alert name and place.  Objective: Vitals:   08/20/22 1937 08/20/22 2356 08/21/22 0342 08/21/22 0736  BP: (!) 150/78 (!) 152/101 (!) 133/90 (!) 147/70  Pulse: 82 81 (!) 42 83  Resp: 19 19 19 20   Temp: 98 F (36.7 C) 98 F (36.7 C) 98.2 F (36.8 C) 97.8 F (36.6 C)  TempSrc:    Oral  SpO2: 96% 97% 90% 97%  Weight:      Height:        Intake/Output Summary (Last 24 hours) at 08/21/2022 1054 Last data filed at 08/20/2022 1613 Gross per 24 hour  Intake 100 ml  Output --  Net 100 ml   Filed Weights   08/18/22 0500 08/19/22 0426 08/20/22 0500  Weight: 93.4 kg 93.6 kg 91.2 kg    Scheduled Meds:  ferrous sulfate  325 mg Oral Q  breakfast   levothyroxine  112 mcg Oral Q0600   memantine  5 mg Oral BID   sertraline  50 mg Oral q morning   Continuous Infusions:  ampicillin-sulbactam (UNASYN) IV 3 g (08/21/22 0556)    Nutritional status     Body mass index is 39.27 kg/m.  Data Reviewed:   CBC: Recent Labs  Lab 08/16/22 1828 08/17/22 0336  WBC 8.3 7.2  NEUTROABS 5.6 4.9  HGB 14.5 13.6  HCT 47.7* 43.5  MCV 96.4 91.4  PLT 240 233   Basic Metabolic Panel: Recent Labs  Lab 08/16/22 1828 08/17/22 0336 08/19/22 0306 08/20/22 0551 08/21/22 0251  NA 138 138 139 137 134*  K 4.7  4.5 3.8 4.1 4.1  CL 102 101 104 103 100  CO2 23 25 23 23 22   GLUCOSE 84 59* 81 82 79  BUN 21 20 12 11 11   CREATININE 1.07* 1.05* 1.07* 1.21* 0.98  CALCIUM 9.7 9.1 9.1 9.2 9.4  MG  --  1.8 1.9 1.8 1.9   GFR: Estimated Creatinine Clearance: 43 mL/min (by C-G formula based on SCr of 0.98 mg/dL). Liver Function Tests: Recent Labs  Lab 08/16/22 1828 08/17/22 0336  AST 32 27  ALT 27 23  ALKPHOS 142* 130*  BILITOT 0.3 0.4  PROT 7.5 6.8  ALBUMIN 3.3* 2.9*   No results for input(s): "LIPASE", "AMYLASE" in the last 168 hours. Recent Labs  Lab 08/18/22 0854  AMMONIA 31   Coagulation Profile: No results for input(s): "INR", "PROTIME" in the last 168 hours. Cardiac Enzymes: No results for input(s): "CKTOTAL", "CKMB", "CKMBINDEX", "TROPONINI" in the last 168 hours. BNP (last 3 results) No results for input(s): "PROBNP" in the last 8760 hours. HbA1C: No results for input(s): "HGBA1C" in the last 72 hours. CBG: Recent Labs  Lab 08/16/22 1843  GLUCAP 73   Lipid Profile: No results for input(s): "CHOL", "HDL", "LDLCALC", "TRIG", "CHOLHDL", "LDLDIRECT" in the last 72 hours. Thyroid Function Tests: No results for input(s): "TSH", "T4TOTAL", "FREET4", "T3FREE", "THYROIDAB" in the last 72 hours.  Anemia Panel: No results for input(s): "VITAMINB12", "FOLATE", "FERRITIN", "TIBC", "IRON", "RETICCTPCT" in the last 72 hours.  Sepsis Labs: Recent Labs  Lab 08/16/22 1757 08/17/22 0611  LATICACIDVEN 2.1* 1.1    Recent Results (from the past 240 hour(s))  Urine Culture     Status: Abnormal   Collection Time: 08/16/22  6:25 PM   Specimen: Urine, Catheterized  Result Value Ref Range Status   Specimen Description URINE, CATHETERIZED  Final   Special Requests   Final    NONE Performed at [facility], [address], [place], [place] [address]    Culture (A)  Final    >=100,000 COLONIES/mL ESCHERICHIA COLI Confirmed Extended Spectrum Beta-Lactamase Producer (ESBL).  In  bloodstream infections from ESBL organisms, carbapenems are preferred over piperacillin/tazobactam. They are shown to have a lower risk of mortality.    Report Status 08/19/2022 FINAL  Final   Organism ID, Bacteria ESCHERICHIA COLI (A)  Final      Susceptibility   Escherichia coli - MIC*    AMPICILLIN >=32 RESISTANT Resistant     CEFAZOLIN >=64 RESISTANT Resistant     CEFEPIME 0.5 SENSITIVE Sensitive     CEFTRIAXONE >=64 RESISTANT Resistant     CIPROFLOXACIN >=4 RESISTANT Resistant     GENTAMICIN <=1 SENSITIVE Sensitive     IMIPENEM <=0.25 SENSITIVE Sensitive     NITROFURANTOIN <=16 SENSITIVE Sensitive     TRIMETH/SULFA <=20 SENSITIVE Sensitive  AMPICILLIN/SULBACTAM <=2 SENSITIVE Sensitive     PIP/TAZO <=4 SENSITIVE Sensitive     * >=100,000 COLONIES/mL ESCHERICHIA COLI         Radiology Studies: No results found.         LOS: 5 days   Time spent= 35 mins    Ann Iseman Joline Maxcy, MD [facility]  If 7PM-7AM, please contact night-coverage  08/21/2022, 10:54 AM

## 2022-08-22 ENCOUNTER — Other Ambulatory Visit (HOSPITAL_COMMUNITY): Payer: Self-pay

## 2022-08-22 DIAGNOSIS — G9341 Metabolic encephalopathy: Secondary | ICD-10-CM | POA: Diagnosis not present

## 2022-08-22 MED ORDER — NITROFURANTOIN MONOHYD MACRO 100 MG PO CAPS
100.0000 mg | ORAL_CAPSULE | Freq: Two times a day (BID) | ORAL | 0 refills | Status: DC
  Filled 2022-08-22: qty 10, 5d supply, fill #0

## 2022-08-22 NOTE — TOC Progression Note (Addendum)
Transition of Care [facility]) - Progression Note    Patient Details  Name: Ann Cole MRN: [id number] Date of Birth: [birth year]  Transition of Care [facility]) CM/SW Contact  Erin Sons, [place] Phone Number: 08/22/2022, 9:53 AM  Clinical Narrative:     CSW called Britta Mccreedy at [facility] [phone]. No answer; left voicemail requesting return call.  1030: CSW called [employer] main line providing update on pt's DC and is transferred to [employer].   10:45: CSW called [employer] again to get a fax number to send records. CSW is immediately transferred to [employer]. While leaving another voicemail, Britta Mccreedy answered. CSW explained pt is ready for DC and asked for a fax # where CSW could send fl2 and DC summary. Britta Mccreedy responded "somewhere else" and continued to explain that they cannot care for pt at their facility. She states that pt needs SNF. Pt has been non ambulatory since June 2024 and likely needing long term care if [facility] refuses to take pt back. CSW to involve [facility] leadership.   1110: Spoke with TOC leadership and informed that due to pt's change in ambulatory status from May/June, TOC will need to find SNF placement for pt.   1400: CSW called and updated pt's granddaughter. She explains she was not informed by pt's [facility] of this information. She is agreeable to SNF work-up. CSW completed fl2 and faxed bed requests in hub.   Expected Discharge Plan: Assisted Living Barriers to Discharge: Waiting on ALF to respond  Expected Discharge Plan and Services       Living arrangements for the past 2 months: Assisted Living Facility                                       Social Determinants of Health (SDOH) Interventions SDOH Screenings   Food Insecurity: Patient Unable To Answer (06/25/2022)  Housing: Patient Unable To Answer (06/25/2022)  Transportation Needs: Patient Unable To Answer (06/25/2022)  Utilities: Patient Unable To  Answer (06/25/2022)  Tobacco Use: Low Risk  (08/17/2022)    Readmission Risk Interventions     No data to display

## 2022-08-22 NOTE — NC FL2 (Addendum)
[place] MEDICAID FL2 LEVEL OF CARE FORM     IDENTIFICATION  Patient Name: Ann Cole Birthdate: Dec 02, 1940 Sex: female Admission Date (Current Location): 08/16/2022  [facility] and [place] Number:  Producer, television/film/video and Address:  [facility], [address], [place], [place] [address]      Provider Number: [id number]  Attending Physician Name and Address:  Dimple Nanas, MD  Relative Name and Phone Number:       Current Level of Care: Hospital Recommended Level of Care: SNF Prior Approval Number:    Date Approved/Denied:   PASRR Number:    [id number] Discharge Plan: SNF    Current Diagnoses: Patient Active Problem List   Diagnosis Date Noted   Severe sepsis (HCC) 08/17/2022   Hypothermia 08/17/2022   Sepsis (HCC) 08/17/2022   Acute metabolic encephalopathy 08/16/2022   Class 3 obesity (HCC) 06/21/2022   CHF (congestive heart failure) (HCC) 06/20/2022   Acute cystitis 04/27/2021   Acute on chronic anemia 04/27/2021   Humeral head fracture, right, closed, initial encounter 04/27/2021   Acquired hypothyroidism    Paroxysmal atrial fibrillation (HCC)    Chronic diastolic heart failure (HCC)    CAP (community acquired pneumonia) 04/26/2021   Syncope 08/23/2018   COVID-19 virus infection 08/23/2018   Dementia (HCC) 01/05/2018   Essential hypertension 01/05/2018    Orientation RESPIRATION BLADDER Height & Weight     Self  Normal Incontinent Weight: 208 lb 15.9 oz (94.8 kg) Height:  5' (152.4 cm)  BEHAVIORAL SYMPTOMS/MOOD NEUROLOGICAL BOWEL NUTRITION STATUS      Incontinent See DC summary  AMBULATORY STATUS COMMUNICATION OF NEEDS Skin   Extensive Assist Verbally Normal                       Personal Care Assistance Level of Assistance  Bathing, Feeding, Dressing Bathing Assistance: Maximum assistance Feeding assistance: Limited assistance Dressing Assistance: Maximum assistance     Functional Limitations  Info  Sight, Hearing, Speech Sight Info: Impaired Hearing Info: Adequate Speech Info: Adequate    SPECIAL CARE FACTORS FREQUENCY  PT (By licensed PT), OT (By licensed OT)     PT Frequency: 5x/week OT Frequency: 5/week            Contractures Contractures Info: Not present    Additional Factors Info  Code Status, Allergies Code Status Info: Full code Allergies Info: no known allergies           Current Medications (08/22/2022):  This is the current hospital active medication list Current Facility-Administered Medications  Medication Dose Route Frequency Provider Last Rate Last Admin   acetaminophen (TYLENOL) tablet 650 mg  650 mg Oral Q6H PRN Howerter, Justin B, DO   650 mg at 08/20/22 1603   Or   acetaminophen (TYLENOL) suppository 650 mg  650 mg Rectal Q6H PRN Howerter, Justin B, DO       ferrous sulfate tablet 325 mg  325 mg Oral Q breakfast Amin, Ankit Chirag, MD   325 mg at 08/22/22 0758   hydrALAZINE (APRESOLINE) injection 10 mg  10 mg Intravenous Q4H PRN Amin, Ankit Chirag, MD       ipratropium-albuterol (DUONEB) 0.5-2.5 (3) MG/3ML nebulizer solution 3 mL  3 mL Nebulization Q4H PRN Amin, Ankit Chirag, MD       levothyroxine (SYNTHROID) tablet 112 mcg  112 mcg Oral Q0600 Howerter, Justin B, DO   112 mcg at 08/22/22 0502   melatonin tablet 3 mg  3 mg Oral QHS PRN Howerter, Justin B, DO   3 mg at 08/21/22 2114   memantine (NAMENDA) tablet 5 mg  5 mg Oral BID Amin, Ankit Chirag, MD   5 mg at 08/22/22 0758   metoprolol tartrate (LOPRESSOR) injection 5 mg  5 mg Intravenous Q4H PRN Amin, Ankit Chirag, MD       nitrofurantoin (macrocrystal-monohydrate) (MACROBID) capsule 100 mg  100 mg Oral Q12H Amin, Ankit Chirag, MD   100 mg at 08/22/22 0758   ondansetron (ZOFRAN) injection 4 mg  4 mg Intravenous Q6H PRN Howerter, Justin B, DO       senna-docusate (Senokot-S) tablet 1 tablet  1 tablet Oral QHS PRN Amin, Ankit Chirag, MD       sertraline (ZOLOFT) tablet 50 mg  50 mg Oral q  morning Amin, Ankit Chirag, MD   50 mg at 08/22/22 0758   traZODone (DESYREL) tablet 50 mg  50 mg Oral QHS PRN Dimple Nanas, MD   50 mg at 08/18/22 2124     Discharge Medications: Please see discharge summary for a list of discharge medications.  Relevant Imaging Results:  Relevant Lab Results:   Additional Information SS#[id number] [place], [place]

## 2022-08-22 NOTE — Discharge Summary (Signed)
Physician Discharge Summary  Randine Askey ZOX:[id number] DOB: Jun 24, 1940 DOA: 08/16/2022  PCP: Deeann Saint, MD  Admit date: 08/16/2022 Discharge date: 08/22/2022  Admitted From: LTC Disposition:  SNF  Recommendations for Outpatient Follow-up:  Follow up with PCP in 1-2 weeks Please obtain BMP/CBC in one week your next doctors visit.  PO Macrobid for next 5 days Resume Lasix once her PO intake remains stable.    Discharge Condition: Stable CODE STATUS: Full Diet recommendation: Heart healthy  Brief/Interim Summary:  82 year old with dementia, recent left wrist fracture, chronic CHF with preserved EF, paroxysmal A-fib not on anticoagulation due to increased risk of falls, hypothyroidism, depression comes to the hospital for evaluation of confusion and generalized weakness.  In the ER patient was noted to have urinary tract infection.  CT head and MRI brain were negative for acute pathology.  She was started on IV Rocephin.  Eventually cultures grew E. coli which was sensitive to Unasyn.  Cultures reviewed, she was changed to [facility] after discussing with pharmacist.   Stable for dc as she is doing better.    Assessment & Plan:  Principal Problem:   Acute metabolic encephalopathy Active Problems:   Chronic diastolic heart failure (HCC)   Paroxysmal atrial fibrillation (HCC)   Acquired hypothyroidism   Acute cystitis   Severe sepsis (HCC)   Hypothermia   Sepsis (HCC)        Acute metabolic encephalopathy in the setting of UTI, improving Dementia: Mentation significantly improved..  Likely from underlying infection but also has dementia.  Eventually her urine cultures grew E. Coli, change to PO macrobid for 5 more days.   Severe sepsis due to UTI UTI: Sepsis physiology has improved. Abx as above   Chronic CHF with preserved EF 65% back in June Severe pulmonary hypertension history: No obvious evidence of chest pain or respiratory distress at this time.   Continue to monitor.  Will resume home meds   PAF:  Resume home meds   Hypothyroidism  continue Synthroid.    Discharge Diagnoses:  Principal Problem:   Acute metabolic encephalopathy Active Problems:   Chronic diastolic heart failure (HCC)   Paroxysmal atrial fibrillation (HCC)   Acquired hypothyroidism   Acute cystitis   Severe sepsis (HCC)   Hypothermia   Sepsis (HCC)      Consultations: None  Subjective: Feels well no complaints.  Interpreter used.   Discharge Exam: Vitals:   08/22/22 0336 08/22/22 0912  BP: 127/60 (!) 152/68  Pulse: 86 83  Resp: 16 20  Temp: 98.5 F (36.9 C) 97.7 F (36.5 C)  SpO2: 96% 96%   Vitals:   08/21/22 2326 08/22/22 0336 08/22/22 0337 08/22/22 0912  BP: 130/60 127/60  (!) 152/68  Pulse: 85 86  83  Resp: 18 16  20   Temp: 98.5 F (36.9 C) 98.5 F (36.9 C)  97.7 F (36.5 C)  TempSrc: Axillary Axillary  Oral  SpO2: 97% 96%  96%  Weight:   94.8 kg   Height:        General: Pt is alert, awake, not in acute distress Cardiovascular: RRR, S1/S2 +, no rubs, no gallops Respiratory: CTA bilaterally, no wheezing, no rhonchi Abdominal: Soft, NT, ND, bowel sounds + Extremities: no edema, no cyanosis  Discharge Instructions   Allergies as of 08/22/2022   No Known Allergies      Medication List     TAKE these medications    acetaminophen 500 MG tablet Commonly known as: TYLENOL Take 500 mg  by mouth in the morning, at noon, and at bedtime.   aspirin EC 325 MG tablet Take 1 tablet (325 mg total) by mouth daily.   Cholecalciferol 50 MCG (2000 UT) Caps Take 2,000 Units by mouth daily.   ferrous sulfate 325 (65 FE) MG tablet Take 1 tablet (325 mg total) by mouth daily with breakfast.   furosemide 40 MG tablet Commonly known as: LASIX Take 1 tablet (40 mg total) by mouth daily.   levothyroxine 112 MCG tablet Commonly known as: SYNTHROID Take 112 mcg by mouth daily.   magnesium hydroxide 400 MG/5ML  suspension Commonly known as: MILK OF MAGNESIA Take 30 mLs by mouth at bedtime as needed (constipation).   melatonin 5 MG Tabs Take 5 mg by mouth at bedtime.   memantine 5 MG tablet Commonly known as: NAMENDA Take 1 tablet (5 mg total) by mouth 2 (two) times daily.   metoprolol succinate 50 MG 24 hr tablet Commonly known as: TOPROL-XL Take 1 tablet (50 mg total) by mouth daily. Take with or immediately following a meal.   nitrofurantoin (macrocrystal-monohydrate) 100 MG capsule Commonly known as: MACROBID Take 1 capsule (100 mg total) by mouth every 12 (twelve) hours for 5 days.   sertraline 50 MG tablet Commonly known as: ZOLOFT Take 50 mg by mouth every morning.   spironolactone 25 MG tablet Commonly known as: ALDACTONE Take 1 tablet (25 mg total) by mouth daily.   vitamin C 250 MG tablet Commonly known as: ASCORBIC ACID Take 250 mg by mouth daily.        No Known Allergies  You were cared for by a hospitalist during your hospital stay. If you have any questions about your discharge medications or the care you received while you were in the hospital after you are discharged, you can call the unit and asked to speak with the hospitalist on call if the hospitalist that took care of you is not available. Once you are discharged, your primary care physician will handle any further medical issues. Please note that no refills for any discharge medications will be authorized once you are discharged, as it is imperative that you return to your primary care physician (or establish a relationship with a primary care physician if you do not have one) for your aftercare needs so that they can reassess your need for medications and monitor your lab values.  You were cared for by a hospitalist during your hospital stay. If you have any questions about your discharge medications or the care you received while you were in the hospital after you are discharged, you can call the unit and asked  to speak with the hospitalist on call if the hospitalist that took care of you is not available. Once you are discharged, your primary care physician will handle any further medical issues. Please note that NO REFILLS for any discharge medications will be authorized once you are discharged, as it is imperative that you return to your primary care physician (or establish a relationship with a primary care physician if you do not have one) for your aftercare needs so that they can reassess your need for medications and monitor your lab values.  Please request your Prim.MD to go over all Hospital Tests and Procedure/Radiological results at the follow up, please get all Hospital records sent to your Prim MD by signing hospital release before you go home.  Get CBC, CMP, 2 view Chest X ray checked  by Primary MD during your next visit  or SNF MD in 5-7 days ( we routinely change or add medications that can affect your baseline labs and fluid status, therefore we recommend that you get the mentioned basic workup next visit with your PCP, your PCP may decide not to get them or add new tests based on their clinical decision)  On your next visit with your primary care physician please Get Medicines reviewed and adjusted.  If you experience worsening of your admission symptoms, develop shortness of breath, life threatening emergency, suicidal or homicidal thoughts you must seek medical attention immediately by calling 911 or calling your MD immediately  if symptoms less severe.  You Must read complete instructions/literature along with all the possible adverse reactions/side effects for all the Medicines you take and that have been prescribed to you. Take any new Medicines after you have completely understood and accpet all the possible adverse reactions/side effects.   Do not drive, operate heavy machinery, perform activities at heights, swimming or participation in water activities or provide baby sitting services  if your were admitted for syncope or siezures until you have seen by Primary MD or a Neurologist and advised to do so again.  Do not drive when taking Pain medications.   Procedures/Studies: MR BRAIN WO CONTRAST  Result Date: 08/16/2022 CLINICAL DATA:  Initial evaluation for neuro deficit, stroke. EXAM: MRI HEAD WITHOUT CONTRAST TECHNIQUE: Multiplanar, multiecho pulse sequences of the brain and surrounding structures were obtained without intravenous contrast. COMPARISON:  CT from earlier the same day. FINDINGS: Brain: Moderately advanced cerebral atrophy, most pronounced about the frontal and temporal lobes bilaterally. Underlying mild-to-moderate chronic microvascular ischemic disease. No evidence for acute or subacute ischemia. Gray-white matter differentiation maintained. No areas of chronic cortical infarction. No acute or chronic intracranial blood products. No mass lesion, midline shift or mass effect. No hydrocephalus or extra-axial fluid collection. Empty sella noted. Vascular: Major intracranial vascular flow voids are maintained. Skull and upper cervical spine: Craniocervical junction within normal limits. Bone marrow signal intensity normal. No scalp soft tissue abnormality. Sinuses/Orbits: Globes orbital soft tissues within normal limits. Paranasal sinuses are largely clear. No significant mastoid effusion. Other: None. IMPRESSION: 1. No acute intracranial abnormality. 2. Moderate frontotemporal predominant cerebral atrophy. 3. Underlying mild-to-moderate chronic microvascular ischemic disease. 4. Empty sella. Electronically Signed   By: Rise Mu M.D.   On: 08/16/2022 23:10   CT Head Wo Contrast  Result Date: 08/16/2022 CLINICAL DATA:  Neuro deficit EXAM: CT HEAD WITHOUT CONTRAST TECHNIQUE: Contiguous axial images were obtained from the base of the skull through the vertex without intravenous contrast. RADIATION DOSE REDUCTION: This exam was performed according to the  departmental dose-optimization program which includes automated exposure control, adjustment of the mA and/or kV according to patient size and/or use of iterative reconstruction technique. COMPARISON:  CT brain 06/07/2022 FINDINGS: Brain: No acute territorial infarction, hemorrhage or intracranial mass. Moderate atrophy. Moderate chronic small vessel ischemic changes of the white matter. Stable ventricle size. Vascular: No hyperdense vessels.  Carotid vascular calcification Skull: Normal. Negative for fracture or focal lesion. Sinuses/Orbits: No acute finding. Other: None IMPRESSION: 1. No CT evidence for acute intracranial abnormality. 2. Atrophy and chronic small vessel ischemic changes of the white matter. Electronically Signed   By: Jasmine Pang M.D.   On: 08/16/2022 20:01   DG Chest Portable 1 View  Result Date: 08/16/2022 CLINICAL DATA:  Altered mental status EXAM: PORTABLE CHEST 1 VIEW COMPARISON:  06/27/2022 x-ray.  CTA 06/22/2022 FINDINGS: Stable enlarged cardiopericardial silhouette. Calcified tortuous aorta.  No consolidation, pneumothorax or effusion. No edema. Mild interstitial prominence. Overlapping cardiac leads. Chronic deformity of the right shoulder. Degenerative changes along the spine. IMPRESSION: Enlarged heart.  No consolidation or effusion. Electronically Signed   By: Karen Kays M.D.   On: 08/16/2022 18:37     The results of significant diagnostics from this hospitalization (including imaging, microbiology, ancillary and laboratory) are listed below for reference.     Microbiology: Recent Results (from the past 240 hour(s))  Urine Culture     Status: Abnormal   Collection Time: 08/16/22  6:25 PM   Specimen: Urine, Catheterized  Result Value Ref Range Status   Specimen Description URINE, CATHETERIZED  Final   Special Requests   Final    NONE Performed at [facility], [address] [place], [place] [address]    Culture (A)  Final    >=100,000 COLONIES/mL  ESCHERICHIA COLI Confirmed Extended Spectrum Beta-Lactamase Producer (ESBL).  In bloodstream infections from ESBL organisms, carbapenems are preferred over piperacillin/tazobactam. They are shown to have a lower risk of mortality.    Report Status 08/19/2022 FINAL  Final   Organism ID, Bacteria ESCHERICHIA COLI (A)  Final      Susceptibility   Escherichia coli - MIC*    AMPICILLIN >=32 RESISTANT Resistant     CEFAZOLIN >=64 RESISTANT Resistant     CEFEPIME 0.5 SENSITIVE Sensitive     CEFTRIAXONE >=64 RESISTANT Resistant     CIPROFLOXACIN >=4 RESISTANT Resistant     GENTAMICIN <=1 SENSITIVE Sensitive     IMIPENEM <=0.25 SENSITIVE Sensitive     NITROFURANTOIN <=16 SENSITIVE Sensitive     TRIMETH/SULFA <=20 SENSITIVE Sensitive     AMPICILLIN/SULBACTAM <=2 SENSITIVE Sensitive     PIP/TAZO <=4 SENSITIVE Sensitive     * >=100,000 COLONIES/mL ESCHERICHIA COLI     Labs: BNP (last 3 results) Recent Labs    06/07/22 1645 06/20/22 1105  BNP 373.1* 327.4*   Basic Metabolic Panel: Recent Labs  Lab 08/17/22 0336 08/19/22 0306 08/20/22 0551 08/21/22 0251 08/22/22 0723  NA 138 139 137 134* 136  K 4.5 3.8 4.1 4.1 4.0  CL 101 104 103 100 101  CO2 25 23 23 22 24   GLUCOSE 59* 81 82 79 72  BUN 20 12 11 11 12   CREATININE 1.05* 1.07* 1.21* 0.98 1.09*  CALCIUM 9.1 9.1 9.2 9.4 9.2  MG 1.8 1.9 1.8 1.9 1.9   Liver Function Tests: Recent Labs  Lab 08/16/22 1828 08/17/22 0336  AST 32 27  ALT 27 23  ALKPHOS 142* 130*  BILITOT 0.3 0.4  PROT 7.5 6.8  ALBUMIN 3.3* 2.9*   No results for input(s): "LIPASE", "AMYLASE" in the last 168 hours. Recent Labs  Lab 08/18/22 0854  AMMONIA 31   CBC: Recent Labs  Lab 08/16/22 1828 08/17/22 0336  WBC 8.3 7.2  NEUTROABS 5.6 4.9  HGB 14.5 13.6  HCT 47.7* 43.5  MCV 96.4 91.4  PLT 240 233   Cardiac Enzymes: No results for input(s): "CKTOTAL", "CKMB", "CKMBINDEX", "TROPONINI" in the last 168 hours. BNP: Invalid input(s):  "POCBNP" CBG: Recent Labs  Lab 08/16/22 1843  GLUCAP 73   D-Dimer No results for input(s): "DDIMER" in the last 72 hours. Hgb A1c No results for input(s): "HGBA1C" in the last 72 hours. Lipid Profile No results for input(s): "CHOL", "HDL", "LDLCALC", "TRIG", "CHOLHDL", "LDLDIRECT" in the last 72 hours. Thyroid function studies No results for input(s): "TSH", "T4TOTAL", "T3FREE", "THYROIDAB" in the last 72 hours.  Invalid input(s): "FREET3" Anemia work up No results for input(s): "VITAMINB12", "FOLATE", "FERRITIN", "TIBC", "IRON", "RETICCTPCT" in the last 72 hours. Urinalysis    Component Value Date/Time   COLORURINE YELLOW 08/16/2022 1825   APPEARANCEUR HAZY (A) 08/16/2022 1825   LABSPEC 1.006 08/16/2022 1825   PHURINE 5.0 08/16/2022 1825   GLUCOSEU NEGATIVE 08/16/2022 1825   HGBUR NEGATIVE 08/16/2022 1825   BILIRUBINUR NEGATIVE 08/16/2022 1825   KETONESUR NEGATIVE 08/16/2022 1825   PROTEINUR NEGATIVE 08/16/2022 1825   NITRITE POSITIVE (A) 08/16/2022 1825   LEUKOCYTESUR LARGE (A) 08/16/2022 1825   Sepsis Labs Recent Labs  Lab 08/16/22 1828 08/17/22 0336  WBC 8.3 7.2   Microbiology Recent Results (from the past 240 hour(s))  Urine Culture     Status: Abnormal   Collection Time: 08/16/22  6:25 PM   Specimen: Urine, Catheterized  Result Value Ref Range Status   Specimen Description URINE, CATHETERIZED  Final   Special Requests   Final    NONE Performed at [facility], [address], [place], [place] [address]    Culture (A)  Final    >=100,000 COLONIES/mL ESCHERICHIA COLI Confirmed Extended Spectrum Beta-Lactamase Producer (ESBL).  In bloodstream infections from ESBL organisms, carbapenems are preferred over piperacillin/tazobactam. They are shown to have a lower risk of mortality.    Report Status 08/19/2022 FINAL  Final   Organism ID, Bacteria ESCHERICHIA COLI (A)  Final      Susceptibility   Escherichia coli - MIC*    AMPICILLIN >=32 RESISTANT  Resistant     CEFAZOLIN >=64 RESISTANT Resistant     CEFEPIME 0.5 SENSITIVE Sensitive     CEFTRIAXONE >=64 RESISTANT Resistant     CIPROFLOXACIN >=4 RESISTANT Resistant     GENTAMICIN <=1 SENSITIVE Sensitive     IMIPENEM <=0.25 SENSITIVE Sensitive     NITROFURANTOIN <=16 SENSITIVE Sensitive     TRIMETH/SULFA <=20 SENSITIVE Sensitive     AMPICILLIN/SULBACTAM <=2 SENSITIVE Sensitive     PIP/TAZO <=4 SENSITIVE Sensitive     * >=100,000 COLONIES/mL ESCHERICHIA COLI     Time coordinating discharge:  I have spent 35 minutes face to face with the patient and on the ward discussing the patients care, assessment, plan and disposition with other care givers. >50% of the time was devoted counseling the patient about the risks and benefits of treatment/Discharge disposition and coordinating care.   SIGNED:   Dimple Nanas, MD  [facility] 08/22/2022, 10:21 AM   If 7PM-7AM, please contact night-coverage

## 2022-08-23 MED ORDER — SODIUM CHLORIDE 0.9 % IV BOLUS
500.0000 mL | Freq: Once | INTRAVENOUS | Status: AC
  Administered 2022-08-23: 500 mL via INTRAVENOUS

## 2022-08-23 NOTE — Progress Notes (Signed)
Discharge summary has been completed on 8/5 Initial intention was to transition patient back to her long-term care facility and over the weekend they are informed as the patient will be excepted back again on 8/5.  When case manager reached out back to the facility again they declined to accept her and they stated patient used to be ambulatory couple of months ago and now they have noted decline.  Requesting SNF therefore TOC working on this  Patient remained stable.  No complaints.  Eating breakfast with the help of nursing tech Vitals remained stable as well. She is not any acute distress, clear to auscultation bilaterally.  Normal sinus rhythm.  Plan is to keep her on oral Macrobid until 8/9.  Encourage oral hydration.  Stephania Fragmin MD [facility]

## 2022-08-23 NOTE — Progress Notes (Signed)
Physical Therapy Treatment Patient Details Name: Ann Cole MRN: [id number] DOB: 15-Jan-1941 Today's Date: 08/23/2022   History of Present Illness Pt is an 82 y/o female presenting 7/30 with acute metabolic encephalopathy and sepsis in setting of UTI. PMH: CHF, dementia, PAF, hypothyroidism, "recent" L wrist fx.    PT Comments  Patient received in bed, alert, not oriented. Lunch in front of her, but not currently eating and spoon in bed. She is agreeable to try mobility. In person interpreter utilized. She requires max to total A for all bed mobility at this time. Decreased initiation of movement even with cues and assist. Patient found to be soiled in bed once seated at edge of bed. Nursing notified. She continues to be limited with mobility due to confusion. Will continue to attempt mobility as able.       If plan is discharge home, recommend the following: Two people to help with walking and/or transfers;Two people to help with bathing/dressing/bathroom;Assistance with feeding;Assist for transportation;Direct supervision/assist for medications management;Direct supervision/assist for financial management   Can travel by private vehicle     No  Equipment Recommendations  None recommended by PT    Recommendations for Other Services       Precautions / Restrictions Precautions Precautions: Fall Restrictions Weight Bearing Restrictions: No Other Position/Activity Restrictions: recent L wrist fx per chart; obvious deformity to L wrist though no WB orders present     Mobility  Bed Mobility Overal bed mobility: Needs Assistance Bed Mobility: Supine to Sit, Sit to Supine     Supine to sit: Max assist, Total assist Sit to supine: Max assist, Total assist   General bed mobility comments: max to total assist for bed mobility. Poor initiation.    Transfers                   General transfer comment: Did not attempt without +2 assist available    Ambulation/Gait                General Gait Details: unable   Stairs             Wheelchair Mobility     Tilt Bed    Modified Rankin (Stroke Patients Only)       Balance   Sitting-balance support: Feet unsupported, No upper extremity supported Sitting balance-Leahy Scale: Good Sitting balance - Comments: able to sit edge of bed without assist once positioned there.                                    Cognition Arousal/Alertness: Awake/alert Behavior During Therapy: Flat affect Overall Cognitive Status: History of cognitive impairments - at baseline                                 General Comments: hx of dementia, not oriented, poor initiation of tasks        Exercises      General Comments        Pertinent Vitals/Pain Pain Assessment Pain Assessment: Faces Faces Pain Scale: Hurts a little bit Pain Location: R LE with movement Pain Descriptors / Indicators: Grimacing Pain Intervention(s): Monitored during session, Repositioned    Home Living                          Prior Function  PT Goals (current goals can now be found in the care plan section) Acute Rehab PT Goals PT Goal Formulation: Patient unable to participate in goal setting Time For Goal Achievement: 09/02/22 Progress towards PT goals: Not progressing toward goals - comment (cognition limited)    Frequency    Min 1X/week      PT Plan Current plan remains appropriate    Co-evaluation              AM-PAC PT "6 Clicks" Mobility   Outcome Measure  Help needed turning from your back to your side while in a flat bed without using bedrails?: Total Help needed moving from lying on your back to sitting on the side of a flat bed without using bedrails?: Total Help needed moving to and from a bed to a chair (including a wheelchair)?: Total Help needed standing up from a chair using your arms (e.g., wheelchair or bedside chair)?: Total Help  needed to walk in hospital room?: Total Help needed climbing 3-5 steps with a railing? : Total 6 Click Score: 6    End of Session   Activity Tolerance: Other (comment);Patient tolerated treatment well (cognition) Patient left: in bed;with call bell/phone within reach;with bed alarm set Nurse Communication: Mobility status;Other (comment) (patient found to be soiled in bed. Needs cleaning) PT Visit Diagnosis: Muscle weakness (generalized) (M62.81);Other abnormalities of gait and mobility (R26.89) Pain - Right/Left: Right Pain - part of body: Leg     Time: 1400-1418 PT Time Calculation (min) (ACUTE ONLY): 18 min  Charges:    $Therapeutic Activity: 8-22 mins PT General Charges $$ ACUTE PT VISIT: 1 Visit                     Domonik Levario, PT, GCS 08/23/22,2:29 PM

## 2022-08-23 NOTE — Plan of Care (Signed)
  Problem: Health Behavior/Discharge Planning: Goal: Ability to manage health-related needs will improve Outcome: Progressing   

## 2022-08-23 NOTE — TOC Progression Note (Signed)
Transition of Care [facility]) - Progression Note    Patient Details  Name: Ann Cole MRN: [id number] Date of Birth: 1940/10/07  [facility]) CM/SW Contact  Jovian Lembcke [place]is [place], [place] Phone Number: 08/23/2022, 4:13 PM  Clinical Narrative:     CSW called pt's granddaughter; left voicemail requesting return call.   Left list of bed offers with medicare star ratings in patient's room.  Grand daughter called back and provided email for list to be sent to. CSW emailed list to [email or web address]  Expected Discharge Plan: Skilled Nursing Facility Barriers to Discharge: No SNF bed  Expected Discharge Plan and Services       Living arrangements for the past 2 months: Assisted Living Facility Expected Discharge Date: 08/22/22                                     Social Determinants of Health (SDOH) Interventions SDOH Screenings   Food Insecurity: Patient Unable To Answer (06/25/2022)  Housing: Patient Unable To Answer (06/25/2022)  Transportation Needs: Patient Unable To Answer (06/25/2022)  Utilities: Patient Unable To Answer (06/25/2022)  Tobacco Use: Low Risk  (08/17/2022)    Readmission Risk Interventions     No data to display

## 2022-08-24 ENCOUNTER — Other Ambulatory Visit (HOSPITAL_COMMUNITY): Payer: Self-pay

## 2022-08-24 DIAGNOSIS — G9341 Metabolic encephalopathy: Secondary | ICD-10-CM | POA: Diagnosis not present

## 2022-08-24 NOTE — Care Management Important Message (Signed)
Important Message  Patient Details  Name: Ann Cole MRN: [id number] Date of Birth: [birth year]   Medicare Important Message Given:  Yes  Patient has a contact precaution order in place will mail copy to the home address.    Kolleen Ochsner 08/24/2022, 2:39 PM

## 2022-08-24 NOTE — Plan of Care (Signed)
  Problem: Health Behavior/Discharge Planning: Goal: Ability to manage health-related needs will improve Outcome: Progressing   

## 2022-08-24 NOTE — Progress Notes (Signed)
PROGRESS NOTE  Ann Cole  ZDG:[id number] DOB: 1941/01/03 DOA: 08/16/2022 PCP: Deeann Saint, MD   Brief Narrative: Patient is a 82 year old female with history of dementia, recent left wrist fracture, chronic CHF with preserved EF, paroxysmal A-fib not on anticoagulation who presented from memory care  due to increased history of fall, hypothyroidism, depression who presented with confusion, generalized weakness.  Suspected to have UTI.  CT head/MRI was negative for acute findings.  Started on ceftriaxone.  Urine culture showed E. coli sensitive to Unasyn.  PT recommending SNF on discharge.  TOC following.  Medically stable for discharge whenever possible  Assessment & Plan:  Principal Problem:   Acute metabolic encephalopathy Active Problems:   Chronic diastolic heart failure (HCC)   Paroxysmal atrial fibrillation (HCC)   Acquired hypothyroidism   Acute cystitis   Severe sepsis (HCC)   Hypothermia   Sepsis (HCC)  Acute metabolic encephalopathy secondary to UTI/dementia: History of dementia at baseline.  Presented with confusion more from baseline.  Currently back to baseline mental status.calm and cooperative. Initial confusion suspected to be from UTI. Continue frequent reorientation, delirium precautions  E. coli UTI: Currently on Macrobid.  Diastolic CHF/severe pulmonary hypertension: Currently hemodynamically stable.  Continue current medications  Paroxysmal A-fib: Currently in normal sinus rhythm.  Not on anticoagulation due to history of falls  Hypothyroidism: Continue Synthyroid  Morbid obesity: BMI of 39.9        DVT prophylaxis:SCDs Start: 08/16/22 2336     Code Status: Full Code  Family Communication: None at bedside  Patient status:Inpatient  Patient is from :memory care  Anticipated discharge RJ:[facility]ACZY care/SnF  Estimated DC date:whenever possible   Consultants: None  Procedures:None  Antimicrobials:  Anti-infectives (From  admission, onward)    Start     Dose/Rate Route Frequency Ordered Stop   08/22/22 0000  nitrofurantoin, macrocrystal-monohydrate, (MACROBID) 100 MG capsule       Note to Pharmacy: ok per Dr. Nelson Chimes to fill   100 mg Oral Every 12 hours 08/22/22 0824 08/27/22 2359   08/21/22 2200  nitrofurantoin (macrocrystal-monohydrate) (MACROBID) capsule 100 mg        100 mg Oral Every 12 hours 08/21/22 1521     08/19/22 1400  Ampicillin-Sulbactam (UNASYN) 3 g in sodium chloride 0.9 % 100 mL IVPB  Status:  Discontinued        3 g 200 mL/hr over 30 Minutes Intravenous Every 8 hours 08/19/22 1136 08/21/22 1525   08/17/22 1700  cefTRIAXone (ROCEPHIN) 1 g in sodium chloride 0.9 % 100 mL IVPB  Status:  Discontinued        1 g 200 mL/hr over 30 Minutes Intravenous Every 24 hours 08/16/22 2337 08/19/22 1135   08/17/22 1200  Ampicillin-Sulbactam (UNASYN) 3 g in sodium chloride 0.9 % 100 mL IVPB  Status:  Discontinued        3 g 200 mL/hr over 30 Minutes Intravenous Every 8 hours 08/17/22 1156 08/19/22 0836   08/16/22 1930  cefTRIAXone (ROCEPHIN) 1 g in sodium chloride 0.9 % 100 mL IVPB        1 g 200 mL/hr over 30 Minutes Intravenous  Once 08/16/22 1928 08/16/22 2016       Subjective: Patient seen and examined at bedside today.  Hemodynamically stable comfortable lying in bed.  Not agitated.  Alert and awake, follows commands.  Denies any complaints   Objective: Vitals:   08/24/22 0403 08/24/22 0500 08/24/22 0800 08/24/22 1133  BP: (!) 109/29  (!) 138/55 Marland Kitchen)  123/44  Pulse: 84  84 84  Resp: 19  16 17   Temp: 98.1 F (36.7 C)  98.1 F (36.7 C) (!) 97.5 F (36.4 C)  TempSrc: Oral  Oral Oral  SpO2: 96%  93% 96%  Weight:  92.8 kg    Height:        Intake/Output Summary (Last 24 hours) at 08/24/2022 1322 Last data filed at 08/24/2022 1045 Gross per 24 hour  Intake 621 ml  Output 250 ml  Net 371 ml   Filed Weights   08/20/22 0500 08/22/22 0337 08/24/22 0500  Weight: 91.2 kg 94.8 kg 92.8 kg     Examination:  General exam: Overall comfortable, not in distress,obese HEENT: PERRL Respiratory system:  no wheezes or crackles  Cardiovascular system: S1 & S2 heard, RRR.  Gastrointestinal system: Abdomen is nondistended, soft and nontender. Central nervous system: Alert and awake, pleasantly confused, obese, Extremities: No edema, no clubbing ,no cyanosis Skin: No rashes, no ulcers,no icterus     Data Reviewed: I have personally reviewed following labs and imaging studies  CBC: No results for input(s): "WBC", "NEUTROABS", "HGB", "HCT", "MCV", "PLT" in the last 168 hours. Basic Metabolic Panel: Recent Labs  Lab 08/20/22 0551 08/21/22 0251 08/22/22 0723 08/23/22 0226 08/24/22 0250  NA 137 134* 136 135 134*  K 4.1 4.1 4.0 3.7 4.2  CL 103 100 101 100 103  CO2 23 22 24 25  21*  GLUCOSE 82 79 72 74 81  BUN 11 11 12 14 12   CREATININE 1.21* 0.98 1.09* 1.05* 0.98  CALCIUM 9.2 9.4 9.2 9.0 8.9  MG 1.8 1.9 1.9 1.8 1.8     Recent Results (from the past 240 hour(s))  Urine Culture     Status: Abnormal   Collection Time: 08/16/22  6:25 PM   Specimen: Urine, Catheterized  Result Value Ref Range Status   Specimen Description URINE, CATHETERIZED  Final   Special Requests   Final    NONE Performed at [facility], [address], [place], [place] [address]    Culture (A)  Final    >=100,000 COLONIES/mL ESCHERICHIA COLI Confirmed Extended Spectrum Beta-Lactamase Producer (ESBL).  In bloodstream infections from ESBL organisms, carbapenems are preferred over piperacillin/tazobactam. They are shown to have a lower risk of mortality.    Report Status 08/19/2022 FINAL  Final   Organism ID, Bacteria ESCHERICHIA COLI (A)  Final      Susceptibility   Escherichia coli - MIC*    AMPICILLIN >=32 RESISTANT Resistant     CEFAZOLIN >=64 RESISTANT Resistant     CEFEPIME 0.5 SENSITIVE Sensitive     CEFTRIAXONE >=64 RESISTANT Resistant     CIPROFLOXACIN >=4 RESISTANT Resistant      GENTAMICIN <=1 SENSITIVE Sensitive     IMIPENEM <=0.25 SENSITIVE Sensitive     NITROFURANTOIN <=16 SENSITIVE Sensitive     TRIMETH/SULFA <=20 SENSITIVE Sensitive     AMPICILLIN/SULBACTAM <=2 SENSITIVE Sensitive     PIP/TAZO <=4 SENSITIVE Sensitive     * >=100,000 COLONIES/mL ESCHERICHIA COLI     Radiology Studies: No results found.  Scheduled Meds:  ferrous sulfate  325 mg Oral Q breakfast   levothyroxine  112 mcg Oral Q0600   memantine  5 mg Oral BID   nitrofurantoin (macrocrystal-monohydrate)  100 mg Oral Q12H   sertraline  50 mg Oral q morning   Continuous Infusions:   LOS: 8 days   Burnadette Pop, MD [facility] P8/07/2022, 1:22 PM

## 2022-08-24 NOTE — TOC Progression Note (Signed)
Transition of Care [facility]) - Progression Note    Patient Details  Name: Ann Cole MRN: [id number] Date of Birth: 1940-09-09  Transition [facility]) CM/SW Contact  Baldemar Lenis, [place] Phone Number: 08/24/2022, 3:40 PM  Clinical Narrative:   CSW spoke with granddaughter, [name], to discuss SNF offers. [name] provided top choices: [facility], [facility], [place], [place]. CSW contacted [facility] to discuss referral, [facility] needed to discuss with Administrator for approval for LTC placement. [facility] came back and rescinded bed offer, unable to take patient. CSW contacted [facility], discussed referral and need for LTC, [facility] to discuss with Production designer, theatre/television/film and will call CSW back. Awaiting call back from [facility]. CSW to follow.    Expected Discharge Plan: Skilled Nursing Facility Barriers to Discharge: No SNF bed  Expected Discharge Plan and Services       Living arrangements for the past 2 months: Assisted Living Facility Expected Discharge Date: 08/22/22                                     Social Determinants of Health (SDOH) Interventions SDOH Screenings   Food Insecurity: Patient Unable To Answer (06/25/2022)  Housing: Patient Unable To Answer (06/25/2022)  Transportation Needs: Patient Unable To Answer (06/25/2022)  Utilities: Patient Unable To Answer (06/25/2022)  Tobacco Use: Low Risk  (08/17/2022)    Readmission Risk Interventions     No data to display

## 2022-08-25 DIAGNOSIS — G9341 Metabolic encephalopathy: Secondary | ICD-10-CM | POA: Diagnosis not present

## 2022-08-25 MED ORDER — ENSURE ENLIVE PO LIQD: 237.0000 mL | Freq: Two times a day (BID) | ORAL | Status: DC

## 2022-08-25 NOTE — Progress Notes (Signed)
Initial Nutrition Assessment  DOCUMENTATION CODES:  Not applicable  INTERVENTION:  Continue regular diet Add automatic trays Ensure Enlive po BID, each supplement provides 350 kcal and 20 grams of protein.  Magic cup TID with meals, each supplement provides 290 kcal and 9 grams of protein  NUTRITION DIAGNOSIS:   Increased nutrient needs related to acute illness as evidenced by estimated needs.  GOAL:   Patient will meet greater than or equal to 90% of their needs  MONITOR:   PO intake, Supplement acceptance  REASON FOR ASSESSMENT:   Malnutrition Screening Tool    ASSESSMENT:   Pt with hx of HLD, HTN, atrial fibrillation, dementia, and CHF presented to ED from her memory care unit with AMS and weakness. Found to have a UTI and be hypothermic  RD working remotely. Pt A&O x 1, unable to provide a nutrition hx on the phone. Reviewed intake, overall pt's intake is poor but weight has been stable this admission.  Pt currently on a regular diet. Reached out to RN about pt's intake. States that pt ate ~15% of her meal this AM and is able to feed herself. Some poor intake seems to be related to dislike of food. Dentition is adequate. Will add nutrition supplements to augment intake and encourage adequate consumption of nutrients.  Average Meal Intake: 8/2-8/7: 26% intake x 8 recorded meals  Nutritionally Relevant Medications: Scheduled Meds:  ferrous sulfate  325 mg Oral Q breakfast   memantine  5 mg Oral BID   Labs Reviewed  NUTRITION - FOCUSED PHYSICAL EXAM: Defer to in-person assessment  Diet Order:   Diet Order             Diet regular Room service appropriate? Yes; Fluid consistency: Thin  Diet effective now                   EDUCATION NEEDS:   Not appropriate for education at this time  Skin:  Skin Assessment: Reviewed RN Assessment Stage 2 - left labia  Last BM:  8/6  Height:   Ht Readings from Last 1 Encounters:  08/17/22 5' (1.524 m)     Weight:   Wt Readings from Last 1 Encounters:  08/24/22 92.8 kg    Ideal Body Weight:  45.5 kg  BMI:  Body mass index is 39.96 kg/m.  Estimated Nutritional Needs:  Kcal:  1600-1800 kcal/d Protein:  75-90 Fluid:  >1.6L/d    Greig Castilla, RD, LDN Clinical Dietitian RD pager # available in [facility]  After hours/weekend pager # available in [facility]

## 2022-08-25 NOTE — Progress Notes (Signed)
Occupational Therapy Treatment Patient Details Name: Ann Cole MRN: [id number] DOB: [birth year] Today's Date: 08/25/2022   History of present illness Pt is an 82 y/o female presenting 7/30 with acute metabolic encephalopathy and sepsis in setting of UTI. PMH: CHF, dementia, PAF, hypothyroidism, "recent" L wrist fx.   OT comments  Pt with incremental progress towards goals. Focused session on self feeding abilities w/ initial Min needed fading to Setup assist. Pt observed managing meal well when staff member out of view. Guarded rehab potential due to baseline cognitive deficits but will continue to follow acutely.      If plan is discharge home, recommend the following:  Two people to help with walking and/or transfers;A lot of help with bathing/dressing/bathroom;Two people to help with bathing/dressing/bathroom;Assistance with feeding   Equipment Recommendations  None recommended by OT    Recommendations for Other Services      Precautions / Restrictions Precautions Precautions: Fall Restrictions Weight Bearing Restrictions: No Other Position/Activity Restrictions: recent L wrist fx per chart; obvious deformity to L wrist though no WB orders present       Mobility Bed Mobility Overal bed mobility: Needs Assistance             General bed mobility comments: Total A to reposition trunk for self feeding    Transfers                         Balance                                           ADL either performed or assessed with clinical judgement   ADL Overall ADL's : Needs assistance/impaired Eating/Feeding: Set up;Minimal assistance;Bed level Eating/Feeding Details (indicate cue type and reason): initially Min A to initiate; was observed to stab food without assist. increased initiation of breakfast self feeding when OT stepped outside of doorway where pt was observed to scoop food with fork then use fingers to pick up sausage  pieces.                                        Extremity/Trunk Assessment Upper Extremity Assessment Upper Extremity Assessment: LUE deficits/detail LUE Deficits / Details: L wrist deformity evident - no current brace or WB orders present LUE Coordination: decreased fine motor   Lower Extremity Assessment Lower Extremity Assessment: Defer to PT evaluation        Vision   Vision Assessment?: No apparent visual deficits   Perception     Praxis      Cognition Arousal: Alert Behavior During Therapy: Flat affect Overall Cognitive Status: History of cognitive impairments - at baseline                                 General Comments: hx of dementia, not oriented, poor initiation of tasks; often stares at staff members and requires assist to sequence tasks        Exercises      Shoulder Instructions       General Comments      Pertinent Vitals/ Pain       Pain Assessment Pain Assessment: No/denies pain  Home Living  Prior Functioning/Environment              Frequency  Min 1X/week        Progress Toward Goals  OT Goals(current goals can now be found in the care plan section)  Progress towards OT goals: OT to reassess next treatment  Acute Rehab OT Goals Patient Stated Goal: none stated OT Goal Formulation: Patient unable to participate in goal setting Time For Goal Achievement: 09/02/22 Potential to Achieve Goals: Fair ADL Goals Pt Will Perform Eating: with min assist;sitting;bed level Pt Will Perform Grooming: with min assist;bed level;sitting Additional ADL Goal #1: Pt to follow > 50% of one step commands during ADLs/transfers Additional ADL Goal #2: Pt to demo ability to maintain sitting balance > 10 min with no more than Supervision during functional tasks  Plan      Co-evaluation                 AM-PAC OT "6 Clicks" Daily Activity     Outcome  Measure   Help from another person eating meals?: A Little Help from another person taking care of personal grooming?: A Little Help from another person toileting, which includes using toliet, bedpan, or urinal?: Total Help from another person bathing (including washing, rinsing, drying)?: A Lot Help from another person to put on and taking off regular upper body clothing?: A Lot Help from another person to put on and taking off regular lower body clothing?: Total 6 Click Score: 12    End of Session    OT Visit Diagnosis: Unsteadiness on feet (R26.81);Other abnormalities of gait and mobility (R26.89);Muscle weakness (generalized) (M62.81)   Activity Tolerance Patient tolerated treatment well   Patient Left in bed;with call bell/phone within reach   Nurse Communication Mobility status;Other (comment) (self feeding)        Time: 1610-9604 OT Time Calculation (min): 17 min  Charges: OT General Charges $OT Visit: 1 Visit OT Treatments $Self Care/Home Management : 8-22 mins  Ann Cole, Ann Cole Acute Rehab Services Office: [phone]   Lorre Munroe 08/25/2022, 8:57 AM

## 2022-08-25 NOTE — TOC Transition Note (Signed)
Transition of Care [facility]) - CM/SW Discharge Note   Patient Details  Name: Haley Trescott MRN: [id number] Date of Birth: [birth year]  Transition of Care [facility]) CM/SW Contact:  Baldemar Lenis, LCSW Phone Number: 08/25/2022, 1:57 PM   Clinical Narrative:   CSW spoke with [facility] to discuss referral, they spoke with Administrator and are able to accept the patient. CSW updated granddaughter, she is in agreement. [facility] admit today. CSW updated MD, sent discharge information to [facility]. Transport arranged with [facility] for next available.  Nurse to call report to [phone].    Final next level of care: Skilled Nursing Facility Barriers to Discharge: Barriers Resolved   Patient Goals and CMS Choice      Discharge Placement                Patient chooses bed at: [facility] Patient to be transferred to facility by: [facility] Name of family member notified: [name] Patient and family notified of of transfer: 08/25/22  Discharge Plan and Services Additional resources added to the After Visit Summary for                                       Social Determinants of Health (SDOH) Interventions SDOH Screenings   Food Insecurity: Patient Unable To Answer (06/25/2022)  Housing: Patient Unable To Answer (06/25/2022)  Transportation Needs: Patient Unable To Answer (06/25/2022)  Utilities: Patient Unable To Answer (06/25/2022)  Tobacco Use: Low Risk  (08/17/2022)     Readmission Risk Interventions     No data to display

## 2022-08-25 NOTE — Progress Notes (Signed)
PROGRESS NOTE  Ann Cole  ZOX:[id number] DOB: 09-09-1940 DOA: 08/16/2022 PCP: Deeann Saint, MD   Brief Narrative: Patient is a 82 year old female with history of dementia, recent left wrist fracture, chronic CHF with preserved EF, paroxysmal A-fib not on anticoagulation who presented from memory care  due to increased history of fall, hypothyroidism, depression who presented with confusion, generalized weakness.  Suspected to have UTI.  CT head/MRI was negative for acute findings.  Started on ceftriaxone.  Urine culture showed E. coli sensitive to Unasyn.  She completed antibiotics course.  PT recommending SNF on discharge.  TOC following.  Medically stable for discharge whenever possible  Assessment & Plan:  Principal Problem:   Acute metabolic encephalopathy Active Problems:   Chronic diastolic heart failure (HCC)   Paroxysmal atrial fibrillation (HCC)   Acquired hypothyroidism   Acute cystitis   Severe sepsis (HCC)   Hypothermia   Sepsis (HCC)  Acute metabolic encephalopathy secondary to UTI/dementia: History of dementia at baseline.  Presented with confusion more from baseline.  Currently back to baseline mental status.calm and cooperative. Initial confusion suspected to be from UTI. Continue frequent reorientation, delirium precautions  E. coli UTI: Completed 7 days course of antibiotics  Diastolic CHF/severe pulmonary hypertension: Currently hemodynamically stable.  Continue current medications  Paroxysmal A-fib: Currently in normal sinus rhythm.  Not on anticoagulation due to history of falls  Hypothyroidism: Continue Synthyroid  Morbid obesity: BMI of 39.9   Nutrition Problem: Increased nutrient needs Etiology: acute illness Pressure Injury 08/24/22 [facility] Left;Medial Stage 2 -  Partial thickness loss of dermis presenting as a shallow open injury with a red, pink wound bed without slough. (Active)  08/24/22 1802  Location: [facility]  Location Orientation:  Left;Medial  Staging: Stage 2 -  Partial thickness loss of dermis presenting as a shallow open injury with a red, pink wound bed without slough.  Wound Description (Comments):   Present on Admission: No  Dressing Type None 08/24/22 1945    DVT prophylaxis:SCDs Start: 08/16/22 2336     Code Status: Full Code  Family Communication: Called and discussed with granddaughter on 8/8  Patient status:Inpatient  Patient is from :memory care  Anticipated discharge WJ:XBJYNW care/SnF  Estimated DC date:whenever possible   Consultants: None  Procedures:None  Antimicrobials:  Anti-infectives (From admission, onward)    Start     Dose/Rate Route Frequency Ordered Stop   08/22/22 0000  nitrofurantoin, macrocrystal-monohydrate, (MACROBID) 100 MG capsule       Note to Pharmacy: ok per Dr. Nelson Chimes to fill   100 mg Oral Every 12 hours 08/22/22 0824 08/27/22 2359   08/21/22 2200  nitrofurantoin (macrocrystal-monohydrate) (MACROBID) capsule 100 mg  Status:  Discontinued        100 mg Oral Every 12 hours 08/21/22 1521 08/25/22 1115   08/19/22 1400  Ampicillin-Sulbactam (UNASYN) 3 g in sodium chloride 0.9 % 100 mL IVPB  Status:  Discontinued        3 g 200 mL/hr over 30 Minutes Intravenous Every 8 hours 08/19/22 1136 08/21/22 1525   08/17/22 1700  cefTRIAXone (ROCEPHIN) 1 g in sodium chloride 0.9 % 100 mL IVPB  Status:  Discontinued        1 g 200 mL/hr over 30 Minutes Intravenous Every 24 hours 08/16/22 2337 08/19/22 1135   08/17/22 1200  Ampicillin-Sulbactam (UNASYN) 3 g in sodium chloride 0.9 % 100 mL IVPB  Status:  Discontinued        3 g 200 mL/hr over 30 Minutes  Intravenous Every 8 hours 08/17/22 1156 08/19/22 0836   08/16/22 1930  cefTRIAXone (ROCEPHIN) 1 g in sodium chloride 0.9 % 100 mL IVPB        1 g 200 mL/hr over 30 Minutes Intravenous  Once 08/16/22 1928 08/16/22 2016       Subjective: Patient seen and examined at bedside today.  Hemodynamically stable lying in bed.  No new  complaints, calm and cooperative.  Objective: Vitals:   08/24/22 2342 08/25/22 0434 08/25/22 0736 08/25/22 1107  BP: (!) 152/86 133/69 132/67 137/77  Pulse: 84 85 62 96  Resp: 18 16 20 18   Temp: 98.2 F (36.8 C) 98.7 F (37.1 C) 98.4 F (36.9 C) 99.3 F (37.4 C)  TempSrc: Axillary Axillary Axillary Axillary  SpO2: 96% 98% 94% 96%  Weight:      Height:        Intake/Output Summary (Last 24 hours) at 08/25/2022 1115 Last data filed at 08/25/2022 0925 Gross per 24 hour  Intake 120 ml  Output 500 ml  Net -380 ml   Filed Weights   08/20/22 0500 08/22/22 0337 08/24/22 0500  Weight: 91.2 kg 94.8 kg 92.8 kg    Examination:   General exam: Overall comfortable, not in distress,obese HEENT: PERRL Respiratory system:  no wheezes or crackles  Cardiovascular system: S1 & S2 heard, RRR.  Gastrointestinal system: Abdomen is nondistended, soft and nontender. Central nervous system: Alert and awake, pleasantly confused Extremities: No edema, no clubbing ,no cyanosis Skin: No rashes, no ulcers,no icterus     Data Reviewed: I have personally reviewed following labs and imaging studies  CBC: No results for input(s): "WBC", "NEUTROABS", "HGB", "HCT", "MCV", "PLT" in the last 168 hours. Basic Metabolic Panel: Recent Labs  Lab 08/21/22 0251 08/22/22 0723 08/23/22 0226 08/24/22 0250 08/25/22 0106  NA 134* 136 135 134* 135  K 4.1 4.0 3.7 4.2 3.8  CL 100 101 100 103 103  CO2 22 24 25  21* 22  GLUCOSE 79 72 74 81 84  BUN 11 12 14 12 9   CREATININE 0.98 1.09* 1.05* 0.98 0.85  CALCIUM 9.4 9.2 9.0 8.9 9.1  MG 1.9 1.9 1.8 1.8 1.8     Recent Results (from the past 240 hour(s))  Urine Culture     Status: Abnormal   Collection Time: 08/16/22  6:25 PM   Specimen: Urine, Catheterized  Result Value Ref Range Status   Specimen Description URINE, CATHETERIZED  Final   Special Requests   Final    NONE Performed at [facility], [address], [place], [place] [address]     Culture (A)  Final    >=100,000 COLONIES/mL ESCHERICHIA COLI Confirmed Extended Spectrum Beta-Lactamase Producer (ESBL).  In bloodstream infections from ESBL organisms, carbapenems are preferred over piperacillin/tazobactam. They are shown to have a lower risk of mortality.    Report Status 08/19/2022 FINAL  Final   Organism ID, Bacteria ESCHERICHIA COLI (A)  Final      Susceptibility   Escherichia coli - MIC*    AMPICILLIN >=32 RESISTANT Resistant     CEFAZOLIN >=64 RESISTANT Resistant     CEFEPIME 0.5 SENSITIVE Sensitive     CEFTRIAXONE >=64 RESISTANT Resistant     CIPROFLOXACIN >=4 RESISTANT Resistant     GENTAMICIN <=1 SENSITIVE Sensitive     IMIPENEM <=0.25 SENSITIVE Sensitive     NITROFURANTOIN <=16 SENSITIVE Sensitive     TRIMETH/SULFA <=20 SENSITIVE Sensitive     AMPICILLIN/SULBACTAM <=2 SENSITIVE Sensitive  PIP/TAZO <=4 SENSITIVE Sensitive     * >=100,000 COLONIES/mL ESCHERICHIA COLI     Radiology Studies: No results found.  Scheduled Meds:  feeding supplement  237 mL Oral BID BM   ferrous sulfate  325 mg Oral Q breakfast   levothyroxine  112 mcg Oral Q0600   memantine  5 mg Oral BID   sertraline  50 mg Oral q morning   Continuous Infusions:   LOS: 9 days   Burnadette Pop, MD [facility] P8/08/2022, 11:15 AM

## 2022-08-25 NOTE — Discharge Summary (Signed)
Physician Discharge Summary  Ann Cole HQI:[id number] DOB: 1940-10-28 DOA: 08/16/2022  PCP: Deeann Saint, MD  Admit date: 08/16/2022 Discharge date: 08/25/2022  Admitted From: SNF Disposition:  SnF  Discharge Condition:Stable CODE STATUS:FULL Diet recommendation: Regular  Brief/Interim Summary: Patient is a 82 year old female with history of dementia, recent left wrist fracture, chronic CHF with preserved EF, paroxysmal A-fib not on anticoagulation who presented from memory care  due to increased history of fall, hypothyroidism, depression who presented with confusion, generalized weakness.  Suspected to have UTI.  CT head/MRI was negative for acute findings.  Started on ceftriaxone.  Urine culture showed E. coli sensitive to Unasyn.  She completed antibiotics course.  PT recommending SNF on discharge.  TOC following.  Medically stable for discharge whenever possible   Following problems were addressed during the hospitalization:  Acute metabolic encephalopathy secondary to UTI/dementia: History of dementia at baseline.  Presented with confusion more from baseline.  Currently back to baseline mental status.calm and cooperative. Initial confusion suspected to be from UTI. Continue frequent reorientation, delirium precautions   E. coli UTI: Completed 7 days course of antibiotics   Diastolic CHF/severe pulmonary hypertension: Currently hemodynamically stable.  Her blood pressure stable.  She does not alcohol overloaded.  Last echo done on 06/22/2022 showed EF of 60 to 65%, no wall motion abnormality, severe elevated pulmonary artery systolic pressure.  She takes Lasix, aspirin, metoprolol   Paroxysmal A-fib: Currently in normal sinus rhythm.  Not on anticoagulation due to history of falls   Hypothyroidism: Continue Synthyroid   Morbid obesity: BMI of 39.9  Discharge Diagnoses:  Principal Problem:   Acute metabolic encephalopathy Active Problems:   Chronic diastolic heart  failure (HCC)   Paroxysmal atrial fibrillation (HCC)   Acquired hypothyroidism   Acute cystitis   Severe sepsis (HCC)   Hypothermia   Sepsis (HCC)    Discharge Instructions  Discharge Instructions     Diet - low sodium heart healthy   Complete by: As directed    Discharge instructions   Complete by: As directed    1)Please take your medications as instructed 2)Do a CBC and BMP tests in a week   Increase activity slowly   Complete by: As directed    No wound care   Complete by: As directed       Allergies as of 08/25/2022   No Known Allergies      Medication List     STOP taking these medications    aspirin EC 325 MG tablet   furosemide 40 MG tablet Commonly known as: LASIX   metoprolol succinate 50 MG 24 hr tablet Commonly known as: TOPROL-XL   spironolactone 25 MG tablet Commonly known as: ALDACTONE       TAKE these medications    acetaminophen 500 MG tablet Commonly known as: TYLENOL Take 500 mg by mouth in the morning, at noon, and at bedtime.   Cholecalciferol 50 MCG (2000 UT) Caps Take 2,000 Units by mouth daily.   ferrous sulfate 325 (65 FE) MG tablet Take 1 tablet (325 mg total) by mouth daily with breakfast.   levothyroxine 112 MCG tablet Commonly known as: SYNTHROID Take 112 mcg by mouth daily.   magnesium hydroxide 400 MG/5ML suspension Commonly known as: MILK OF MAGNESIA Take 30 mLs by mouth at bedtime as needed (constipation).   melatonin 5 MG Tabs Take 5 mg by mouth at bedtime.   memantine 5 MG tablet Commonly known as: NAMENDA Take 1 tablet (5 mg total)  by mouth 2 (two) times daily.   sertraline 50 MG tablet Commonly known as: ZOLOFT Take 50 mg by mouth every morning.   vitamin C 250 MG tablet Commonly known as: ASCORBIC ACID Take 250 mg by mouth daily.        No Known Allergies  Consultations: None   Procedures/Studies: MR BRAIN WO CONTRAST  Result Date: 08/16/2022 CLINICAL DATA:  Initial evaluation for  neuro deficit, stroke. EXAM: MRI HEAD WITHOUT CONTRAST TECHNIQUE: Multiplanar, multiecho pulse sequences of the brain and surrounding structures were obtained without intravenous contrast. COMPARISON:  CT from earlier the same day. FINDINGS: Brain: Moderately advanced cerebral atrophy, most pronounced about the frontal and temporal lobes bilaterally. Underlying mild-to-moderate chronic microvascular ischemic disease. No evidence for acute or subacute ischemia. Gray-white matter differentiation maintained. No areas of chronic cortical infarction. No acute or chronic intracranial blood products. No mass lesion, midline shift or mass effect. No hydrocephalus or extra-axial fluid collection. Empty sella noted. Vascular: Major intracranial vascular flow voids are maintained. Skull and upper cervical spine: Craniocervical junction within normal limits. Bone marrow signal intensity normal. No scalp soft tissue abnormality. Sinuses/Orbits: Globes orbital soft tissues within normal limits. Paranasal sinuses are largely clear. No significant mastoid effusion. Other: None. IMPRESSION: 1. No acute intracranial abnormality. 2. Moderate frontotemporal predominant cerebral atrophy. 3. Underlying mild-to-moderate chronic microvascular ischemic disease. 4. Empty sella. Electronically Signed   By: Rise Mu M.D.   On: 08/16/2022 23:10   CT Head Wo Contrast  Result Date: 08/16/2022 CLINICAL DATA:  Neuro deficit EXAM: CT HEAD WITHOUT CONTRAST TECHNIQUE: Contiguous axial images were obtained from the base of the skull through the vertex without intravenous contrast. RADIATION DOSE REDUCTION: This exam was performed according to the departmental dose-optimization program which includes automated exposure control, adjustment of the mA and/or kV according to patient size and/or use of iterative reconstruction technique. COMPARISON:  CT brain 06/07/2022 FINDINGS: Brain: No acute territorial infarction, hemorrhage or  intracranial mass. Moderate atrophy. Moderate chronic small vessel ischemic changes of the white matter. Stable ventricle size. Vascular: No hyperdense vessels.  Carotid vascular calcification Skull: Normal. Negative for fracture or focal lesion. Sinuses/Orbits: No acute finding. Other: None IMPRESSION: 1. No CT evidence for acute intracranial abnormality. 2. Atrophy and chronic small vessel ischemic changes of the white matter. Electronically Signed   By: Jasmine Pang M.D.   On: 08/16/2022 20:01   DG Chest Portable 1 View  Result Date: 08/16/2022 CLINICAL DATA:  Altered mental status EXAM: PORTABLE CHEST 1 VIEW COMPARISON:  06/27/2022 x-ray.  CTA 06/22/2022 FINDINGS: Stable enlarged cardiopericardial silhouette. Calcified tortuous aorta. No consolidation, pneumothorax or effusion. No edema. Mild interstitial prominence. Overlapping cardiac leads. Chronic deformity of the right shoulder. Degenerative changes along the spine. IMPRESSION: Enlarged heart.  No consolidation or effusion. Electronically Signed   By: Karen Kays M.D.   On: 08/16/2022 18:37      Subjective: Patient seen and examined at bedside today.  Hemodynamically stable.  Comfortable.  Medically stable for discharge. Discharge Exam: Vitals:   08/25/22 0736 08/25/22 1107  BP: 132/67 137/77  Pulse: 62 96  Resp: 20 18  Temp: 98.4 F (36.9 C) 99.3 F (37.4 C)  SpO2: 94% 96%   Vitals:   08/24/22 2342 08/25/22 0434 08/25/22 0736 08/25/22 1107  BP: (!) 152/86 133/69 132/67 137/77  Pulse: 84 85 62 96  Resp: 18 16 20 18   Temp: 98.2 F (36.8 C) 98.7 F (37.1 C) 98.4 F (36.9 C) 99.3 F (37.4 C)  TempSrc: Axillary Axillary Axillary Axillary  SpO2: 96% 98% 94% 96%  Weight:      Height:        General: Pt is alert, awake, not in acute distress,obese Cardiovascular: RRR, S1/S2 +, no rubs, no gallops Respiratory: CTA bilaterally, no wheezing, no rhonchi Abdominal: Soft, NT, ND, bowel sounds + Extremities: no edema, no  cyanosis    The results of significant diagnostics from this hospitalization (including imaging, microbiology, ancillary and laboratory) are listed below for reference.     Microbiology: Recent Results (from the past 240 hour(s))  Urine Culture     Status: Abnormal   Collection Time: 08/16/22  6:25 PM   Specimen: Urine, Catheterized  Result Value Ref Range Status   Specimen Description URINE, CATHETERIZED  Final   Special Requests   Final    NONE Performed at [facility], [address] [place], [place] [address]    Culture (A)  Final    >=100,000 COLONIES/mL ESCHERICHIA COLI Confirmed Extended Spectrum Beta-Lactamase Producer (ESBL).  In bloodstream infections from ESBL organisms, carbapenems are preferred over piperacillin/tazobactam. They are shown to have a lower risk of mortality.    Report Status 08/19/2022 FINAL  Final   Organism ID, Bacteria ESCHERICHIA COLI (A)  Final      Susceptibility   Escherichia coli - MIC*    AMPICILLIN >=32 RESISTANT Resistant     CEFAZOLIN >=64 RESISTANT Resistant     CEFEPIME 0.5 SENSITIVE Sensitive     CEFTRIAXONE >=64 RESISTANT Resistant     CIPROFLOXACIN >=4 RESISTANT Resistant     GENTAMICIN <=1 SENSITIVE Sensitive     IMIPENEM <=0.25 SENSITIVE Sensitive     NITROFURANTOIN <=16 SENSITIVE Sensitive     TRIMETH/SULFA <=20 SENSITIVE Sensitive     AMPICILLIN/SULBACTAM <=2 SENSITIVE Sensitive     PIP/TAZO <=4 SENSITIVE Sensitive     * >=100,000 COLONIES/mL ESCHERICHIA COLI     Labs: BNP (last 3 results) Recent Labs    06/07/22 1645 06/20/22 1105  BNP 373.1* 327.4*   Basic Metabolic Panel: Recent Labs  Lab 08/21/22 0251 08/22/22 0723 08/23/22 0226 08/24/22 0250 08/25/22 0106  NA 134* 136 135 134* 135  K 4.1 4.0 3.7 4.2 3.8  CL 100 101 100 103 103  CO2 22 24 25  21* 22  GLUCOSE 79 72 74 81 84  BUN 11 12 14 12 9   CREATININE 0.98 1.09* 1.05* 0.98 0.85  CALCIUM 9.4 9.2 9.0 8.9 9.1  MG 1.9 1.9 1.8 1.8 1.8   Liver  Function Tests: No results for input(s): "AST", "ALT", "ALKPHOS", "BILITOT", "PROT", "ALBUMIN" in the last 168 hours. No results for input(s): "LIPASE", "AMYLASE" in the last 168 hours. No results for input(s): "AMMONIA" in the last 168 hours. CBC: No results for input(s): "WBC", "NEUTROABS", "HGB", "HCT", "MCV", "PLT" in the last 168 hours. Cardiac Enzymes: No results for input(s): "CKTOTAL", "CKMB", "CKMBINDEX", "TROPONINI" in the last 168 hours. BNP: Invalid input(s): "POCBNP" CBG: No results for input(s): "GLUCAP" in the last 168 hours. D-Dimer No results for input(s): "DDIMER" in the last 72 hours. Hgb A1c No results for input(s): "HGBA1C" in the last 72 hours. Lipid Profile No results for input(s): "CHOL", "HDL", "LDLCALC", "TRIG", "CHOLHDL", "LDLDIRECT" in the last 72 hours. Thyroid function studies No results for input(s): "TSH", "T4TOTAL", "T3FREE", "THYROIDAB" in the last 72 hours.  Invalid input(s): "FREET3" Anemia work up No results for input(s): "VITAMINB12", "FOLATE", "FERRITIN", "TIBC", "IRON", "RETICCTPCT" in the last 72 hours. Urinalysis  Component Value Date/Time   COLORURINE YELLOW 08/16/2022 1825   APPEARANCEUR HAZY (A) 08/16/2022 1825   LABSPEC 1.006 08/16/2022 1825   PHURINE 5.0 08/16/2022 1825   GLUCOSEU NEGATIVE 08/16/2022 1825   HGBUR NEGATIVE 08/16/2022 1825   BILIRUBINUR NEGATIVE 08/16/2022 1825   KETONESUR NEGATIVE 08/16/2022 1825   PROTEINUR NEGATIVE 08/16/2022 1825   NITRITE POSITIVE (A) 08/16/2022 1825   LEUKOCYTESUR LARGE (A) 08/16/2022 1825   Sepsis Labs No results for input(s): "WBC" in the last 168 hours.  Invalid input(s): "PROCALCITONIN", "LACTICIDVEN" Microbiology Recent Results (from the past 240 hour(s))  Urine Culture     Status: Abnormal   Collection Time: 08/16/22  6:25 PM   Specimen: Urine, Catheterized  Result Value Ref Range Status   Specimen Description URINE, CATHETERIZED  Final   Special Requests   Final     NONE Performed at [facility], [address] [place], [place] [address]    Culture (A)  Final    >=100,000 COLONIES/mL ESCHERICHIA COLI Confirmed Extended Spectrum Beta-Lactamase Producer (ESBL).  In bloodstream infections from ESBL organisms, carbapenems are preferred over piperacillin/tazobactam. They are shown to have a lower risk of mortality.    Report Status 08/19/2022 FINAL  Final   Organism ID, Bacteria ESCHERICHIA COLI (A)  Final      Susceptibility   Escherichia coli - MIC*    AMPICILLIN >=32 RESISTANT Resistant     CEFAZOLIN >=64 RESISTANT Resistant     CEFEPIME 0.5 SENSITIVE Sensitive     CEFTRIAXONE >=64 RESISTANT Resistant     CIPROFLOXACIN >=4 RESISTANT Resistant     GENTAMICIN <=1 SENSITIVE Sensitive     IMIPENEM <=0.25 SENSITIVE Sensitive     NITROFURANTOIN <=16 SENSITIVE Sensitive     TRIMETH/SULFA <=20 SENSITIVE Sensitive     AMPICILLIN/SULBACTAM <=2 SENSITIVE Sensitive     PIP/TAZO <=4 SENSITIVE Sensitive     * >=100,000 COLONIES/mL ESCHERICHIA COLI    Please note: You were cared for by a hospitalist during your hospital stay. Once you are discharged, your primary care physician will handle any further medical issues. Please note that NO REFILLS for any discharge medications will be authorized once you are discharged, as it is imperative that you return to your primary care physician (or establish a relationship with a primary care physician if you do not have one) for your post hospital discharge needs so that they can reassess your need for medications and monitor your lab values.    Time coordinating discharge: 40 minutes  SIGNED:   Burnadette Pop, MD  [facility] 08/25/2022, 11:51 AM Pager [phone]  If 7PM-7AM, please contact night-coverage [email or web address] Password [email or web address]

## 2023-03-18 DEATH — deceased

## 2024-01-02 IMAGING — DX DG CHEST 1V PORT
1 series · 1 of 1 positions shown · non-contrast
Comparison: AP chest 08/23/2018

CLINICAL DATA: Shortness of breath. New diagnosis of pneumonia
found on outside chest radiograph today.

EXAM:
PORTABLE CHEST 1 VIEW

[chest ap]
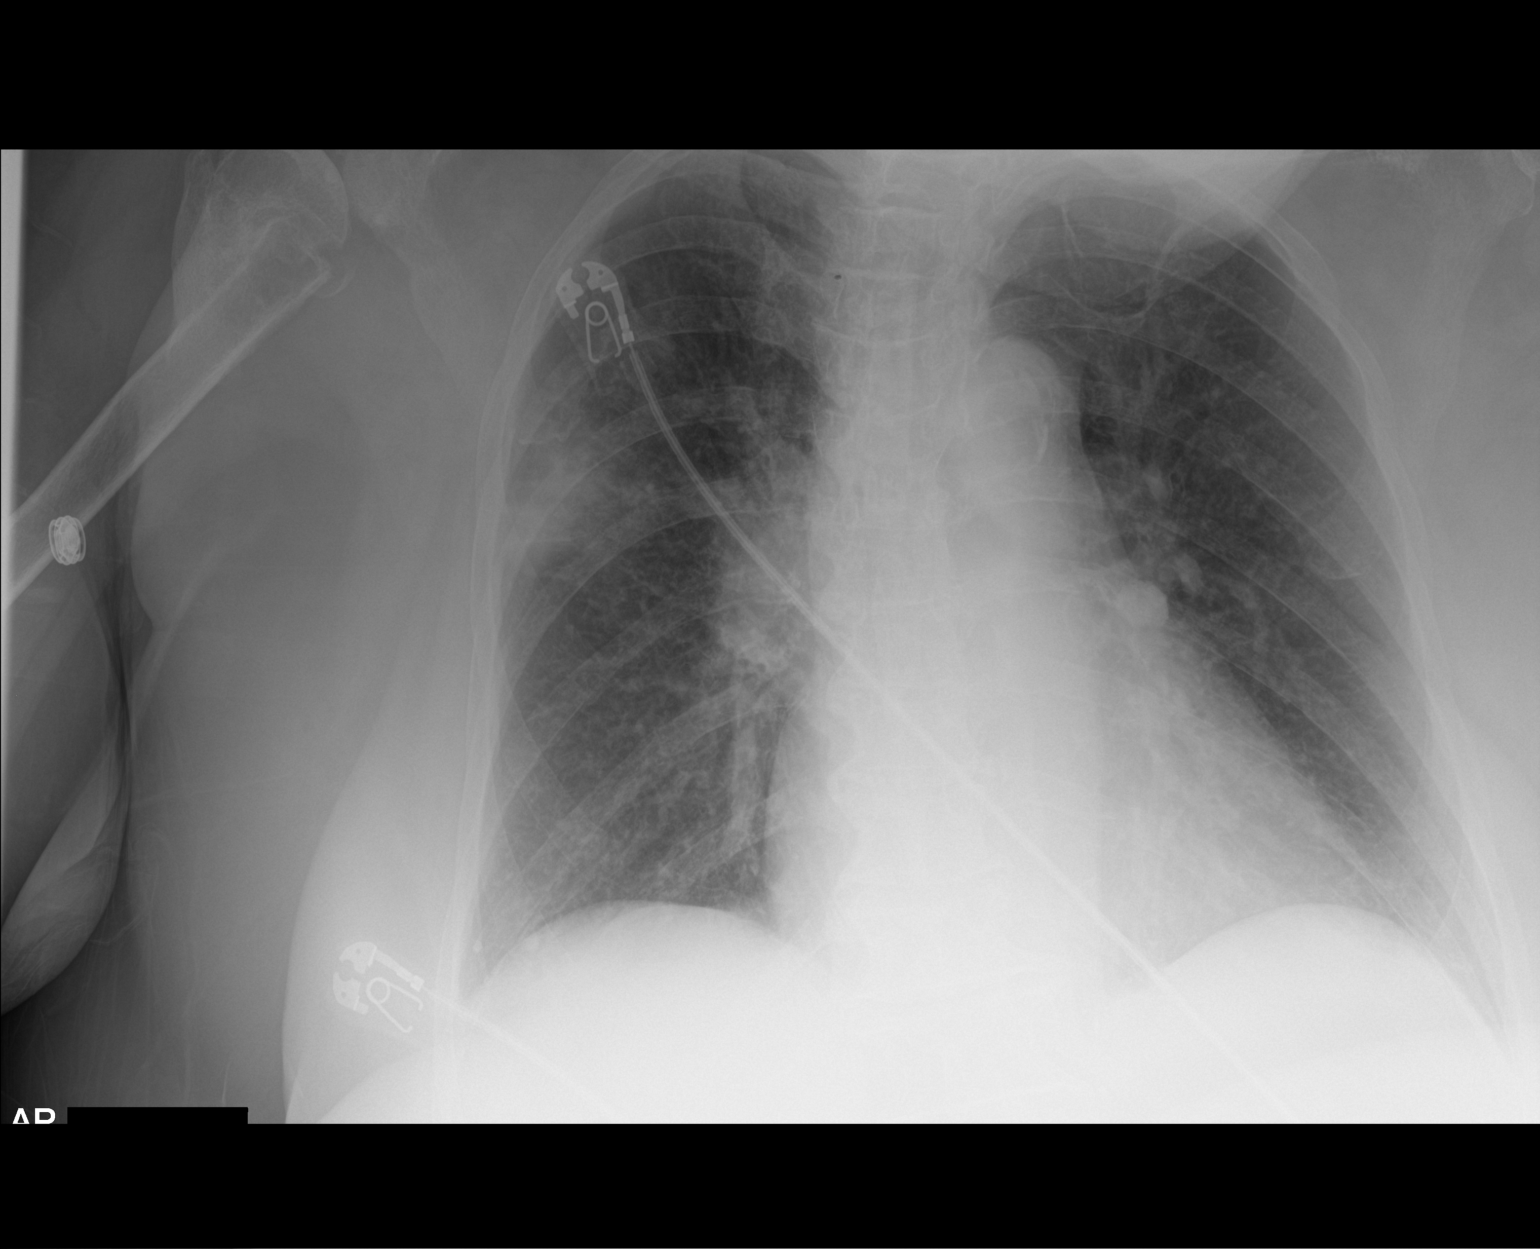

[1 of 1 positions shown; findings below may reference images not displayed]

FINDINGS: Cardiac silhouette is again mildly to moderately enlarged. Mild
calcification within aortic arch. Heterogeneous airspace opacity
within the lateral right midlung quadrant likely at the
inferolateral aspect of the right upper lobe, new from 08/23/2018
radiograph and CT. The left lung is clear. No pleural effusion or
pneumothorax.

New but chronic appearing displaced fracture of the right humeral
head-neck junction with moderate bone overlap and medial
displacement of the distal fracture component with respect to the
proximal fracture component.
IMPRESSION: :
IMPRESSION: 1. New inferolateral right upper lobe heterogeneous airspace opacity
suggesting pneumonia. Recommend follow-up radiographs 6 weeks
following treatment to ensure resolution.
2. New but subacute to chronic fracture of the right humeral
head-neck junction with moderate bone overlap and medial distal
fracture component displacement.
